# Patient Record
Sex: Male | Born: 1935 | Race: White | Hispanic: No | Marital: Married | Smoking: Never smoker
Health system: Southern US, Community
[De-identification: ages and names within clinical notes are randomized; demographics above are authoritative.]

## PROBLEM LIST (undated history)

## (undated) DIAGNOSIS — I509 Heart failure, unspecified: Secondary | ICD-10-CM

## (undated) DIAGNOSIS — Z95810 Presence of automatic (implantable) cardiac defibrillator: Secondary | ICD-10-CM

## (undated) DIAGNOSIS — I420 Dilated cardiomyopathy: Secondary | ICD-10-CM

## (undated) DIAGNOSIS — I2510 Atherosclerotic heart disease of native coronary artery without angina pectoris: Secondary | ICD-10-CM

## (undated) HISTORY — DX: Heart failure, unspecified: I50.9

## (undated) HISTORY — DX: Atherosclerotic heart disease of native coronary artery without angina pectoris: I25.10

## (undated) HISTORY — DX: Dilated cardiomyopathy: I42.0

## (undated) HISTORY — DX: Presence of automatic (implantable) cardiac defibrillator: Z95.810

---

## 2007-11-18 ENCOUNTER — Ambulatory Visit: Payer: Self-pay | Admitting: Cardiology

## 2007-11-19 HISTORY — PX: OTHER SURGICAL HISTORY: SHX169

## 2007-12-04 ENCOUNTER — Ambulatory Visit: Payer: Self-pay | Admitting: Cardiology

## 2007-12-09 ENCOUNTER — Ambulatory Visit: Payer: Self-pay | Admitting: Cardiology

## 2007-12-14 ENCOUNTER — Inpatient Hospital Stay (HOSPITAL_BASED_OUTPATIENT_CLINIC_OR_DEPARTMENT_OTHER): Admission: RE | Admit: 2007-12-14 | Discharge: 2007-12-14 | Payer: Self-pay | Admitting: Internal Medicine

## 2007-12-14 ENCOUNTER — Ambulatory Visit: Payer: Self-pay | Admitting: Internal Medicine

## 2007-12-17 ENCOUNTER — Ambulatory Visit: Payer: Self-pay | Admitting: Internal Medicine

## 2007-12-17 HISTORY — PX: CARDIAC CATHETERIZATION: SHX172

## 2007-12-17 LAB — CONVERTED CEMR LAB
Basophils Absolute: 0 10*3/uL (ref 0.0–0.1)
Chloride: 104 meq/L (ref 96–112)
Eosinophils Absolute: 0.5 10*3/uL (ref 0.0–0.6)
GFR calc Af Amer: 95 mL/min
GFR calc non Af Amer: 78 mL/min
HCT: 44.8 % (ref 39.0–52.0)
MCHC: 34.1 g/dL (ref 30.0–36.0)
MCV: 89.4 fL (ref 78.0–100.0)
Monocytes Absolute: 0.6 10*3/uL (ref 0.2–0.7)
Neutro Abs: 3.3 10*3/uL (ref 1.4–7.7)
Neutrophils Relative %: 44.8 % (ref 43.0–77.0)
Potassium: 4.2 meq/L (ref 3.5–5.1)
RBC: 5.01 M/uL (ref 4.22–5.81)
Sodium: 140 meq/L (ref 135–145)
aPTT: 30.9 s — ABNORMAL HIGH (ref 21.7–29.8)

## 2007-12-23 ENCOUNTER — Ambulatory Visit (HOSPITAL_COMMUNITY): Admission: RE | Admit: 2007-12-23 | Discharge: 2007-12-24 | Payer: Self-pay | Admitting: Internal Medicine

## 2007-12-23 ENCOUNTER — Ambulatory Visit: Payer: Self-pay | Admitting: Internal Medicine

## 2008-01-06 ENCOUNTER — Ambulatory Visit: Payer: Self-pay

## 2008-01-27 ENCOUNTER — Ambulatory Visit: Payer: Self-pay | Admitting: Internal Medicine

## 2008-02-05 ENCOUNTER — Ambulatory Visit: Payer: Self-pay | Admitting: Internal Medicine

## 2008-04-05 ENCOUNTER — Ambulatory Visit: Payer: Self-pay | Admitting: Internal Medicine

## 2008-07-05 ENCOUNTER — Ambulatory Visit: Payer: Self-pay | Admitting: Internal Medicine

## 2008-07-20 ENCOUNTER — Ambulatory Visit: Payer: Self-pay | Admitting: Cardiology

## 2008-10-06 ENCOUNTER — Ambulatory Visit: Payer: Self-pay | Admitting: Internal Medicine

## 2008-12-28 ENCOUNTER — Encounter: Payer: Self-pay | Admitting: Internal Medicine

## 2009-01-10 ENCOUNTER — Ambulatory Visit: Payer: Self-pay | Admitting: Internal Medicine

## 2009-04-11 ENCOUNTER — Ambulatory Visit: Payer: Self-pay | Admitting: Internal Medicine

## 2009-05-05 ENCOUNTER — Encounter: Payer: Self-pay | Admitting: Internal Medicine

## 2009-07-11 ENCOUNTER — Ambulatory Visit: Payer: Self-pay | Admitting: Internal Medicine

## 2009-07-26 ENCOUNTER — Encounter: Payer: Self-pay | Admitting: Internal Medicine

## 2009-10-10 ENCOUNTER — Ambulatory Visit: Payer: Self-pay | Admitting: Internal Medicine

## 2009-10-25 ENCOUNTER — Encounter: Payer: Self-pay | Admitting: Internal Medicine

## 2009-12-19 ENCOUNTER — Ambulatory Visit: Payer: Self-pay | Admitting: Internal Medicine

## 2009-12-19 DIAGNOSIS — Z95810 Presence of automatic (implantable) cardiac defibrillator: Secondary | ICD-10-CM | POA: Insufficient documentation

## 2009-12-19 DIAGNOSIS — I428 Other cardiomyopathies: Secondary | ICD-10-CM | POA: Insufficient documentation

## 2009-12-19 DIAGNOSIS — I5022 Chronic systolic (congestive) heart failure: Secondary | ICD-10-CM | POA: Insufficient documentation

## 2010-03-20 ENCOUNTER — Encounter: Payer: Self-pay | Admitting: Internal Medicine

## 2010-03-30 ENCOUNTER — Encounter: Payer: Self-pay | Admitting: Internal Medicine

## 2010-06-21 ENCOUNTER — Ambulatory Visit: Payer: Self-pay | Admitting: Internal Medicine

## 2010-06-25 ENCOUNTER — Encounter: Payer: Self-pay | Admitting: Internal Medicine

## 2010-09-20 ENCOUNTER — Ambulatory Visit: Payer: Self-pay | Admitting: Internal Medicine

## 2010-10-18 ENCOUNTER — Encounter (INDEPENDENT_AMBULATORY_CARE_PROVIDER_SITE_OTHER): Payer: Self-pay | Admitting: *Deleted

## 2010-12-18 NOTE — Cardiovascular Report (Signed)
Summary: Office Visit Remote   Office Visit Remote   Imported By: Roderic Ovens 06/25/2010 14:51:07  _____________________________________________________________________  External Attachment:    Type:   Image     Comment:   External Document

## 2010-12-18 NOTE — Cardiovascular Report (Signed)
Summary: Office Visit Remote   Office Visit Remote   Imported By: Roderic Ovens 04/06/2010 15:32:44  _____________________________________________________________________  External Attachment:    Type:   Image     Comment:   External Document

## 2010-12-18 NOTE — Letter (Signed)
Summary: Remote Device Check  [facility], Main Office  [address]   [place], [place] [address]   Phone: [phone]  Fax: [phone]     October 18, 2010 MRN: [id number]   [name] [address] [place], [place]  [address]   Dear Mr. FORTSON,   Your remote transmission was recieved and reviewed by your physician.  All diagnostics were within normal limits for you.  _____Your next transmission is scheduled for:                       .  Please transmit at any time this day.  If you have a wireless device your transmission will be sent automatically.  _X_____Your next office visit is scheduled for:February 2012 with Dr. Ladona Ridgel.                              . Please call our office to schedule an appointment.    Sincerely,  Altha Harm, LPN

## 2010-12-18 NOTE — Letter (Signed)
Summary: Remote Device Check  [facility], Main Office  [address]   [place], [place] [address]   Phone: [phone]  Fax: [phone]     Mar 30, 2010 MRN: [id number]   [name] [address] [place], [place]  [address]   Dear [name],   Your remote transmission was recieved and reviewed by your physician.  All diagnostics were within normal limits for you.  __X___Your next transmission is scheduled for:  06-21-10.  Please transmit at any time this day.  If you have a wireless device your transmission will be sent automatically.   Sincerely,  Vella Kohler

## 2010-12-18 NOTE — Assessment & Plan Note (Signed)
Summary: defib check.medtronic.amber  Medications Added ASPIRIN 81 MG TBEC (ASPIRIN) Take one tablet by mouth daily LISINOPRIL 5 MG TABS (LISINOPRIL) Take one tablet by mouth daily METOPROLOL TARTRATE 25 MG TABS (METOPROLOL TARTRATE) Take one tablet by mouth twice a day LEVOTHYROXINE SODIUM 50 MCG TABS (LEVOTHYROXINE SODIUM) Take one tablet by mouth once daily.      Allergies Added: NKDA  Visit Type:  Follow-up Primary Provider:  Fara Chute, MD  CC:  Sting in chaest at ICD area.  History of Present Illness: [name] returns today for followup.  He is a 77 yo man with a non-ischemic CM, class 2 CHF, and frequent PVC's.  He denies sob, peripheral edema or c/p.  No ICD shocks.  He has an occaisional stinging sensation in his left lower chest.  No other complaints.  Current Medications (verified): 1)  Aspirin 81 Mg Tbec (Aspirin) .... Take One Tablet By Mouth Daily 2)  Lisinopril 5 Mg Tabs (Lisinopril) .... Take One Tablet By Mouth Daily 3)  Metoprolol Tartrate 25 Mg Tabs (Metoprolol Tartrate) .... Take One Tablet By Mouth Twice A Day 4)  Levothyroxine Sodium 50 Mcg Tabs (Levothyroxine Sodium) .... Take One Tablet By Mouth Once Daily.  Allergies (verified): No Known Drug Allergies  Past History:  Past Surgical History: Last updated: 12/16/2008 S/P ICD 2009  Past Medical History: DCM Minimal CAD by catheterization Sinus bradycardia Class 2 CHF  IMPLANTATION OF DEFIBRILLATOR, HX OF (ICD-V45.02)    Review of Systems  The patient denies chest pain, syncope, dyspnea on exertion, and peripheral edema.    Vital Signs:  Patient profile:   77 year old male Height:      75 inches Weight:      170 pounds BMI:     21.33 Pulse rate:   60 / minute BP sitting:   124 / 70  (left arm)  Vitals Entered By: [name] CMA (December 19, 2009 4:00 PM)  Physical Exam  General:  Well developed, well nourished, in no acute distress.  HEENT: normal Neck: supple. No JVD. Carotids  2+ bilaterally no bruits Cor:I RRR no rubs, gallops or murmur Lungs: CTA. No wheezes, rales, or rhonchi. Ab: soft, nontender. nondistended. No HSM. Good bowel sounds Ext: warm. no cyanosis, clubbing or edema Neuro: alert and oriented. Grossly nonfocal. affect pleasant     ICD Specifications Following MD:  Eric Bunting, MD     ICD Vendor:  Medtronic     ICD Model Number:  D154ATG     ICD Serial Number:  [id number] H ICD DOI:  12/23/2007      Lead 1:    Location: RA     DOI: 12/23/2007     Model #: [id number]     Serial #: [id number]     Status: active Lead 2:    Location: RV     DOI: 12/23/2007     Model #: [id number]     Serial #: [id number]     Status: active  Indications::  NICM  Explantation Comments: Carelink  ICD Follow Up Remote Check?  No Battery Voltage:  3.13 V     Charge Time:  9.1 seconds     Underlying rhythm:  SR/PVC's ICD Dependent:  No       ICD Device Measurements Atrium:  Amplitude: 1.7 mV, Impedance: 592 ohms, Threshold: 1.0 V at 0.4 msec Right Ventricle:  Amplitude: 4.1 mV, Impedance: 456 ohms, Threshold: 0.75 V at 0.4 msec Shock Impedance: 43/54 ohms   Episodes MS  Episodes:  0     Percent Mode Switch:  0     Coumadin:  No Shock:  0     ATP:  0     Nonsustained:  19     Atrial Pacing:  68.8%     Ventricular Pacing:  3.5%  Brady Parameters Mode DDDR+     Lower Rate Limit:  60     Upper Rate Limit 120 PAV 180     Sensed AV Delay:  150  Tachy Zones VF:  200     VT:  176     Next Remote Date:  03/20/2010     Next Cardiology Appt Due:  12/26/2010 Tech Comments:   FFRW noted, atrial sensitivity reprogrammed 0.30mV.  Carelink transmissions every 3 months ROV 1 year with Dr. Ladona Harmon. Eric Harm, LPN  December 19, 2009 4:21 PM   Impression & Recommendations:  Problem # 1:  IMPLANTATION OF DEFIBRILLATOR, HX OF (ICD-V45.02) Assessment Comment Only His device is working normally.  Continue followup in several months.  Problem # 2:  CHRONIC SYSTOLIC HEART FAILURE  (ICD-428.22) He is class 2 and is on a good medical regimen. I have asked that he reduce his sodium intake. His updated medication list for this problem includes:    Aspirin 81 Mg Tbec (Aspirin) .Marland Kitchen... Take one tablet by mouth daily    Lisinopril 5 Mg Tabs (Lisinopril) .Marland Kitchen... Take one tablet by mouth daily    Metoprolol Tartrate 25 Mg Tabs (Metoprolol tartrate) .Marland Kitchen... Take one tablet by mouth twice a day

## 2010-12-18 NOTE — Letter (Signed)
Summary: Remote Device Check  [facility], Main Office  [address]   [place], [place] [address]   Phone: [phone]  Fax: [phone]     June 25, 2010 MRN: [id number]   TICO CROTTEAU [address] [place], [place]  [address]   Dear Mr. MCNEAL,   Your remote transmission was recieved and reviewed by your physician.  All diagnostics were within normal limits for you.  __X___Your next transmission is scheduled for: 09-20-2010.  Please transmit at any time this day.  If you have a wireless device your transmission will be sent automatically.   Sincerely,  Vella Kohler

## 2010-12-25 ENCOUNTER — Encounter (INDEPENDENT_AMBULATORY_CARE_PROVIDER_SITE_OTHER): Payer: Medicare Other | Admitting: Internal Medicine

## 2010-12-25 ENCOUNTER — Encounter: Payer: Self-pay | Admitting: Internal Medicine

## 2010-12-25 DIAGNOSIS — I5022 Chronic systolic (congestive) heart failure: Secondary | ICD-10-CM

## 2010-12-25 DIAGNOSIS — I472 Ventricular tachycardia, unspecified: Secondary | ICD-10-CM

## 2010-12-25 DIAGNOSIS — I10 Essential (primary) hypertension: Secondary | ICD-10-CM

## 2010-12-25 DIAGNOSIS — I4729 Other ventricular tachycardia: Secondary | ICD-10-CM

## 2011-01-03 NOTE — Assessment & Plan Note (Signed)
Summary: DEFIB CHECK=MJ      Allergies Added: NKDA  Visit Type:  Follow-up Primary Provider:  Fara Chute, MD   History of Present Illness: [name] returns today for followup.  He is a 77 yo man with a non-ischemic CM, class 2 CHF, and frequent PVC's.  He denies sob, peripheral edema or c/p.  No ICD shocks.  He initially had a large pocket hematoma after ICD implant which resolved.   No other complaints. He has been able to perform his usual activities without difficulty.  Allergies (verified): No Known Drug Allergies  Past History:  Past Medical History: Last updated: 12/19/2009 DCM Minimal CAD by catheterization Sinus bradycardia Class 2 CHF  IMPLANTATION OF DEFIBRILLATOR, HX OF (ICD-V45.02)    Past Surgical History: Last updated: Jan 09, 2009 S/P ICD 2009  Family History: Last updated: 09-Jan-2009 Mother died at 45  Father died at 70 of TB  Social History: Last updated: 09-Jan-2009 Married Retired No ETOH or Tobacco  Review of Systems  The patient denies chest pain, syncope, dyspnea on exertion, and peripheral edema.    Vital Signs:  Patient profile:   77 year old male Height:      75 inches Weight:      173 pounds BMI:     21.70 Pulse rate:   73 / minute BP sitting:   162 / 88  (left arm)  Vitals Entered By: Laurance Flatten CMA (December 25, 2010 10:26 AM)  Physical Exam  General:  Well developed, well nourished, in no acute distress.  HEENT: normal Neck: supple. No JVD. Carotids 2+ bilaterally no bruits Cor:I RRR no rubs, gallops or murmur Lungs: CTA. No wheezes, rales, or rhonchi. Ab: soft, nontender. nondistended. No HSM. Good bowel sounds Ext: warm. no cyanosis, clubbing or edema Neuro: alert and oriented. Grossly nonfocal. affect pleasant     ICD Specifications Following MD:  Lewayne Bunting, MD     ICD Vendor:  Medtronic     ICD Model Number:  D154ATG     ICD Serial Number:  [id number] H ICD DOI:  12/23/2007      Lead 1:    Location: RA      DOI: 12/23/2007     Model #: 5076     Serial #: [id number]     Status: active Lead 2:    Location: RV     DOI: 12/23/2007     Model #: 1308     Serial #: [id number]     Status: active  Indications::  NICM  Explantation Comments: Carelink  ICD Follow Up Battery Voltage:  3.02 V     Charge Time:  9.7 seconds     Underlying rhythm:  SB  ICD Dependent:  No       ICD Device Measurements Atrium:  Amplitude: 1.8 mV, Impedance: 504 ohms, Threshold: 0.5 V at 0.40 msec Right Ventricle:  Amplitude: 3.8 mV, Impedance: 416 ohms, Threshold: 0.5 V at 0.40 msec Shock Impedance: 44/53 ohms   Episodes MS Episodes:  0     Coumadin:  No Shock:  0     ATP:  0     Nonsustained:  9     Atrial Therapies:  0 Atrial Pacing:  75.5%     Ventricular Pacing:  1.7%  Brady Parameters Mode MVP     Lower Rate Limit:  60     Upper Rate Limit 120 PAV 180     Sensed AV Delay:  150  Tachy Zones VF:  200  VT:  176     Next Remote Date:  03/28/2011     Next Cardiology Appt Due:  12/20/2011 Tech Comments:  9 NST EPISODES--ALL 1 SECOND LONG. NORMAL DEVICE FUNCTION.  NO CHANGES MADE. CARELINK 03-28-11 AND ROV W/GT IN 12 MTHS. Vella Kohler  December 25, 2010 10:31 AM MD Comments:  Agree with above.  Impression & Recommendations:  Problem # 1:  IMPLANTATION OF DEFIBRILLATOR, HX OF (ICD-V45.02) His device is working normally. Will recheck in several months.  Problem # 2:  CHRONIC SYSTOLIC HEART FAILURE (ICD-428.22) His symptoms remain class 2. I have asked him to continue his current meds and maintain a low sodium diet. His updated medication list for this problem includes:    Aspirin 81 Mg Tbec (Aspirin) .Marland Kitchen... Take one tablet by mouth daily    Lisinopril 5 Mg Tabs (Lisinopril) .Marland Kitchen... Take one tablet by mouth daily    Metoprolol Tartrate 25 Mg Tabs (Metoprolol tartrate) .Marland Kitchen... Take one tablet by mouth twice a day  Patient Instructions: 1)  Your physician wants you to follow-up in: 12 months with Dr Court Joy will  receive a reminder letter in the mail two months in advance. If you don't receive a letter, please call our office to schedule the follow-up appointment. 2)  Your physician recommends that you continue on your current medications as directed. Please refer to the Current Medication list given to you today.

## 2011-01-24 NOTE — Cardiovascular Report (Signed)
Summary: Office Visit   Office Visit   Imported By: Roderic Ovens 01/14/2011 16:01:46  _____________________________________________________________________  External Attachment:    Type:   Image     Comment:   External Document

## 2011-02-04 ENCOUNTER — Telehealth: Payer: Self-pay | Admitting: Internal Medicine

## 2011-02-14 NOTE — Progress Notes (Signed)
Summary: Calling about paper work   Phone Note Call from Patient Call back at [employer] [phone]   Caller: Patient Summary of Call: Calling about having letter written to his insurance company Initial call taken by: Judie Grieve,  February 04, 2011 10:46 AM  Follow-up for Phone Call        spoke with pt One week ago pt he filed for more life insurance and was turned down.  Said that on 12/25/2010 he had CHF in our office.  I explained to pt that this is all part of his disease process.  his heart is weak and that is why he has as ICD.  He verbalized understanding. Eric Bast, RN, BSN  February 04, 2011 1:35 PM

## 2011-03-28 ENCOUNTER — Ambulatory Visit (INDEPENDENT_AMBULATORY_CARE_PROVIDER_SITE_OTHER): Payer: Medicare Other | Admitting: *Deleted

## 2011-03-28 ENCOUNTER — Other Ambulatory Visit: Payer: Self-pay | Admitting: Internal Medicine

## 2011-03-28 DIAGNOSIS — Z95810 Presence of automatic (implantable) cardiac defibrillator: Secondary | ICD-10-CM

## 2011-03-28 DIAGNOSIS — I428 Other cardiomyopathies: Secondary | ICD-10-CM

## 2011-03-28 DIAGNOSIS — I5022 Chronic systolic (congestive) heart failure: Secondary | ICD-10-CM

## 2011-04-01 NOTE — Progress Notes (Signed)
icd remote check  

## 2011-04-02 NOTE — Cardiovascular Report (Signed)
[facility]                   [place] ELECTROPHYSIOLOGY DEVICE CLINIC NOTE   [name], [name]                      MRN:          [id number]  DATE:02/05/2008                            DOB:          1934-06-26    HISTORY OF PRESENT ILLNESS:  [name] is a patient about 6 weeks  status post ICD implantation for prime prevention undertaken by Dr.  Ladona Ridgel.  About 2 weeks ago he was seen in the office in [place], at  which time there was removal of a retained suture.  He comes in today  for follow-up.  There is bout a 0.25 cm diameter lesion at the lateral  aspect which is much improved from his previous size.  The scab was  removed and the wound was explored and there was nothing to be seen.  However, it was also noted that there was overlying erythema and some  warmth on the face of the pocket.   I have put him on Keflex 250 q. 8 times one week.  I will see him again  in two weeks time at [facility].  Interpolating myself into Dr. Lubertha Basque  follow-up because I will be available to see him in 4 weeks in [place]  again if need be.   He is alerted to contact us if there is significant symptoms of fever,  chills.     Duke Salvia, MD, [facility]  Electronically Signed    SCK/MedQ  DD: 02/05/2008  DT: 02/06/2008  Job #: [id number]

## 2011-04-02 NOTE — Assessment & Plan Note (Signed)
[facility]                         ELECTROPHYSIOLOGY OFFICE NOTE   [name], [name]                      MRN:          [id number]  DATE:12/17/2007                            DOB:          09/05/1934    [name] referred today by Dr. Lewayne Bunting for consideration for  prophylactic ICD insertion.  The patient is a very pleasant 77 year old  man who has congestive heart failure class II who also has a history of  palpitations.  Subsequent workup, today, has demonstrated an echo with  an EF of 20%.  He has frequent PVCs.  The patient has never had syncope.  Catheterization was carried out secondary to a severe LV dysfunction and  ventricular ectopy demonstrating no obstructive coronary disease of any  significance.   Additional past medical history is otherwise unremarkable.   MEDICATIONS INCLUDE:  Aspirin a day.  He is also on lisinopril 10 mg  daily.  He has been unable take beta blockers secondary to his  bradycardia.   FAMILY HISTORY:  Noncontributory.   SOCIAL HISTORY:  The patient is retired.  He is married, lives in [place].  He denies tobacco use.   REVIEW OF SYSTEMS:  Negative except as noted in the HPI.   PHYSICAL EXAMINATION:  He is a pleasant well-appearing 77 year old man  in no distress.  Blood pressure was 124/81, pulse was 54 and irregular, respirations were  18.  The weight was 174 pounds.  HEENT:  Exam normocephalic and atraumatic.  Pupils are equal and round.  Oropharynx is moist.  Sclerae anicteric.  NECK:  Revealed no jugular distention.  There is no thyromegaly.  Trachea is midline.  Carotids are 2+ symmetric.  LUNGS:  Clear bilaterally to auscultation. No wheezes, rales, or  rhonchi.  There is no increased work of breathing.  CARDIAC EXAM:  Revealed an irregular rhythm with a normal S1-S2.  The  PMI was enlarged and laterally displaced.  ABDOMINAL EXAM:  Soft, nontender, nondistended.  There is no  organomegaly.  Bowel  sounds present.  No rebound or guarding.  EXTREMITIES:  Demonstrate no cyanosis, clubbing, or edema.  NEUROLOGIC:  Alert and oriented x3.  Cranial nerves II-XII intact.  Strength is 5/5 and symmetric.  SKIN:  Exam was normal.   IMPRESSION:  1. Nonischemic cardiomyopathy.  2. Dense ventricular ectopy.  3. Sinus bradycardia.  4. Congestive heart failure.   DISCUSSION:  I have discussed the treatment options with [name] and  his brother in detail.  The risks, benefits, goals, and expectations of  prophylactic defibrillator insertion were discussed with the patient who  would like to proceed.  The patient will be scheduled the earliest  possible convenient time.     Doylene Canning. Ladona Ridgel, MD  Electronically Signed    GWT/MedQ  DD: 12/17/2007  DT: 12/17/2007  Job #: [id number]   cc:   Learta Codding, MD,FACC  Fara Chute

## 2011-04-02 NOTE — Discharge Summary (Signed)
NAMEJUMAR, GREENSTREET               ACCOUNT NO.:  [id number]   MEDICAL RECORD NO.:  [id number]          PATIENT TYPE:  OIB   LOCATION:  2852                         FACILITY:  [facility]   PHYSICIAN:  Maple Mirza, PA   DATE OF BIRTH:  February 10, 1934   DATE OF ADMISSION:  12/23/2007  DATE OF DISCHARGE:  12/24/2007                               DISCHARGE SUMMARY   ADDENDUM:  The quality care personnel noted that the patient had been  admitted on lisinopril and ACE inhibitor and was discharged not on  lisinopril but on metoprolol.  The reason for this is that the patient  has a dense ventricular ectopy and a tendency for bradycardia as well as  hypotension.  He was on lisinopril for his blood pressure coming in;  however, after a defibrillator was implanted and he had pacer control,  he was placed on metoprolol to help reduce the amount and severity of  his ventricular ectopy, and because of this, his lisinopril then was  held so as not to give the patient a surcease of hypotension.  So the  ACE inhibitor has been held this admission, 12/24/2007 discharge, for  hypotension when replacing it with a medication, which is needed for  rhythm control.  Metoprolol b.i.d.      Maple Mirza, PA     GM/MEDQ  D:  12/25/2007  T:  12/26/2007  Job:  [id number]

## 2011-04-02 NOTE — Discharge Summary (Signed)
NAMEKERWIN, [name]               ACCOUNT NO.:  [id number]   MEDICAL RECORD NO.:  [id number]          PATIENT TYPE:  INP   LOCATION:  6525                         FACILITY:  [facility]   PHYSICIAN:  Doylene Canning. Ladona Ridgel, MD    DATE OF BIRTH:  [birth year]   DATE OF ADMISSION:  12/23/2007  DATE OF DISCHARGE:  12/24/2007                               DISCHARGE SUMMARY   This patient has no known drug allergies.   Dictation and exam greater than 35 minutes.   FINAL DIAGNOSIS:  Discharging day one status post implant of a Medtronic  EnTrust dual chamber cardioverter defibrillator.   SECONDARY DIAGNOSES:  1. New York Heart Association Class II mixed congestive heart failure.      a.     Echocardiogram December 17, 2007.  Ejection fraction 20%.      b.     Left heart catheterization December 14, 2007.  Nonobstructive       coronary artery disease.      c.     NICM.  2. High percentage of ventricular ectopy/NSVT.  3. Bradycardia.   PROCEDURE:  December 23, 2007:  Implant of a Medtronic EnTrust dual  chamber cardioverter defibrillator.  Defibrillator threshold study less  than or equal to 15 joules.  Dr. Lewayne Bunting.  The patient has had no  post procedural complications.  His incision is not even sore and  certainly not swollen or ecchymotic.  The device has been interrogated.  All values within normal limits.  Chest x-ray showed leads in  appropriate position and no pneumothorax.  The lungs are clear.  He has  followup appointments.  He has been started on metoprolol for his dense  ventricular ectopy and has actually seen in the overnight a decrease in  PVCs and NSVT.   BRIEF HISTORY:  [name] is a 77 year old male.  He was referred by  Dr. Andee Lineman for consideration of prophylactic ICD insertion.  The patient  has New York Heart Association Class II heart failure and palpitations.  The patient had an echocardiogram with an ejection fraction of 20%.  He  has frequent PVCs.  The patient has  never had syncope.  A left heart  catheterization shows severe left ventricular dysfunction and  nonobstructive coronary disease.  The patient has also a tendency for  bradycardia.  The risks and benefits of implantation of ICD have been  discussed with the patient, and he wishes to proceed.   HOSPITAL COURSE:  The patient presented electively December 23, 2007.  He  underwent implantation of the Medtronic device the same day.  Defibrillator threshold study was successful at less than or equal to 15  joules, the patient discharging post procedure day number one.  He has  been started on metoprolol 25 mg twice daily with decrease in ectopy.  The patient has A pacing at the time of discharge, 60 beats a minute.  Additional note:  The patient did have a stress Myoview study December 04, 2007.  The patient exercised for a total of 4.43 minutes.  Maximum  heart rate was 135.  LABORATORY DATA:  Laboratory studies pertinent to this admission drawn  January 29th:  Complete blood count showed white cells 7.3, hemoglobin  15.3, hematocrit 44.8, platelets 266.  Pro time 11.5, INR 0.9, sodium  140, potassium 4.2, chloride 104, bicarbonate 30, glucose 91, BUN 11,  creatinine 1.      Maple Mirza, PA      Doylene Canning. Ladona Ridgel, MD  Electronically Signed    GM/MEDQ  D:  12/24/2007  T:  12/24/2007  Job:  [id number]   cc:   Learta Codding, MD,FACC  Doylene Canning. Ladona Ridgel, MD  Fara Chute

## 2011-04-02 NOTE — Assessment & Plan Note (Signed)
[facility]                         ELECTROPHYSIOLOGY OFFICE NOTE   [name], [name]                      MRN:          [id number]  DATE:01/10/2009                            DOB:          1934/01/07    [name] returns today for followup.  He is a very pleasant male with  a nonischemic cardiomyopathy, congestive heart failure, and sinus  bradycardia who underwent dual-chamber ICD insertion back in 2009.  He  returns today for his yearly followup.  He is anxious about what he can  and cannot do.  He notes that sometimes when he is exerting himself, he  will start sweating and he should stop right away.  He does continue to  have a problem with cough.  I have recommended an ARB because of his ACE  inhibitor I suspect was behind his cough, but he did not want to pay for  the extra cost and is now taking very low-dose lisinopril with  improvement but still presence of his cough.   MEDICATIONS:  1. Aspirin 81 a day.  2. He is on metoprolol 50 mg half tablet twice daily.  3. Lisinopril 5 a day.   PHYSICAL EXAMINATION:  GENERAL:  He is a pleasant well-appearing man in  no acute distress.  VITAL SIGNS:  Blood pressure today was 114/73, the pulse was 60 and  regular, respirations were 18, the weight was 175 pounds. NECK:  No  jugular venous distention.  LUNGS:  Clear bilaterally to auscultation.  No wheezes, rales, or  rhonchi are present.  There is no increased work of breathing.  CARDIOVASCULAR:  Regular rate and rhythm.  Normal S1 and S2.  There are  no murmurs, rubs, or gallops.  ABDOMEN:  Soft, nontender.  EXTREMITIES:  No edema.   Interrogation of his defibrillator demonstrates a Medtronic Virtuoso.  P  and R waves were 2 and 4 respectively.  The impedance 560 in the A and  440 in the V.  The threshold 0.5 at 0.4 in the A and 1 at 0.4 in the RV.  Battery voltage was 3.16 volts.  Underlying rhythm was sinus PVCs.  He  was 74% A paced.   IMPRESSION:  1. Symptomatic bradycardia.  2. Nonischemic cardiomyopathy.  3. Status post implantable cardioverter-defibrillator insertion.   DISCUSSION:  [name] is stable and his defibrillator is working  normally.  His heart failure is class II.  I have encouraged him to  maintain a normal activity.  I have also given a prescription  for Tessalon Perles today to help with his cough.  Hopefully, we can  switch him off of his ACE inhibitor in the future to an ARB.     Doylene Canning. Ladona Ridgel, MD  Electronically Signed    GWT/MedQ  DD: 01/10/2009  DT: 01/11/2009  Job #: [id number]   cc:   Fara Chute

## 2011-04-02 NOTE — Cardiovascular Report (Signed)
NAME:  [name], [name]               ACCOUNT NO.:  [id number]   MEDICAL RECORD NO.:  [id number]          PATIENT TYPE:  OIB   LOCATION:  1966                         FACILITY:  [facility]   PHYSICIAN:  Bevelyn Buckles. Bensimhon, MDDATE OF BIRTH:  [birth year]   DATE OF PROCEDURE:  12/14/2007  DATE OF DISCHARGE:                            CARDIAC CATHETERIZATION   REFERRING PHYSICIAN:  Learta Codding, M.D., [facility]   PRIMARY CARE PHYSICIAN:  Fara Chute, M.D.   PATIENT IDENTIFICATION:  [name] is a delightful 77 year old male  with history of bigeminy.  He has developed exertional dyspnea with NYHA  class II symptoms.  Echocardiogram showed markedly depressed LV function  with an EF in the 20% range.  He is referred for right and left heart  cath to rule out ischemic etiology.   This was done in the outpatient catheterization lab.   PROCEDURES PERFORMED:  1. Selective coronary angiography.  2. Left heart cath.  3. Left ventriculogram.  4. Right heart cath.   DESCRIPTION OF PROCEDURE:  The risks and indications of catheterization  were reviewed.  Consent was signed and placed on the chart.  A 7-French  venous sheath was placed in the right femoral vein, and a standard Swan-  Ganz catheter was used.  A 4-French arterial sheath was placed in the  right femoral artery, and standard catheters including JL-4, 3, RC, and  angled pigtail were used for the procedure.  All catheter exchanges were  made over a wire.  No apparent complications.   Right atrial pressure mean of 4, RV 28/3.  PA 30/8 with mean of 19.  Pulmonary capillary wedge pressure had a mean of 14.  Central aortic  pressure was 139/68 with a mean of 99.  LV pressure 133/5 with an EDP of  13.  Fick cardiac output was 4.6 liters per minute.  Cardiac index was  2.2 liters per minute per meter squared.  Pulmonary vascular resistance  was 1.0 Woods units.  There was no aortic stenosis or mitral stenosis.   Left main had a minor  irregularity in the ostial portion.   The LAD was a long vessel wrapping the apex.  It gave off a moderate-to-  large first diagonal and two small distal diagonals.  There is a 40%  lesion in the mid-LAD in the proximal portion of the first diagonal.  There is a 50% stenosis.   Left circumflex gave off a moderate size ramus branch, a large OM-1 and  a small OM-2.  There was a 60-70% lesion in the midportion of the large  OM-1.   The right coronary artery was a large dominant vessel.  It gave off a  PDA and a posterolateral.  There was a 30-40% lesion in the proximal  portion of the RCA.   The left ventricle was dilated and severely globally hypokinetic with an  EF of 15-20%.  There appeared to be a very mild mitral regurgitation,  but this was not seen well, as LV was not fully opacified.   ASSESSMENT:  1. Nonobstructive coronary disease with borderline lesion in the first  obtuse marginal, as described above.  2. Severe left ventricular dysfunction well out of proportion to the      degree of coronary artery disease.   PLAN/DISCUSSION:  [name] obviously has a nonischemic cardiomyopathy.  His hemodynamics are well-compensated.  I do not believe that his OM-1  lesion is hemodynamically significant.  However, I will review it with  colleagues.  For now, he will need aggressive medical therapy.  He is  being referred to electrophysiology for consideration of a  defibrillator.      Bevelyn Buckles. Bensimhon, MD  Electronically Signed     DRB/MEDQ  D:  12/14/2007  T:  12/14/2007  Job:  [id number]   cc:   Fara Chute

## 2011-04-02 NOTE — Assessment & Plan Note (Signed)
[facility]                         ELECTROPHYSIOLOGY OFFICE NOTE   [name], [name]                      MRN:          [id number]  DATE:04/05/2008                            DOB:          December 24, 1933    [name] returns today for follow-up.  He is a very pleasant man with  a history of nonischemic cardiomyopathy, congestive heart failure status  post ICD insertion who returns today for follow-up.  His only complaint  today is that he has been coughing a lot, and he gets tired and short of  breath when he exerts himself.   His medications include:  1. Aspirin 81 a day.  2. Lisinopril 10 a day.  3. Metoprolol 25 twice daily.   On physical examination, he is a pleasant, well-appearing, middle-aged  man in no distress.  Blood pressure 120/87. The pulse was 100 and  regular.  Respirations were 18.  Weight was not recorded.  NECK:  Revealed 7-cm jugular venous distention.  LUNGS:  Were clear bilaterally to auscultation.  No wheezes, rales or  rhonchi are present.  CARDIOVASCULAR EXAM:  Revealed a regular rate and rhythm.  Normal S1-S2.  There was soft S4 gallop present.  EXTREMITIES:  Demonstrated no edema.    His ICD insertion site was healed nicely.   Interrogation of his defibrillator demonstrates Medtronic Entrust.  The  P and R waves were 2 and 4, respectively.  The impedance 520 in the  atrium, 416 in the ventricle, threshold 0.5 at 0.4 in the A and 1 at 0.4  in the RV.  Battery voltage 3.2 volts.  He was 69% A paced, 4% V paced.   IMPRESSION:  1. Nonischemic cardiomyopathy, ejection fraction 25-30%.  2. Congestive heart failure class II.  3. Status post ICD insertion.  4. Sinus bradycardia.  5. Cough.   DISCUSSION:  [name] is stable, but I am concerned that this cough  may be related to ACE inhibitors.  For this reason, I have asked that he  stop his lisinopril for approximately three  weeks.  If the cough goes away, then I  have given him a prescription for  Cozaar 50 a day.  If his  cough is no better, then he is to restart his lisinopril, and we will  have him back at the office for additional follow-up after this.     Doylene Canning. Ladona Ridgel, MD  Electronically Signed    GWT/MedQ  DD: 04/05/2008  DT: 04/05/2008  Job #: [id number]

## 2011-04-02 NOTE — Assessment & Plan Note (Signed)
[facility]                          [facility] NOTE   [name], [name]                      MRN:          [id number]  DATE:11/18/2007                            DOB:          [birth year]    REASON FOR VISIT:  Evaluation of bradycardia and bigeminy.   HISTORY OF PRESENT ILLNESS:  The patient is a 77 year old, white male  with no significant prior cardiovascular history.  The patient  reportedly saw Dr. Neita Carp for routine checkup and was found to be in  ventricular bigeminy.  According to Dr. Dian Situ note, the patient,  however, reported no symptoms of dyspnea, chest pain, weakness or  dizziness.  He also reported no syncope.  EKG has been provided to me  from Dr. Dian Situ office which demonstrates normal sinus rhythm with  ventricular bigeminy.  The rate is 62 beats per minute, but PVCs are  nonconducted on physical exam of the patient's pulse.  Indeed, the  patient reports to me no significant symptoms of presyncope or syncope.  He does state that over the last year or so, he has been getting some  increasing fatigue and particularly when going up hill, he gives out.  He does describe episodes sometimes of difficulty with his balance.  Denies any substernal chest pain.  He has no orthopnea or PND.  He has  no palpitations.   ALLERGIES:  No known drug allergies.   MEDICATIONS:  Aspirin 81 mg p.o. daily.   SOCIAL HISTORY:  The patient lives in [place].  He is retired.  He does not  smoke.   FAMILY HISTORY:  Notable for mother dying of old age at 89.  Father died  at age 31 from tuberculosis.  He has a sister who died at age 52 from a  stroke and he has another living sister as well as another living  brother.  He also has a brother that died at 78 from lung problems.   REVIEW OF SYSTEMS:  No nausea, vomiting, fevers, chills.  No urgency or  frequency.  No neurological symptoms.  Remainder of review of systems is  negative.   PHYSICAL EXAMINATION:  VITAL SIGNS:  Blood pressure 140/92, heart rate  54 beats per minute.  GENERAL:  Slender, white male in no apparent distress.  NECK:  Normal carotid upstrokes, no carotid bruits.  LUNGS:  Clear breath sounds bilaterally.  HEART:  Regular rate and rhythm.  Normal S1, S2.  No murmurs, rubs or  gallops.  ABDOMEN:  Soft and nontender.  Good bowel sounds.  EXTREMITIES:  No cyanosis, clubbing or edema.  NEUROLOGIC:  The patient is alert and grossly nonfocal.   PROBLEM LIST:  1. Ventricular bigeminy asymptomatic.  2. Rule out bradycardia (normal heart rate by EKG, but nonconducted      premature ventricular contractions).  3. Rule out hypertension (elevated diastolic blood pressure).  4. Dyspnea.   PLAN:  1. The patient has evidence for ventricular bigeminy and is      asymptomatic.  He is also not bradycardic based on his EKG.  He  does have a slow pulse by physical examination due to nonconducted      PVCs.  2. Explore to evaluate the patient for chronotropic insufficiency as      well as ischemic heart disease as he does report giving out when      going up hill.  This could be secondary to chronotropic      insufficiency or underlying ischemic heart disease.  Will proceed      with exercise Cardiolite stress study.  If this study does      demonstrate chronotropic insufficiency, a Holter monitor will be      applied.  3. The patient also has been scheduled for an echocardiographic study      to rule out structural heart disease.  4. Final recommendations will be provided after review of the above      studies.     Learta Codding, MD,FACC  Electronically Signed    GED/MedQ  DD: 11/20/2007  DT: 11/20/2007  Job #: [id number]   cc:   Fara Chute

## 2011-04-02 NOTE — Letter (Signed)
December 17, 2007    Learta Codding, MD,FACC  [address]  [place], [place] [address]   RE:  CHIMAOBI, CASEBOLT  MRN:  [id number]  /  DOB:  02-19-1934   Dear Michelle Piper:   Thank you for referring Mr. Bachmann for EP evaluation consideration for  prophylactic ICD insertion.  As you know he is a very pleasant 77-year-  old man who is very active has symptomatic palpitations, and class II  heart failure with severe LV dysfunction, EF 20%.  He has recently  undergone catheterization demonstrating no significant obstructive  coronary disease.  The patient heart failure symptoms have been fairly  well-controlled.  He states that when he exerts himself vigorously, he  will get short of breath and have to stop what he is doing otherwise  been stable.  He also has evidence of bradycardia with frequent PVCs.   His physical exam was fairly unremarkable except for an irregular heart  rhythm.  EKG demonstrates sinus rhythm with frequent PVCs.   I discussed the options with the patient.  The risks, benefits, goals  expectations of prophylactic ICD insertion with backup pacing support  have been discussed with the patient.  He would like to proceed with  this.  This scheduled earliest possible convenient time.   Michelle Piper, thanks again for referring Mr. Krahl for EP evaluation.  I will  keep you apprised as to how he does.    Sincerely,      Doylene Canning. Ladona Ridgel, MD  Electronically Signed    GWT/MedQ  DD: 12/17/2007  DT: 12/17/2007  Job #: [id number]   CC:    Fara Chute

## 2011-04-02 NOTE — Op Note (Signed)
[name], [name]               ACCOUNT NO.:  [id number]   MEDICAL RECORD NO.:  [id number]          PATIENT TYPE:  INP   LOCATION:  6525                         FACILITY:  [facility]   PHYSICIAN:  Doylene Canning. Ladona Ridgel, MD    DATE OF BIRTH:  08/03/1934   DATE OF PROCEDURE:  12/23/2007  DATE OF DISCHARGE:                               OPERATIVE REPORT   PROCEDURE PERFORMED:  Implantation of a dual-chamber ICD.   INDICATION:  Nonischemic and ischemic cardiomyopathy with an EF of 25%,  Class 2 heart failure and symptomatic sinus bradycardia.   I. INTRODUCTION:  The patient is a 77 year old male with a history of a  mixed cardiomyopathy mostly nonischemic with Class 2 heart failure and  sinus bradycardia who is now referred for ICD implantation.   II. PROCEDURE:  After informed consent was obtained, the patient was  taken to the diagnostic EP lab in a fasting state.  After the usual  preparation and draping, intravenous fentanyl  and __________ was given  for sedation.  30 cc of lidocaine was infiltrated in the left  infraclavicular region.  A 7 cm incision was carried out over this  region.  Electrocautery was utilized to dissect down to the fascial  plane.  The left subclavian vein was subsequently punctured x2, and the  Medtronic 6947 65 cm active fixation defibrillation lead, serial number  [id number] was advanced through the right ventricle, and the Medtronic  Model 5076 52 cm active fixation pacing lead, serial number [id number]  was advanced through the right atrium.  Mapping was carried out in the  right ventricle at the final site on the RV septum.  The R-waves were 10  mV, and the pacing impedance was 825 ohms.  The threshold was 0.4 volts  at 0.5 msec.  With the left ventricular lead in satisfactory position,  and a large injury current demonstrated, attention was then turned to  placement of the atrial lead which was placed on the anterolateral wall  of the right atrium where  P-waves were initially 4 mV, and the pacing  impedance was 849 ohms.  Threshold was 0.6 volts at 0.5 msec.  A large  injury current was noted.  10-volt paced did not stimulate the  diaphragm.  The right atrial and right ventricular leads were in  satisfactory position.  They were secured to the subpectoralis fascia  with a figure-of-eight silk suture.  The sewing  sleeve was also secured  with silk suture.  Electrocautery was utilized to make a subcutaneous  pocket.  Kanamycin irrigation was utilized to irrigate the pocket, and  electrocautery was utilized to ensure hemostasis.  The Medtronic EnTrust  model [id number] dual-chamber ICD, serial number [id number] was connected  to the atrial and defibrillation lead and then placed back in the  subcutaneous pocket.  The generator was secured with silk suture.  Additional Kanamycin was utilized to irrigate the pocket, and  defibrillation threshold testing carried out.   After the patient was more deeply sedated on fentanyl and Versed, VF was  induced with a T-wave shock and a 15 joule shock  was delivered which  terminated VF and restored sinus rhythm.  At this point, no additional  defibrillation threshold testing was carried out.  The incision was  closed with a layer of 2-0 Vicryl followed by  a layer of 3-0 Vicryl.  Benzoin was painted on the skin.  Steri-strips were applied.  Pressure  dressing was placed.  The patient was returned to his room in  satisfactory condition.   III. COMPLICATIONS:  There were no immediate procedure complications.   IV. RESULTS:  This demonstrates successful implantation of Medtronic  dual-chamber ICD in a patient with symptomatic bradycardia in a  nonischemic/ischemic cardiomyopathy, EF of 20 to 25% in Class 2 heart  failure.      Doylene Canning. Ladona Ridgel, MD  Electronically Signed     GWT/MEDQ  D:  12/23/2007  T:  12/23/2007  Job:  [id number]   cc:   Fara Chute  Fax: [phone]   Learta Codding, MD,FACC  [address]  [place], [place] [address]

## 2011-04-02 NOTE — Assessment & Plan Note (Signed)
[facility]                          [facility]ICE NOTE   [name], [name]                      MRN:          [id number]  DATE:12/09/2007                            DOB:          07-Nov-1934    HISTORY OF PRESENT ILLNESS:  The patient is a 77 year old male with a  history of ventricular bigeminy and nonsustained ventricular  tachycardia.  The patient is status post echocardiogram and Cardiolite  study which demonstrated an ejection fraction of approximately 20%, but  no definite perfusion defects.  This was confirmed by echocardiogram.  The patient is an [facility] class II.  He stated that over the last year or  so, he has been getting some increased fatigue and particularly going up  hill he gives out.  He denies, however, any chest pain, palpitations  or syncope.  The patient was brought back to the office now to discuss  potential need for defibrillator due to increased risk of sudden cardiac  death.  The patient had only average exercise tolerance and was able to  walk 4 minutes 43 seconds on a standard Bruce protocol achieving stage  II with a maximum work load of 7 minutes.   MEDICATIONS:  1. Aspirin 81 mg a day.  2. Lisinopril 10 mg a day which was added after his last office visit      and after review of the echocardiographic study.   PHYSICAL EXAMINATION:  VITAL SIGNS:  Blood pressure is 120/78, heart  rate 57, weight 169 pounds.  NECK:  Normal carotid upstroke and no carotid bruits.  LUNGS:  Clear breath sounds bilaterally.  HEART:  Regular rate and rhythm.  Normal S1-S2.  No murmurs, rubs or  gallops.  ABDOMEN:  Soft, nontender.  No rebound or guarding.  Good bowel sounds.  EXTREMITIES:  No cyanosis, clubbing or edema.   PROBLEM LIST:  1. Asymptomatic ventricular bigeminy and nonsustained ventricular      tachycardia.  2. Bradycardia (no evidence of chronotropic insufficiency).  3. Left ventricular dysfunction likely  secondary to nonischemic      cardiomyopathy.      a.     Ejection fraction 20%.      b.     NYH class II with exertional dyspnea.   PLAN:  1. The patient appears to be at increased risk for sudden cardiac      death given his LV dysfunction.  I suspect this is a nonischemic      cardiomyopathy based on the Cardiolite study.  2. I discussed the patient with Dr. Ladona Ridgel given the fact the patient      has NYH class II with a nonischemic cardiomyopathy, he may still      benefit from a defibrillator implant.  In preparation for this, the      patient will have a left and right heart catheterization done in      our [facility] lab.  After this been performed, Dr. Ladona Ridgel can then review      this and discuss further indications of ICD with the patient.  3. The patient is concerned  that he may not be able to put a shotgun      to his chest anymore, but I told him to further discuss this with      Dr. Ladona Ridgel and this should not really be a problem      provided ICD is plced on the contralateral side.  4. The patient will follow up with [place] after a consultation with Dr.      Ladona Ridgel.     Learta Codding, MD,FACC  Electronically Signed    GED/MedQ  DD: 12/09/2007  DT: 12/09/2007  Job #: [id number]   cc:   Fara Chute

## 2011-04-07 ENCOUNTER — Encounter: Payer: Self-pay | Admitting: *Deleted

## 2011-05-13 ENCOUNTER — Encounter: Payer: Self-pay | Admitting: Internal Medicine

## 2011-06-27 ENCOUNTER — Encounter: Payer: Self-pay | Admitting: Internal Medicine

## 2011-06-27 ENCOUNTER — Other Ambulatory Visit: Payer: Self-pay | Admitting: Internal Medicine

## 2011-06-27 ENCOUNTER — Ambulatory Visit (INDEPENDENT_AMBULATORY_CARE_PROVIDER_SITE_OTHER): Payer: Medicare Other | Admitting: *Deleted

## 2011-06-27 DIAGNOSIS — I5022 Chronic systolic (congestive) heart failure: Secondary | ICD-10-CM

## 2011-06-27 DIAGNOSIS — I428 Other cardiomyopathies: Secondary | ICD-10-CM

## 2011-06-27 LAB — REMOTE ICD DEVICE
AL AMPLITUDE: 1.7091 mv
AL IMPEDENCE ICD: 560 Ohm
ATRIAL PACING ICD: 81.14 pct
CHARGE TIME: 9.889 s
RV LEAD AMPLITUDE: 3.7734 mv
RV LEAD IMPEDENCE ICD: 440 Ohm
TOT-0001: 1
TOT-0002: 0
TZAT-0001SLOWVT: 1
TZAT-0001SLOWVT: 2
TZAT-0002ATACH: NEGATIVE
TZAT-0002ATACH: NEGATIVE
TZAT-0002ATACH: NEGATIVE
TZAT-0002FASTVT: NEGATIVE
TZAT-0011SLOWVT: 10 ms
TZAT-0011SLOWVT: 10 ms
TZAT-0012ATACH: 150 ms
TZAT-0012FASTVT: 200 ms
TZAT-0018ATACH: NEGATIVE
TZAT-0018SLOWVT: NEGATIVE
TZAT-0018SLOWVT: NEGATIVE
TZAT-0019ATACH: 6 V
TZAT-0019ATACH: 6 V
TZAT-0019FASTVT: 8 V
TZAT-0019SLOWVT: 8 V
TZAT-0019SLOWVT: 8 V
TZAT-0020ATACH: 1.5 ms
TZAT-0020ATACH: 1.5 ms
TZAT-0020FASTVT: 1.5 ms
TZAT-0020SLOWVT: 1.5 ms
TZAT-0020SLOWVT: 1.5 ms
TZON-0003VSLOWVT: 370 ms
TZON-0005SLOWVT: 12
TZST-0001ATACH: 5
TZST-0001FASTVT: 5
TZST-0001SLOWVT: 3
TZST-0001SLOWVT: 4
TZST-0001SLOWVT: 5
TZST-0002ATACH: NEGATIVE
TZST-0002ATACH: NEGATIVE
TZST-0002FASTVT: NEGATIVE
TZST-0002FASTVT: NEGATIVE
TZST-0002FASTVT: NEGATIVE
TZST-0003SLOWVT: 15 J
TZST-0003SLOWVT: 35 J

## 2011-07-01 NOTE — Progress Notes (Signed)
ICD remote 

## 2011-07-05 ENCOUNTER — Encounter: Payer: Self-pay | Admitting: *Deleted

## 2011-08-09 LAB — POCT I-STAT 3, VENOUS BLOOD GAS (G3P V)
Bicarbonate: 24.4 — ABNORMAL HIGH
Bicarbonate: 24.5 — ABNORMAL HIGH
Bicarbonate: 25.2 — ABNORMAL HIGH
Operator id: 141321
Operator id: 194801
TCO2: 26
pCO2, Ven: 40.9 — ABNORMAL LOW
pCO2, Ven: 42.5 — ABNORMAL LOW
pCO2, Ven: 44.4 — ABNORMAL LOW
pH, Ven: 7.362 — ABNORMAL HIGH
pH, Ven: 7.369 — ABNORMAL HIGH
pH, Ven: 7.384 — ABNORMAL HIGH
pO2, Ven: 35
pO2, Ven: 39
pO2, Ven: 39

## 2011-08-09 LAB — POCT I-STAT 3, ART BLOOD GAS (G3+)
Operator id: 194801
pH, Arterial: 7.392

## 2011-09-26 ENCOUNTER — Ambulatory Visit (INDEPENDENT_AMBULATORY_CARE_PROVIDER_SITE_OTHER): Payer: Medicare Other | Admitting: *Deleted

## 2011-09-26 ENCOUNTER — Other Ambulatory Visit: Payer: Self-pay | Admitting: Internal Medicine

## 2011-09-26 ENCOUNTER — Encounter: Payer: Self-pay | Admitting: Internal Medicine

## 2011-09-26 DIAGNOSIS — I428 Other cardiomyopathies: Secondary | ICD-10-CM

## 2011-09-26 DIAGNOSIS — Z95810 Presence of automatic (implantable) cardiac defibrillator: Secondary | ICD-10-CM

## 2011-09-29 LAB — REMOTE ICD DEVICE
BAMS-0001: 170 {beats}/min
DEV-0020ICD: NEGATIVE
FVT: 0
PACEART VT: 0
RV LEAD AMPLITUDE: 3.8 mv
TOT-0001: 1
TZAT-0001ATACH: 1
TZAT-0002ATACH: NEGATIVE
TZAT-0002ATACH: NEGATIVE
TZAT-0011SLOWVT: 10 ms
TZAT-0011SLOWVT: 10 ms
TZAT-0012ATACH: 150 ms
TZAT-0012SLOWVT: 200 ms
TZAT-0012SLOWVT: 200 ms
TZAT-0018ATACH: NEGATIVE
TZAT-0018SLOWVT: NEGATIVE
TZAT-0018SLOWVT: NEGATIVE
TZAT-0019ATACH: 6 V
TZAT-0019ATACH: 6 V
TZAT-0019ATACH: 6 V
TZAT-0019SLOWVT: 8 V
TZAT-0019SLOWVT: 8 V
TZAT-0020ATACH: 1.5 ms
TZAT-0020ATACH: 1.5 ms
TZAT-0020FASTVT: 1.5 ms
TZON-0003ATACH: 350 ms
TZON-0003SLOWVT: 340 ms
TZON-0005SLOWVT: 12
TZST-0001ATACH: 4
TZST-0001ATACH: 5
TZST-0001FASTVT: 2
TZST-0001FASTVT: 3
TZST-0001SLOWVT: 4
TZST-0001SLOWVT: 6
TZST-0002ATACH: NEGATIVE
TZST-0002FASTVT: NEGATIVE
TZST-0003SLOWVT: 15 J
TZST-0003SLOWVT: 35 J

## 2011-10-04 NOTE — Progress Notes (Signed)
Remote icd check  

## 2011-10-11 ENCOUNTER — Encounter: Payer: Self-pay | Admitting: *Deleted

## 2011-11-29 DIAGNOSIS — H40019 Open angle with borderline findings, low risk, unspecified eye: Secondary | ICD-10-CM | POA: Diagnosis not present

## 2011-11-29 DIAGNOSIS — H2510 Age-related nuclear cataract, unspecified eye: Secondary | ICD-10-CM | POA: Diagnosis not present

## 2011-12-03 DIAGNOSIS — H2510 Age-related nuclear cataract, unspecified eye: Secondary | ICD-10-CM | POA: Diagnosis not present

## 2011-12-11 ENCOUNTER — Telehealth: Payer: Self-pay | Admitting: Internal Medicine

## 2011-12-11 NOTE — Telephone Encounter (Signed)
All Cardiac faxed to [facility] @  [phone]  12/11/11/KM

## 2011-12-16 DIAGNOSIS — IMO0002 Reserved for concepts with insufficient information to code with codable children: Secondary | ICD-10-CM | POA: Diagnosis not present

## 2011-12-16 DIAGNOSIS — H2510 Age-related nuclear cataract, unspecified eye: Secondary | ICD-10-CM | POA: Diagnosis not present

## 2011-12-16 DIAGNOSIS — H269 Unspecified cataract: Secondary | ICD-10-CM | POA: Diagnosis not present

## 2011-12-17 DIAGNOSIS — R059 Cough, unspecified: Secondary | ICD-10-CM | POA: Diagnosis not present

## 2011-12-17 DIAGNOSIS — R05 Cough: Secondary | ICD-10-CM | POA: Diagnosis not present

## 2011-12-17 DIAGNOSIS — R079 Chest pain, unspecified: Secondary | ICD-10-CM | POA: Diagnosis not present

## 2011-12-26 DIAGNOSIS — H2510 Age-related nuclear cataract, unspecified eye: Secondary | ICD-10-CM | POA: Diagnosis not present

## 2012-01-01 ENCOUNTER — Encounter: Payer: Self-pay | Admitting: Internal Medicine

## 2012-01-01 ENCOUNTER — Ambulatory Visit (INDEPENDENT_AMBULATORY_CARE_PROVIDER_SITE_OTHER): Payer: Medicare Other | Admitting: Internal Medicine

## 2012-01-01 DIAGNOSIS — Z95810 Presence of automatic (implantable) cardiac defibrillator: Secondary | ICD-10-CM

## 2012-01-01 DIAGNOSIS — I428 Other cardiomyopathies: Secondary | ICD-10-CM | POA: Diagnosis not present

## 2012-01-01 LAB — ICD DEVICE OBSERVATION
AL AMPLITUDE: 1.7 mv
AL IMPEDENCE ICD: 560 Ohm
ATRIAL PACING ICD: 76.8 pct
BAMS-0001: 170 {beats}/min
DEV-0020ICD: NEGATIVE
FVT: 0
RV LEAD IMPEDENCE ICD: 488 Ohm
RV LEAD THRESHOLD: 1 V
TOT-0001: 1
TOT-0006: 20090204000000
TZAT-0001FASTVT: 1
TZAT-0002ATACH: NEGATIVE
TZAT-0011SLOWVT: 10 ms
TZAT-0011SLOWVT: 10 ms
TZAT-0012ATACH: 150 ms
TZAT-0012ATACH: 150 ms
TZAT-0012FASTVT: 200 ms
TZAT-0012SLOWVT: 200 ms
TZAT-0012SLOWVT: 200 ms
TZAT-0013SLOWVT: 2
TZAT-0013SLOWVT: 2
TZAT-0018SLOWVT: NEGATIVE
TZAT-0019ATACH: 6 V
TZAT-0019SLOWVT: 8 V
TZAT-0019SLOWVT: 8 V
TZAT-0020ATACH: 1.5 ms
TZAT-0020ATACH: 1.5 ms
TZAT-0020FASTVT: 1.5 ms
TZAT-0020SLOWVT: 1.5 ms
TZAT-0020SLOWVT: 1.5 ms
TZON-0003ATACH: 350 ms
TZON-0003SLOWVT: 340 ms
TZON-0004SLOWVT: 16
TZON-0005SLOWVT: 12
TZST-0001ATACH: 4
TZST-0001ATACH: 5
TZST-0001FASTVT: 2
TZST-0001FASTVT: 3
TZST-0001FASTVT: 4
TZST-0001SLOWVT: 4
TZST-0002ATACH: NEGATIVE
TZST-0002ATACH: NEGATIVE
TZST-0002FASTVT: NEGATIVE
TZST-0002FASTVT: NEGATIVE
TZST-0003SLOWVT: 15 J
TZST-0003SLOWVT: 35 J
TZST-0003SLOWVT: 35 J
VENTRICULAR PACING ICD: 1.29 pct
VF: 0

## 2012-01-01 NOTE — Patient Instructions (Signed)
Your physician wants you to follow-up in: 12 months with Dr Court Joy will receive a reminder letter in the mail two months in advance. If you don't receive a letter, please call our office to schedule the follow-up appointment.   Remote monitoring is used to monitor your Pacemaker of ICD from home. This monitoring reduces the number of office visits required to check your device to one time per year. It allows [place] to keep an eye on the functioning of your device to ensure it is working properly. You are scheduled for a device check from home on 04/02/12. You may send your transmission at any time that day. If you have a wireless device, the transmission will be sent automatically. After your physician reviews your transmission, you will receive a postcard with your next transmission date.

## 2012-01-01 NOTE — Progress Notes (Signed)
HPI Mr. Colledge returns today for followup. He is a very pleasant 78 year old man with a nonischemic cardiomyopathy and chronic class II congestive heart failure. The patient has done well since his device was placed. He denies chest pain. He denies peripheral edema. He's had no recent ICD shocks. No syncope. He has rare dizzy spells. No Known Allergies   Current Outpatient Prescriptions  Medication Sig Dispense Refill  . aspirin (ASPIR-81) 81 MG EC tablet Take 81 mg by mouth daily.        Marland Kitchen levothyroxine (SYNTHROID, LEVOTHROID) 50 MCG tablet Take 50 mcg by mouth daily.        Marland Kitchen lisinopril (PRINIVIL,ZESTRIL) 5 MG tablet Take 5 mg by mouth daily.        . metoprolol succinate (TOPROL-XL) 25 MG 24 hr tablet Take 25 mg by mouth 2 (two) times daily.        . Tamsulosin HCl (FLOMAX) 0.4 MG CAPS Take 0.4 mg by mouth daily.         Past Medical History  Diagnosis Date  . DCM (dilated cardiomyopathy)   . CAD (coronary artery disease)     minimal by cath  . CHF NYHA class II   . Automatic implantable cardiac defibrillator in situ     ROS:   All systems reviewed and negative except as noted in the HPI.   Past Surgical History  Procedure Date  . S/p icd 2009  . Cardiac catheterization 12/17/07     Family History  Problem Relation Age of Onset  . Tuberculosis Father      History   Social History  . Marital Status: Married    Spouse Name: N/A    Number of Children: N/A  . Years of Education: N/A   Occupational History  . Not on file.   Social History Main Topics  . Smoking status: Never Smoker   . Smokeless tobacco: Not on file  . Alcohol Use: No  . Drug Use:   . Sexually Active:    Other Topics Concern  . Not on file   Social History Narrative   Retired.      BP 114/64  Pulse 78  Ht 6\' 3"  (1.905 m)  Wt 81.103 kg (178 lb 12.8 oz)  BMI 22.35 kg/m2  Physical Exam:  Well appearing 78 year old man, NAD HEENT: Unremarkable Neck:  No JVD, no  thyromegally Lungs:  Clear with no wheezes, rales, or rhonchi. HEART:  Regular rate rhythm, no murmurs, no rubs, no clicks Abd:  soft, positive bowel sounds, no organomegally, no rebound, no guarding Ext:  2 plus pulses, no edema, no cyanosis, no clubbing Skin:  No rashes no nodules Neuro:  CN II through XII intact, motor grossly intact  DEVICE  Normal device function.  See PaceArt for details.   Assess/Plan:

## 2012-01-01 NOTE — Assessment & Plan Note (Signed)
His device is working normally. We'll plan to recheck in several months. 

## 2012-01-06 DIAGNOSIS — IMO0002 Reserved for concepts with insufficient information to code with codable children: Secondary | ICD-10-CM | POA: Diagnosis not present

## 2012-01-06 DIAGNOSIS — H269 Unspecified cataract: Secondary | ICD-10-CM | POA: Diagnosis not present

## 2012-01-06 DIAGNOSIS — H2510 Age-related nuclear cataract, unspecified eye: Secondary | ICD-10-CM | POA: Diagnosis not present

## 2012-01-13 DIAGNOSIS — E039 Hypothyroidism, unspecified: Secondary | ICD-10-CM | POA: Diagnosis not present

## 2012-01-13 DIAGNOSIS — I509 Heart failure, unspecified: Secondary | ICD-10-CM | POA: Diagnosis not present

## 2012-01-13 DIAGNOSIS — I10 Essential (primary) hypertension: Secondary | ICD-10-CM | POA: Diagnosis not present

## 2012-01-13 DIAGNOSIS — N400 Enlarged prostate without lower urinary tract symptoms: Secondary | ICD-10-CM | POA: Diagnosis not present

## 2012-01-13 DIAGNOSIS — M1990 Unspecified osteoarthritis, unspecified site: Secondary | ICD-10-CM | POA: Diagnosis not present

## 2012-04-02 ENCOUNTER — Ambulatory Visit (INDEPENDENT_AMBULATORY_CARE_PROVIDER_SITE_OTHER): Payer: Medicare Other | Admitting: *Deleted

## 2012-04-02 ENCOUNTER — Encounter: Payer: Self-pay | Admitting: Internal Medicine

## 2012-04-02 DIAGNOSIS — Z95810 Presence of automatic (implantable) cardiac defibrillator: Secondary | ICD-10-CM | POA: Diagnosis not present

## 2012-04-02 DIAGNOSIS — I5022 Chronic systolic (congestive) heart failure: Secondary | ICD-10-CM

## 2012-04-06 LAB — REMOTE ICD DEVICE
AL IMPEDENCE ICD: 536 Ohm
ATRIAL PACING ICD: 72.52 pct
CHARGE TIME: 10.039 s
TOT-0002: 0
TOT-0006: 20090204000000
TZAT-0001ATACH: 1
TZAT-0001ATACH: 2
TZAT-0001ATACH: 3
TZAT-0001FASTVT: 1
TZAT-0001SLOWVT: 1
TZAT-0001SLOWVT: 2
TZAT-0002ATACH: NEGATIVE
TZAT-0002FASTVT: NEGATIVE
TZAT-0012ATACH: 150 ms
TZAT-0013SLOWVT: 2
TZAT-0013SLOWVT: 2
TZAT-0018ATACH: NEGATIVE
TZAT-0018ATACH: NEGATIVE
TZAT-0018ATACH: NEGATIVE
TZAT-0018FASTVT: NEGATIVE
TZAT-0018SLOWVT: NEGATIVE
TZAT-0018SLOWVT: NEGATIVE
TZAT-0019SLOWVT: 8 V
TZAT-0020ATACH: 1.5 ms
TZAT-0020SLOWVT: 1.5 ms
TZAT-0020SLOWVT: 1.5 ms
TZON-0003VSLOWVT: 370 ms
TZON-0004SLOWVT: 16
TZON-0004VSLOWVT: 20
TZON-0005SLOWVT: 12
TZST-0001ATACH: 4
TZST-0001ATACH: 5
TZST-0001ATACH: 6
TZST-0001FASTVT: 3
TZST-0001FASTVT: 4
TZST-0001FASTVT: 6
TZST-0001SLOWVT: 3
TZST-0001SLOWVT: 5
TZST-0001SLOWVT: 6
TZST-0002ATACH: NEGATIVE
TZST-0002ATACH: NEGATIVE
TZST-0002ATACH: NEGATIVE
TZST-0002FASTVT: NEGATIVE
TZST-0002FASTVT: NEGATIVE
TZST-0003SLOWVT: 35 J
TZST-0003SLOWVT: 35 J
VENTRICULAR PACING ICD: 1.35 pct
VF: 0

## 2012-04-20 ENCOUNTER — Encounter: Payer: Self-pay | Admitting: *Deleted

## 2012-07-08 DIAGNOSIS — I509 Heart failure, unspecified: Secondary | ICD-10-CM | POA: Diagnosis not present

## 2012-07-08 DIAGNOSIS — M1990 Unspecified osteoarthritis, unspecified site: Secondary | ICD-10-CM | POA: Diagnosis not present

## 2012-07-08 DIAGNOSIS — E039 Hypothyroidism, unspecified: Secondary | ICD-10-CM | POA: Diagnosis not present

## 2012-07-08 DIAGNOSIS — I10 Essential (primary) hypertension: Secondary | ICD-10-CM | POA: Diagnosis not present

## 2012-07-08 DIAGNOSIS — N400 Enlarged prostate without lower urinary tract symptoms: Secondary | ICD-10-CM | POA: Diagnosis not present

## 2012-07-09 ENCOUNTER — Encounter: Payer: Self-pay | Admitting: Internal Medicine

## 2012-07-09 ENCOUNTER — Ambulatory Visit (INDEPENDENT_AMBULATORY_CARE_PROVIDER_SITE_OTHER): Payer: Medicare Other | Admitting: *Deleted

## 2012-07-09 DIAGNOSIS — I428 Other cardiomyopathies: Secondary | ICD-10-CM

## 2012-07-09 DIAGNOSIS — I5022 Chronic systolic (congestive) heart failure: Secondary | ICD-10-CM

## 2012-07-09 DIAGNOSIS — Z95810 Presence of automatic (implantable) cardiac defibrillator: Secondary | ICD-10-CM

## 2012-07-10 LAB — REMOTE ICD DEVICE
AL AMPLITUDE: 1.6 mv
AL IMPEDENCE ICD: 504 Ohm
ATRIAL PACING ICD: 63.18 pct
BAMS-0001: 170 {beats}/min
BRDY-0004RA: 120 {beats}/min
CHARGE TIME: 10.37 s
RV LEAD IMPEDENCE ICD: 400 Ohm
TOT-0001: 1
TOT-0002: 0
TZAT-0001ATACH: 1
TZAT-0001ATACH: 2
TZAT-0001ATACH: 3
TZAT-0002ATACH: NEGATIVE
TZAT-0004SLOWVT: 8
TZAT-0005SLOWVT: 88 pct
TZAT-0005SLOWVT: 91 pct
TZAT-0011SLOWVT: 10 ms
TZAT-0011SLOWVT: 10 ms
TZAT-0012ATACH: 150 ms
TZAT-0012ATACH: 150 ms
TZAT-0012FASTVT: 200 ms
TZAT-0012SLOWVT: 200 ms
TZAT-0012SLOWVT: 200 ms
TZAT-0018ATACH: NEGATIVE
TZAT-0018ATACH: NEGATIVE
TZAT-0018ATACH: NEGATIVE
TZAT-0019FASTVT: 8 V
TZAT-0019SLOWVT: 8 V
TZAT-0020ATACH: 1.5 ms
TZAT-0020FASTVT: 1.5 ms
TZON-0003SLOWVT: 340 ms
TZON-0003VSLOWVT: 370 ms
TZON-0004VSLOWVT: 20
TZST-0001ATACH: 4
TZST-0001ATACH: 5
TZST-0001ATACH: 6
TZST-0001FASTVT: 2
TZST-0001FASTVT: 3
TZST-0001FASTVT: 5
TZST-0001SLOWVT: 4
TZST-0001SLOWVT: 5
TZST-0001SLOWVT: 6
TZST-0002ATACH: NEGATIVE
TZST-0002ATACH: NEGATIVE
TZST-0002ATACH: NEGATIVE
TZST-0002FASTVT: NEGATIVE
TZST-0002FASTVT: NEGATIVE
TZST-0002FASTVT: NEGATIVE
TZST-0003SLOWVT: 15 J
TZST-0003SLOWVT: 35 J

## 2012-07-13 ENCOUNTER — Encounter: Payer: Self-pay | Admitting: *Deleted

## 2012-07-14 DIAGNOSIS — I509 Heart failure, unspecified: Secondary | ICD-10-CM | POA: Diagnosis not present

## 2012-07-14 DIAGNOSIS — I10 Essential (primary) hypertension: Secondary | ICD-10-CM | POA: Diagnosis not present

## 2012-07-14 DIAGNOSIS — E039 Hypothyroidism, unspecified: Secondary | ICD-10-CM | POA: Diagnosis not present

## 2012-07-14 DIAGNOSIS — N400 Enlarged prostate without lower urinary tract symptoms: Secondary | ICD-10-CM | POA: Diagnosis not present

## 2012-08-19 DIAGNOSIS — H40019 Open angle with borderline findings, low risk, unspecified eye: Secondary | ICD-10-CM | POA: Diagnosis not present

## 2012-10-12 ENCOUNTER — Encounter: Payer: Self-pay | Admitting: Internal Medicine

## 2012-10-12 ENCOUNTER — Ambulatory Visit (INDEPENDENT_AMBULATORY_CARE_PROVIDER_SITE_OTHER): Payer: Medicare Other | Admitting: *Deleted

## 2012-10-12 DIAGNOSIS — Z95810 Presence of automatic (implantable) cardiac defibrillator: Secondary | ICD-10-CM

## 2012-10-12 DIAGNOSIS — I428 Other cardiomyopathies: Secondary | ICD-10-CM

## 2012-10-20 LAB — REMOTE ICD DEVICE
AL AMPLITUDE: 2 mv
AL IMPEDENCE ICD: 504 Ohm
ATRIAL PACING ICD: 72.16 pct
BAMS-0001: 170 {beats}/min
BRDY-0004RA: 120 {beats}/min
CHARGE TIME: 10.37 s
RV LEAD IMPEDENCE ICD: 384 Ohm
TOT-0001: 1
TOT-0002: 0
TOT-0006: 20090204000000
TZAT-0002ATACH: NEGATIVE
TZAT-0002ATACH: NEGATIVE
TZAT-0004SLOWVT: 8
TZAT-0005SLOWVT: 88 pct
TZAT-0005SLOWVT: 91 pct
TZAT-0011SLOWVT: 10 ms
TZAT-0011SLOWVT: 10 ms
TZAT-0012ATACH: 150 ms
TZAT-0012FASTVT: 200 ms
TZAT-0012SLOWVT: 200 ms
TZAT-0012SLOWVT: 200 ms
TZAT-0019ATACH: 6 V
TZAT-0019ATACH: 6 V
TZAT-0019ATACH: 6 V
TZAT-0019FASTVT: 8 V
TZAT-0020ATACH: 1.5 ms
TZAT-0020FASTVT: 1.5 ms
TZON-0003ATACH: 350 ms
TZON-0003SLOWVT: 340 ms
TZST-0001FASTVT: 2
TZST-0001FASTVT: 3
TZST-0001FASTVT: 5
TZST-0001SLOWVT: 4
TZST-0001SLOWVT: 6
TZST-0002FASTVT: NEGATIVE
TZST-0002FASTVT: NEGATIVE
TZST-0002FASTVT: NEGATIVE
TZST-0003SLOWVT: 15 J
TZST-0003SLOWVT: 35 J

## 2012-11-20 ENCOUNTER — Encounter: Payer: Self-pay | Admitting: *Deleted

## 2012-12-21 ENCOUNTER — Telehealth: Payer: Self-pay | Admitting: Internal Medicine

## 2012-12-21 NOTE — Telephone Encounter (Signed)
New problem:   C/o sob at night & during walking.

## 2012-12-21 NOTE — Telephone Encounter (Addendum)
Called and spoke with patient.  He has been having SOB for a good while walking across the yard.  He wakes up at night and can't catch his breath.  He feels like his weight up a little and he slept in the chair on Fri and Sun night.  Will add him on Thurs at 4:30

## 2012-12-24 ENCOUNTER — Ambulatory Visit (INDEPENDENT_AMBULATORY_CARE_PROVIDER_SITE_OTHER): Payer: Medicare Other | Admitting: Internal Medicine

## 2012-12-24 ENCOUNTER — Encounter: Payer: Self-pay | Admitting: Internal Medicine

## 2012-12-24 VITALS — BP 114/60 | HR 59 | Wt 180.0 lb

## 2012-12-24 DIAGNOSIS — I428 Other cardiomyopathies: Secondary | ICD-10-CM | POA: Diagnosis not present

## 2012-12-24 DIAGNOSIS — I4949 Other premature depolarization: Secondary | ICD-10-CM

## 2012-12-24 DIAGNOSIS — Z95810 Presence of automatic (implantable) cardiac defibrillator: Secondary | ICD-10-CM | POA: Diagnosis not present

## 2012-12-24 DIAGNOSIS — I5022 Chronic systolic (congestive) heart failure: Secondary | ICD-10-CM

## 2012-12-24 DIAGNOSIS — I493 Ventricular premature depolarization: Secondary | ICD-10-CM

## 2012-12-24 LAB — ICD DEVICE OBSERVATION
ATRIAL PACING ICD: 67.78 pct
BAMS-0001: 170 {beats}/min
DEV-0020ICD: NEGATIVE
FVT: 0
RV LEAD THRESHOLD: 1 V
TZAT-0002ATACH: NEGATIVE
TZAT-0002ATACH: NEGATIVE
TZAT-0004SLOWVT: 8
TZAT-0005SLOWVT: 88 pct
TZAT-0005SLOWVT: 91 pct
TZAT-0011SLOWVT: 10 ms
TZAT-0011SLOWVT: 10 ms
TZAT-0012ATACH: 150 ms
TZAT-0012ATACH: 150 ms
TZAT-0012FASTVT: 200 ms
TZAT-0012SLOWVT: 200 ms
TZAT-0012SLOWVT: 200 ms
TZAT-0019ATACH: 6 V
TZAT-0019ATACH: 6 V
TZAT-0019SLOWVT: 8 V
TZAT-0020ATACH: 1.5 ms
TZAT-0020ATACH: 1.5 ms
TZAT-0020ATACH: 1.5 ms
TZAT-0020FASTVT: 1.5 ms
TZON-0003ATACH: 350 ms
TZON-0003SLOWVT: 340 ms
TZST-0001ATACH: 4
TZST-0001FASTVT: 2
TZST-0001FASTVT: 3
TZST-0001SLOWVT: 4
TZST-0002ATACH: NEGATIVE
TZST-0002FASTVT: NEGATIVE
TZST-0002FASTVT: NEGATIVE
TZST-0002FASTVT: NEGATIVE
TZST-0003SLOWVT: 15 J
TZST-0003SLOWVT: 35 J
TZST-0003SLOWVT: 35 J
VENTRICULAR PACING ICD: 2.51 pct
VF: 0

## 2012-12-24 MED ORDER — LISINOPRIL 5 MG PO TABS: 5.0000 mg | ORAL_TABLET | Freq: Every day | ORAL | Status: DC

## 2012-12-24 MED ORDER — LISINOPRIL 10 MG PO TABS: 10.0000 mg | ORAL_TABLET | Freq: Every day | ORAL | Status: DC

## 2012-12-24 MED ORDER — FUROSEMIDE 40 MG PO TABS: 40.0000 mg | ORAL_TABLET | Freq: Every day | ORAL | Status: DC

## 2012-12-24 MED ORDER — POTASSIUM CHLORIDE CRYS ER 20 MEQ PO TBCR: 20.0000 meq | EXTENDED_RELEASE_TABLET | Freq: Every day | ORAL | Status: DC

## 2012-12-24 NOTE — Progress Notes (Signed)
HPI [name] returns today for followup. He is a very pleasant 79 year old and with a nonischemic cardiomyopathy and chronic well compensated class II congestive heart failure. He is status post prophylactic ICD implantation. For the past couple of months, the patient has noted increasing dyspnea with exertion. In the past couple of weeks, he has had PND and orthopnea. Over the past couple of days, he has noted peripheral edema. He does admit to dietary indiscretion. He does not have palpitations. On ICD interrogation, he was noted to have an increase in his frequency of ventricular ectopy. He has had no syncope and denies any ICD shock. No Known Allergies   Current Outpatient Prescriptions  Medication Sig Dispense Refill  . aspirin (ASPIR-81) 81 MG EC tablet Take 81 mg by mouth daily.        Marland Kitchen levothyroxine (SYNTHROID, LEVOTHROID) 50 MCG tablet Take 50 mcg by mouth daily.        Marland Kitchen lisinopril (PRINIVIL,ZESTRIL) 5 MG tablet Take 1 tablet (5 mg total) by mouth daily.  90 tablet  3  . metoprolol succinate (TOPROL-XL) 25 MG 24 hr tablet Take 25 mg by mouth 2 (two) times daily.        . Tamsulosin HCl (FLOMAX) 0.4 MG CAPS Take 0.4 mg by mouth daily.      . furosemide (LASIX) 40 MG tablet Take 1 tablet (40 mg total) by mouth daily.  90 tablet  3  . potassium chloride SA (K-DUR,KLOR-CON) 20 MEQ tablet Take 1 tablet (20 mEq total) by mouth daily.  90 tablet  3     Past Medical History  Diagnosis Date  . DCM (dilated cardiomyopathy)   . CAD (coronary artery disease)     minimal by cath  . CHF NYHA class II   . Automatic implantable cardiac defibrillator in situ     ROS:   All systems reviewed and negative except as noted in the HPI.   Past Surgical History  Procedure Date  . S/p icd 2009  . Cardiac catheterization 12/17/07     Family History  Problem Relation Age of Onset  . Tuberculosis Father      History   Social History  . Marital Status: Married    Spouse Name: N/A   Number of Children: N/A  . Years of Education: N/A   Occupational History  . Not on file.   Social History Main Topics  . Smoking status: Never Smoker   . Smokeless tobacco: Not on file  . Alcohol Use: No  . Drug Use:   . Sexually Active:    Other Topics Concern  . Not on file   Social History Narrative   Retired.      BP 114/60  Pulse 59  Wt 180 lb (81.647 kg)  SpO2 98%  Physical Exam:  Well appearing 79 year old man, NAD HEENT: Unremarkable Neck:  8 cm JVD, no thyromegally Lungs:  Clear except for rales in the bases. HEART:  IRegular rate rhythm, no murmurs, no rubs, no clicks Abd:  soft, positive bowel sounds, no organomegally, no rebound, no guarding Ext:  2 plus pulses, no edema, no cyanosis, no clubbing Skin:  No rashes no nodules Neuro:  CN II through XII intact, motor grossly intact  EKG normal sinus rhythm with frequent PVCs, which appear to be originating from the right ventricular outflow tract.  DEVICE  Normal device function.  See PaceArt for details.   Assess/Plan:

## 2012-12-24 NOTE — Assessment & Plan Note (Signed)
He has had a marked increase in the density of premature ventricular beats. He is also had some nonsustained ventricular tachycardia. I've recommended treatment of his heart failure for now, but may consider antiarrhythmic drug therapy if his premature beats continue. Catheter ablation would also be another consideration, particularly if antiarrhythmic drug therapy is inaffective.

## 2012-12-24 NOTE — Assessment & Plan Note (Signed)
His congestive heart failure symptoms have worsened from class IIA to class IIIB. It is unclear whether his ventricular ectopy is causative or a result of his worsening heart failure. I've asked the patient to start diuretic therapy with Lasix 40 mg daily, supplemental potassium 20 mEq daily and up titration of his ACE inhibitor to lisinopril 10 mg daily. I may consider switching him from metoprolol to carvedilol in the future. I've strongly encouraged a low-sodium diet.

## 2012-12-24 NOTE — Patient Instructions (Addendum)
Your physician recommends that you schedule a follow-up appointment with Dr Ladona Ridgel as scheduled   Your physician has recommended you make the following change in your medication:  1) Start Lasix 40mg  daily 2) Start Potassium daily 3) Increase Lisinopril to 5mg  daily    Your physician recommends that you schedule a follow-up appointment next week for a BMP and an EKG

## 2012-12-24 NOTE — Assessment & Plan Note (Signed)
His ICD is working normally. We'll plan to recheck in several months. 

## 2012-12-31 ENCOUNTER — Other Ambulatory Visit: Payer: Medicare Other

## 2013-01-04 DIAGNOSIS — I4891 Unspecified atrial fibrillation: Secondary | ICD-10-CM | POA: Diagnosis not present

## 2013-01-04 DIAGNOSIS — N400 Enlarged prostate without lower urinary tract symptoms: Secondary | ICD-10-CM | POA: Diagnosis not present

## 2013-01-04 DIAGNOSIS — I10 Essential (primary) hypertension: Secondary | ICD-10-CM | POA: Diagnosis not present

## 2013-01-04 DIAGNOSIS — I509 Heart failure, unspecified: Secondary | ICD-10-CM | POA: Diagnosis not present

## 2013-01-04 DIAGNOSIS — E039 Hypothyroidism, unspecified: Secondary | ICD-10-CM | POA: Diagnosis not present

## 2013-01-07 ENCOUNTER — Encounter: Payer: Self-pay | Admitting: Internal Medicine

## 2013-01-07 ENCOUNTER — Other Ambulatory Visit (INDEPENDENT_AMBULATORY_CARE_PROVIDER_SITE_OTHER): Payer: Medicare Other

## 2013-01-07 ENCOUNTER — Ambulatory Visit (INDEPENDENT_AMBULATORY_CARE_PROVIDER_SITE_OTHER): Payer: Medicare Other | Admitting: Internal Medicine

## 2013-01-07 VITALS — BP 108/68 | HR 90 | Wt 168.0 lb

## 2013-01-07 DIAGNOSIS — I493 Ventricular premature depolarization: Secondary | ICD-10-CM

## 2013-01-07 DIAGNOSIS — I4949 Other premature depolarization: Secondary | ICD-10-CM

## 2013-01-07 DIAGNOSIS — I5022 Chronic systolic (congestive) heart failure: Secondary | ICD-10-CM

## 2013-01-07 DIAGNOSIS — I428 Other cardiomyopathies: Secondary | ICD-10-CM | POA: Diagnosis not present

## 2013-01-07 LAB — BASIC METABOLIC PANEL
BUN: 37 mg/dL — ABNORMAL HIGH (ref 6–23)
CO2: 32 mEq/L (ref 19–32)
GFR: 54.55 mL/min — ABNORMAL LOW (ref >60.00)
Glucose, Bld: 88 mg/dL (ref 70–99)
Potassium: 5.5 mEq/L — ABNORMAL HIGH (ref 3.5–5.1)
Sodium: 137 mEq/L (ref 135–145)

## 2013-01-07 MED ORDER — AMIODARONE HCL 200 MG PO TABS: 200.0000 mg | ORAL_TABLET | Freq: Every day | ORAL | Status: DC

## 2013-01-07 NOTE — Assessment & Plan Note (Signed)
His heart failure symptoms are improved today. His PND and orthopnea have resolved on diuretic therapy, and up titration of his ACE inhibitor.

## 2013-01-07 NOTE — Progress Notes (Addendum)
HPI [name] returns today for followup. He is a 79 year old man with a nonischemic cardiomyopathy, chronic systolic heart failure, and nonsustained ventricular tachycardia. He is status post ICD implantation. When I saw the patient last a couple of weeks ago, his heart failure symptoms had worsened, and his electrocardiogram demonstrated increased ventricular ectopy. He was begun on diuretic therapy, and his ACE inhibitor was uptitrated. Since then his heart failure symptoms have improved. He does not experience palpitations. He notes when he stands up quickly, he will become dizzy. Also, when he walks fast, particularly up a hill, he will get dizzy and lightheaded. No anginal symptoms. No peripheral edema No Known Allergies   Current Outpatient Prescriptions  Medication Sig Dispense Refill  . aspirin (ASPIR-81) 81 MG EC tablet Take 81 mg by mouth daily.        . furosemide (LASIX) 40 MG tablet Take 1 tablet (40 mg total) by mouth daily.  90 tablet  3  . levothyroxine (SYNTHROID, LEVOTHROID) 50 MCG tablet Take 50 mcg by mouth daily.        Marland Kitchen lisinopril (PRINIVIL,ZESTRIL) 5 MG tablet Take 1 tablet (5 mg total) by mouth daily.  90 tablet  3  . metoprolol succinate (TOPROL-XL) 25 MG 24 hr tablet Take 25 mg by mouth 2 (two) times daily.        . potassium chloride SA (K-DUR,KLOR-CON) 20 MEQ tablet Take 1 tablet (20 mEq total) by mouth daily.  90 tablet  3  . Tamsulosin HCl (FLOMAX) 0.4 MG CAPS Take 0.4 mg by mouth daily.      Marland Kitchen amiodarone (PACERONE) 200 MG tablet Take 1 tablet (200 mg total) by mouth daily.  30 tablet  1   No current facility-administered medications for this visit.     Past Medical History  Diagnosis Date  . DCM (dilated cardiomyopathy)   . CAD (coronary artery disease)     minimal by cath  . CHF NYHA class II   . Automatic implantable cardiac defibrillator in situ     ROS:   All systems reviewed and negative except as noted in the HPI.   Past Surgical History   Procedure Laterality Date  . S/p icd  2009  . Cardiac catheterization  12/17/07     Family History  Problem Relation Age of Onset  . Tuberculosis Father      History   Social History  . Marital Status: Married    Spouse Name: N/A    Number of Children: N/A  . Years of Education: N/A   Occupational History  . Not on file.   Social History Main Topics  . Smoking status: Never Smoker   . Smokeless tobacco: Not on file  . Alcohol Use: No  . Drug Use:   . Sexually Active:    Other Topics Concern  . Not on file   Social History Narrative   Retired.      BP 108/68  Pulse 90  Wt 168 lb (76.204 kg)  BMI 21 kg/m2  Physical Exam:  Well appearing 79 year old man, NAD HEENT: Unremarkable Neck:  7 cm JVD, no thyromegally Lungs:  Clear with no wheezes, rales, or rhonchi. HEART:  IRegular rate rhythm, no murmurs, no rubs, no clicks Abd:  soft, positive bowel sounds, no organomegally, no rebound, no guarding Ext:  2 plus pulses, no edema, no cyanosis, no clubbing Skin:  No rashes no nodules Neuro:  CN II through XII intact, motor grossly intact  EKG - normal sinus  rhythm with frequent and consecutive ventricular ectopic beats  DEVICE  - not evaluated today.  Assess/Plan:

## 2013-01-07 NOTE — Assessment & Plan Note (Signed)
His ventricular ectopy appears to be worse. I've asked the patient to start low-dose amiodarone therapy. I'll see him back in several weeks. He is instructed to call us if he has any ICD shocks or syncope.

## 2013-01-12 ENCOUNTER — Encounter: Payer: Medicare Other | Admitting: Internal Medicine

## 2013-01-25 ENCOUNTER — Encounter: Payer: Self-pay | Admitting: Internal Medicine

## 2013-01-25 ENCOUNTER — Ambulatory Visit (INDEPENDENT_AMBULATORY_CARE_PROVIDER_SITE_OTHER): Payer: Medicare Other | Admitting: Internal Medicine

## 2013-01-25 VITALS — BP 112/74 | HR 69 | Ht 74.0 in | Wt 171.0 lb

## 2013-01-25 DIAGNOSIS — I493 Ventricular premature depolarization: Secondary | ICD-10-CM

## 2013-01-25 DIAGNOSIS — I4949 Other premature depolarization: Secondary | ICD-10-CM

## 2013-01-25 DIAGNOSIS — Z95810 Presence of automatic (implantable) cardiac defibrillator: Secondary | ICD-10-CM | POA: Diagnosis not present

## 2013-01-25 DIAGNOSIS — I5022 Chronic systolic (congestive) heart failure: Secondary | ICD-10-CM | POA: Diagnosis not present

## 2013-01-25 LAB — ICD DEVICE OBSERVATION
BAMS-0001: 170 {beats}/min
DEV-0020ICD: NEGATIVE
TZAT-0001ATACH: 2
TZAT-0001ATACH: 3
TZAT-0001SLOWVT: 2
TZAT-0002ATACH: NEGATIVE
TZAT-0004SLOWVT: 8
TZAT-0004SLOWVT: 8
TZAT-0005SLOWVT: 88 pct
TZAT-0005SLOWVT: 91 pct
TZAT-0012ATACH: 150 ms
TZAT-0012ATACH: 150 ms
TZAT-0012FASTVT: 200 ms
TZAT-0012SLOWVT: 200 ms
TZAT-0012SLOWVT: 200 ms
TZAT-0018ATACH: NEGATIVE
TZAT-0018FASTVT: NEGATIVE
TZAT-0019FASTVT: 8 V
TZAT-0020ATACH: 1.5 ms
TZAT-0020ATACH: 1.5 ms
TZAT-0020FASTVT: 1.5 ms
TZAT-0020SLOWVT: 1.5 ms
TZAT-0020SLOWVT: 1.5 ms
TZON-0003ATACH: 350 ms
TZON-0003SLOWVT: 340 ms
TZON-0003VSLOWVT: 370 ms
TZON-0004SLOWVT: 32
TZON-0004VSLOWVT: 36
TZST-0001ATACH: 4
TZST-0001ATACH: 6
TZST-0001FASTVT: 2
TZST-0001FASTVT: 4
TZST-0001FASTVT: 6
TZST-0001SLOWVT: 3
TZST-0001SLOWVT: 5
TZST-0002FASTVT: NEGATIVE
TZST-0002FASTVT: NEGATIVE
TZST-0002FASTVT: NEGATIVE
TZST-0003SLOWVT: 15 J

## 2013-01-25 LAB — BASIC METABOLIC PANEL
BUN: 19 mg/dL (ref 6–23)
Chloride: 101 mEq/L (ref 96–112)
Potassium: 4.4 mEq/L (ref 3.5–5.1)

## 2013-01-25 NOTE — Patient Instructions (Addendum)
Remote monitoring is used to monitor your Pacemaker of ICD from home. This monitoring reduces the number of office visits required to check your device to one time per year. It allows [place] to keep an eye on the functioning of your device to ensure it is working properly. You are scheduled for a device check from home on Apr 02, 2013. You may send your transmission at any time that day. If you have a wireless device, the transmission will be sent automatically. After your physician reviews your transmission, you will receive a postcard with your next transmission date.  Your physician recommends that you schedule a follow-up appointment in: 2 MONTHS WITH DR Ladona Ridgel  Your physician recommends that you HAVE LAB WORK TODAY

## 2013-01-25 NOTE — Assessment & Plan Note (Signed)
His Medtronic dual-chamber ICD is working normally. We'll plan a recheck in several months.

## 2013-01-25 NOTE — Assessment & Plan Note (Addendum)
His heart failure symptoms are improved. He will continue his current medical therapy, and maintain a low-sodium diet. Today he will go for blood work to follow his potassium and creatinine.

## 2013-01-25 NOTE — Assessment & Plan Note (Signed)
He continues to have fairly dense ventricular ectopy, although I think it is improved. The patient will continue his amiodarone for now, though we would consider catheter ablation in the future.

## 2013-01-25 NOTE — Progress Notes (Signed)
HPI [name] returns today for followup. He is a very pleasant 79 year old man with a nonischemic cardiomyopathy and chronic systolic heart failure. Several months ago, he developed worsening heart failure symptoms. We have increased his medical therapy including diuretic therapy, and started him on amiodarone as the patient had developed dense ventricular ectopy. Overall he is improved. His heart failure has gone from class IIIB to class IIA. He does not have palpitations. No ICD shocks. No syncope or peripheral edema. No Known Allergies   Current Outpatient Prescriptions  Medication Sig Dispense Refill  . amiodarone (PACERONE) 200 MG tablet Take 1 tablet (200 mg total) by mouth daily.  30 tablet  1  . aspirin (ASPIR-81) 81 MG EC tablet Take 81 mg by mouth daily.        . furosemide (LASIX) 40 MG tablet Take 1 tablet (40 mg total) by mouth daily.  90 tablet  3  . levothyroxine (SYNTHROID, LEVOTHROID) 50 MCG tablet Take 50 mcg by mouth daily.        Marland Kitchen lisinopril (PRINIVIL,ZESTRIL) 5 MG tablet Take 1 tablet (5 mg total) by mouth daily.  90 tablet  3  . metoprolol tartrate (LOPRESSOR) 25 MG tablet Take 25 mg by mouth 2 (two) times daily.      . Tamsulosin HCl (FLOMAX) 0.4 MG CAPS Take 0.4 mg by mouth daily.       No current facility-administered medications for this visit.     Past Medical History  Diagnosis Date  . DCM (dilated cardiomyopathy)   . CAD (coronary artery disease)     minimal by cath  . CHF NYHA class II   . Automatic implantable cardiac defibrillator in situ     ROS:   All systems reviewed and negative except as noted in the HPI.   Past Surgical History  Procedure Laterality Date  . S/p icd  2009  . Cardiac catheterization  12/17/07     Family History  Problem Relation Age of Onset  . Tuberculosis Father      History   Social History  . Marital Status: Married    Spouse Name: N/A    Number of Children: N/A  . Years of Education: N/A   Occupational  History  . Not on file.   Social History Main Topics  . Smoking status: Never Smoker   . Smokeless tobacco: Not on file  . Alcohol Use: No  . Drug Use:   . Sexually Active:    Other Topics Concern  . Not on file   Social History Narrative   Retired.      BP 112/74  Pulse 69  Ht 6\' 2"  (1.88 m)  Wt 171 lb (77.565 kg)  BMI 21.95 kg/m2  SpO2 95%  Physical Exam:  Well appearing 79 year old man,NAD HEENT: Unremarkable Neck:  No JVD, no thyromegally Lungs:  Clear with no wheezes, rales, or rhonchi. HEART:  IRegular rate rhythm, no murmurs, no rubs, no clicks Abd:  soft, positive bowel sounds, no organomegally, no rebound, no guarding Ext:  2 plus pulses, no edema, no cyanosis, no clubbing Skin:  No rashes no nodules Neuro:  CN II through XII intact, motor grossly intact  EKG - normal sinus rhythm with PVCs in a bigeminal fashion.  DEVICE  Normal device function.  See PaceArt for details.   Assess/Plan:

## 2013-02-03 ENCOUNTER — Other Ambulatory Visit: Payer: Self-pay | Admitting: *Deleted

## 2013-02-03 MED ORDER — AMIODARONE HCL 200 MG PO TABS: 200.0000 mg | ORAL_TABLET | Freq: Every day | ORAL | Status: DC

## 2013-02-09 NOTE — Progress Notes (Signed)
This encounter was created in error - please disregard.

## 2013-02-18 ENCOUNTER — Encounter: Payer: Self-pay | Admitting: Internal Medicine

## 2013-02-18 ENCOUNTER — Ambulatory Visit (INDEPENDENT_AMBULATORY_CARE_PROVIDER_SITE_OTHER): Payer: Medicare Other | Admitting: Internal Medicine

## 2013-02-18 VITALS — BP 125/78 | HR 64 | Ht 75.0 in | Wt 171.8 lb

## 2013-02-18 DIAGNOSIS — I4949 Other premature depolarization: Secondary | ICD-10-CM | POA: Diagnosis not present

## 2013-02-18 DIAGNOSIS — I5022 Chronic systolic (congestive) heart failure: Secondary | ICD-10-CM | POA: Diagnosis not present

## 2013-02-18 DIAGNOSIS — I428 Other cardiomyopathies: Secondary | ICD-10-CM | POA: Diagnosis not present

## 2013-02-18 DIAGNOSIS — Z95810 Presence of automatic (implantable) cardiac defibrillator: Secondary | ICD-10-CM

## 2013-02-18 DIAGNOSIS — I493 Ventricular premature depolarization: Secondary | ICD-10-CM

## 2013-02-18 LAB — ICD DEVICE OBSERVATION
AL IMPEDENCE ICD: 600 Ohm
ATRIAL PACING ICD: 48.58 pct
BAMS-0001: 170 {beats}/min
DEV-0020ICD: NEGATIVE
FVT: 0
RV LEAD THRESHOLD: 1 V
TZAT-0001ATACH: 1
TZAT-0002ATACH: NEGATIVE
TZAT-0002ATACH: NEGATIVE
TZAT-0005SLOWVT: 88 pct
TZAT-0005SLOWVT: 91 pct
TZAT-0011SLOWVT: 10 ms
TZAT-0011SLOWVT: 10 ms
TZAT-0012ATACH: 150 ms
TZAT-0012SLOWVT: 200 ms
TZAT-0012SLOWVT: 200 ms
TZAT-0018SLOWVT: NEGATIVE
TZAT-0019ATACH: 6 V
TZAT-0019ATACH: 6 V
TZAT-0019SLOWVT: 8 V
TZAT-0019SLOWVT: 8 V
TZAT-0020ATACH: 1.5 ms
TZAT-0020ATACH: 1.5 ms
TZAT-0020FASTVT: 1.5 ms
TZON-0003ATACH: 350 ms
TZON-0003SLOWVT: 340 ms
TZON-0005SLOWVT: 12
TZST-0001ATACH: 4
TZST-0001ATACH: 5
TZST-0001FASTVT: 2
TZST-0001FASTVT: 3
TZST-0001SLOWVT: 4
TZST-0001SLOWVT: 6
TZST-0002ATACH: NEGATIVE
TZST-0002FASTVT: NEGATIVE
TZST-0003SLOWVT: 15 J
TZST-0003SLOWVT: 35 J
TZST-0003SLOWVT: 35 J
VENTRICULAR PACING ICD: 6.88 pct
VF: 0

## 2013-02-18 NOTE — Assessment & Plan Note (Signed)
His ICD is working normally. Interrogation today demonstrates a reduction in PVCs and nonsustained VT. He will continue amiodarone.

## 2013-02-18 NOTE — Assessment & Plan Note (Signed)
His heart failure symptoms are class II in stable. No change in medical therapy today. We will continue a low-sodium diet.

## 2013-02-18 NOTE — Assessment & Plan Note (Signed)
These have improved. He will continue his current medical therapy. If they worsen, we would consider catheter ablation.

## 2013-02-18 NOTE — Patient Instructions (Addendum)
Your physician recommends that you schedule a follow-up appointment in: 3 months with Dr Taylor  

## 2013-02-18 NOTE — Progress Notes (Signed)
HPI [name] returns today for followup. He is a very pleasant 79 year old man with a nonischemic cardiomyopathy, and recent development of worsening heart failure, related to increasingly frequent episodes of PVCs and nonsustained ventricular tachycardia. He had his medications up titrated and was begun on amiodarone therapy. He is improved. He has occasional orthostatic symptoms. His shortness of breath is gone back to class II, from class IIIB. He has no palpitations and has had no syncope or ICD shocks. No Known Allergies   Current Outpatient Prescriptions  Medication Sig Dispense Refill  . amiodarone (PACERONE) 200 MG tablet Take 1 tablet (200 mg total) by mouth daily.  30 tablet  3  . aspirin (ASPIR-81) 81 MG EC tablet Take 81 mg by mouth daily.        . furosemide (LASIX) 40 MG tablet Take 1 tablet (40 mg total) by mouth daily.  90 tablet  3  . levothyroxine (SYNTHROID, LEVOTHROID) 50 MCG tablet Take 50 mcg by mouth daily.        Marland Kitchen lisinopril (PRINIVIL,ZESTRIL) 5 MG tablet Take 1 tablet (5 mg total) by mouth daily.  90 tablet  3  . metoprolol tartrate (LOPRESSOR) 25 MG tablet Take 25 mg by mouth 2 (two) times daily.      . Tamsulosin HCl (FLOMAX) 0.4 MG CAPS Take 0.4 mg by mouth daily.       No current facility-administered medications for this visit.     Past Medical History  Diagnosis Date  . DCM (dilated cardiomyopathy)   . CAD (coronary artery disease)     minimal by cath  . CHF NYHA class II   . Automatic implantable cardiac defibrillator in situ     ROS:   All systems reviewed and negative except as noted in the HPI.   Past Surgical History  Procedure Laterality Date  . S/p icd  2009  . Cardiac catheterization  12/17/07     Family History  Problem Relation Age of Onset  . Tuberculosis Father      History   Social History  . Marital Status: Married    Spouse Name: N/A    Number of Children: N/A  . Years of Education: N/A   Occupational History  .  Not on file.   Social History Main Topics  . Smoking status: Never Smoker   . Smokeless tobacco: Not on file  . Alcohol Use: No  . Drug Use:   . Sexually Active:    Other Topics Concern  . Not on file   Social History Narrative   Retired.      BP 125/78  Pulse 64  Ht 6\' 3"  (1.905 m)  Wt 171 lb 12.8 oz (77.928 kg)  BMI 21.47 kg/m2  Physical Exam:  Well appearing 79 year old man,NAD HEENT: Unremarkable Neck:  7 cm JVD, no thyromegally Lungs:  Clear with no wheezes, rales, or rhonchi. HEART:  Regular rate rhythm, no murmurs, no rubs, no clicks Abd:  soft, positive bowel sounds, no organomegally, no rebound, no guarding Ext:  2 plus pulses, no edema, no cyanosis, no clubbing Skin:  No rashes no nodules Neuro:  CN II through XII intact, motor grossly intact  DEVICE  Normal device function.  See PaceArt for details.   Assess/Plan:

## 2013-04-09 ENCOUNTER — Encounter: Payer: Medicare Other | Admitting: Internal Medicine

## 2013-04-13 ENCOUNTER — Telehealth: Payer: Self-pay | Admitting: Internal Medicine

## 2013-04-13 DIAGNOSIS — I5022 Chronic systolic (congestive) heart failure: Secondary | ICD-10-CM

## 2013-04-13 DIAGNOSIS — I493 Ventricular premature depolarization: Secondary | ICD-10-CM

## 2013-04-13 MED ORDER — FUROSEMIDE 40 MG PO TABS
20.0000 mg | ORAL_TABLET | Freq: Every day | ORAL | Status: DC
Start: 2013-04-13 — End: 2013-06-01

## 2013-04-13 MED ORDER — METOPROLOL TARTRATE 25 MG PO TABS: ORAL_TABLET | ORAL | Status: DC

## 2013-04-13 NOTE — Telephone Encounter (Signed)
New problem    C/O dizzy   Blood pressure 70/44 . Pulse 61.  Again  73 /42 pulse 57.

## 2013-04-13 NOTE — Telephone Encounter (Signed)
Discussed with Dr Ladona Ridgel will have patient decrease his Lisinopril to 5mg  daily (has been taking 10mg ), Metoprolol to 12.5mg  bid, and Furosemide 20mg  daily.  He will call me back after he checks his BP's later this week

## 2013-04-22 ENCOUNTER — Telehealth: Payer: Self-pay | Admitting: Internal Medicine

## 2013-04-22 DIAGNOSIS — I509 Heart failure, unspecified: Secondary | ICD-10-CM | POA: Diagnosis not present

## 2013-04-22 DIAGNOSIS — I4891 Unspecified atrial fibrillation: Secondary | ICD-10-CM | POA: Diagnosis not present

## 2013-04-22 DIAGNOSIS — N400 Enlarged prostate without lower urinary tract symptoms: Secondary | ICD-10-CM | POA: Diagnosis not present

## 2013-04-22 DIAGNOSIS — I10 Essential (primary) hypertension: Secondary | ICD-10-CM | POA: Diagnosis not present

## 2013-04-22 DIAGNOSIS — E039 Hypothyroidism, unspecified: Secondary | ICD-10-CM | POA: Diagnosis not present

## 2013-04-22 NOTE — Telephone Encounter (Signed)
New problem    Was told to call to call back with blood pressure reading.     132/80  Pulse 56. this am @ 7:40    Last sat  88/57 pulse  66. @ 2:45 pm  @ 3 pm 83/54 pulse  64 . @ 3:35 pm  125/62 pulse 67. @ 5:40 pm 121/ 67 pulse 64 . Taken blood pressure in same arm   6/1 @ 6 am 137/76 pulse 75. @ 6:30 am  143/75 pulse  74 .@ 6:55 am 130/70 pulse 54. @ 4:10 pm 107/48 pulse 57.   6/2 @ 7:30 am 122/72 pulse 56 .@ 10:12 am 98/48 pulse 73 . @ 7:40 pm 118/65 pulse 74   6/3 @ am 136/72 pulse 70 . @ 7:15 am 128/67 pulse 62 . @ 9:15 pm 134/67 pulse 71 . @ 9:30 pm 118/67 pulse 59 .   6/4 @ 7:10 am 123/66 pulse 67.

## 2013-04-23 NOTE — Telephone Encounter (Signed)
Dr Ladona Ridgel aware and feels his reading are much better.  Will not make any changes in medications

## 2013-04-29 DIAGNOSIS — I951 Orthostatic hypotension: Secondary | ICD-10-CM | POA: Diagnosis not present

## 2013-04-29 DIAGNOSIS — E039 Hypothyroidism, unspecified: Secondary | ICD-10-CM | POA: Diagnosis not present

## 2013-04-29 DIAGNOSIS — N400 Enlarged prostate without lower urinary tract symptoms: Secondary | ICD-10-CM | POA: Diagnosis not present

## 2013-04-29 DIAGNOSIS — N189 Chronic kidney disease, unspecified: Secondary | ICD-10-CM | POA: Diagnosis not present

## 2013-04-29 DIAGNOSIS — I4891 Unspecified atrial fibrillation: Secondary | ICD-10-CM | POA: Diagnosis not present

## 2013-04-29 DIAGNOSIS — I10 Essential (primary) hypertension: Secondary | ICD-10-CM | POA: Diagnosis not present

## 2013-04-29 DIAGNOSIS — I509 Heart failure, unspecified: Secondary | ICD-10-CM | POA: Diagnosis not present

## 2013-05-20 DIAGNOSIS — H40019 Open angle with borderline findings, low risk, unspecified eye: Secondary | ICD-10-CM | POA: Diagnosis not present

## 2013-06-01 ENCOUNTER — Encounter: Payer: Self-pay | Admitting: Internal Medicine

## 2013-06-01 ENCOUNTER — Ambulatory Visit (INDEPENDENT_AMBULATORY_CARE_PROVIDER_SITE_OTHER): Payer: Medicare Other | Admitting: Internal Medicine

## 2013-06-01 VITALS — BP 101/61 | HR 62 | Ht 75.0 in | Wt 170.6 lb

## 2013-06-01 DIAGNOSIS — I428 Other cardiomyopathies: Secondary | ICD-10-CM

## 2013-06-01 DIAGNOSIS — I5022 Chronic systolic (congestive) heart failure: Secondary | ICD-10-CM

## 2013-06-01 DIAGNOSIS — Z95810 Presence of automatic (implantable) cardiac defibrillator: Secondary | ICD-10-CM | POA: Diagnosis not present

## 2013-06-01 LAB — ICD DEVICE OBSERVATION
AL IMPEDENCE ICD: 528 Ohm
AL THRESHOLD: 0.5 V
BATTERY VOLTAGE: 2.72 V
CHARGE TIME: 11.02 s
RV LEAD AMPLITUDE: 4.1165 mv
TOT-0001: 1
TOT-0002: 0
TOT-0006: 20090204000000
TZAT-0001FASTVT: 1
TZAT-0001SLOWVT: 1
TZAT-0001SLOWVT: 2
TZAT-0002ATACH: NEGATIVE
TZAT-0002FASTVT: NEGATIVE
TZAT-0005SLOWVT: 88 pct
TZAT-0005SLOWVT: 91 pct
TZAT-0012ATACH: 150 ms
TZAT-0012ATACH: 150 ms
TZAT-0013SLOWVT: 2
TZAT-0013SLOWVT: 2
TZAT-0018ATACH: NEGATIVE
TZAT-0018FASTVT: NEGATIVE
TZAT-0018SLOWVT: NEGATIVE
TZAT-0018SLOWVT: NEGATIVE
TZAT-0019ATACH: 6 V
TZAT-0019FASTVT: 8 V
TZAT-0019SLOWVT: 8 V
TZAT-0020ATACH: 1.5 ms
TZAT-0020ATACH: 1.5 ms
TZAT-0020ATACH: 1.5 ms
TZAT-0020SLOWVT: 1.5 ms
TZON-0003ATACH: 350 ms
TZON-0003VSLOWVT: 370 ms
TZON-0004SLOWVT: 32
TZON-0005SLOWVT: 12
TZST-0001ATACH: 4
TZST-0001FASTVT: 3
TZST-0001FASTVT: 4
TZST-0001FASTVT: 5
TZST-0001SLOWVT: 4
TZST-0001SLOWVT: 5
TZST-0002ATACH: NEGATIVE
TZST-0002FASTVT: NEGATIVE
TZST-0002FASTVT: NEGATIVE
TZST-0002FASTVT: NEGATIVE
TZST-0003SLOWVT: 15 J
TZST-0003SLOWVT: 35 J

## 2013-06-01 MED ORDER — LOSARTAN POTASSIUM 25 MG PO TABS: 25.0000 mg | ORAL_TABLET | Freq: Every day | ORAL | Status: DC

## 2013-06-01 MED ORDER — AMIODARONE HCL 200 MG PO TABS: ORAL_TABLET | ORAL | Status: DC

## 2013-06-01 NOTE — Assessment & Plan Note (Signed)
His ICD is working normally. His PVC's have resolved on amiodarone. Will reduce the dose.

## 2013-06-01 NOTE — Patient Instructions (Addendum)
Your physician wants you to follow-up in: 12 months with Dr Court Joy will receive a reminder letter in the mail two months in advance. If you don't receive a letter, please call our office to schedule the follow-up appointment.  Remote monitoring is used to monitor your Pacemaker orICD from home. This monitoring reduces the number of office visits required to check your device to one time per year. It allows [place] to keep an eye on the functioning of your device to ensure it is working properly. You are scheduled for a device check from home on 10/20/14You may send your transmission at any time that day. If you have a wireless device, the transmission will be sent automatically. After your physician reviews your transmission, you will receive a postcard with your next transmission date.  Your physician has recommended you make the following change in your medication:  1) Decrease Amiodarone to 200mg  --1 tablet Mon-Fri and 1/2 tablet 100mg  Sat and Sun 2) Stop Lisinopril  2) Start Cozaar 25 mg daily

## 2013-06-01 NOTE — Progress Notes (Signed)
HPI [name] returns today for followup. He is a pleasant 79 yo man with a h/o a non-ischemic CM, chronic systolic heart failure, PVC's which are symptomatic, s/p ICD implant. In the interim he is much improved. His CHF symptoms are class 2a, having improved from his symptoms of 3B CHF. He denies palpitations. No other complaints. He has had some dizziness.   No Known Allergies   Current Outpatient Prescriptions  Medication Sig Dispense Refill  . amiodarone (PACERONE) 200 MG tablet Take 1 tablet Mon- Fri and 1/2 tablet Sat and Sun  30 tablet  3  . aspirin (ASPIR-81) 81 MG EC tablet Take 81 mg by mouth daily.        . furosemide (LASIX) 40 MG tablet Take 20 mg by mouth as needed. Dr. Neita Carp (PCP) told him to take this only as needed      . levothyroxine (SYNTHROID, LEVOTHROID) 50 MCG tablet Take 50 mcg by mouth daily.        . metoprolol tartrate (LOPRESSOR) 25 MG tablet Take 1/2 tablet twice daily      . Tamsulosin HCl (FLOMAX) 0.4 MG CAPS Take 0.4 mg by mouth daily.      Marland Kitchen losartan (COZAAR) 25 MG tablet Take 1 tablet (25 mg total) by mouth daily.  90 tablet  3   No current facility-administered medications for this visit.     Past Medical History  Diagnosis Date  . DCM (dilated cardiomyopathy)   . CAD (coronary artery disease)     minimal by cath  . CHF NYHA class II   . Automatic implantable cardiac defibrillator in situ     ROS:   All systems reviewed and negative except as noted in the HPI.   Past Surgical History  Procedure Laterality Date  . S/p icd  2009  . Cardiac catheterization  12/17/07     Family History  Problem Relation Age of Onset  . Tuberculosis Father      History   Social History  . Marital Status: Married    Spouse Name: N/A    Number of Children: N/A  . Years of Education: N/A   Occupational History  . Not on file.   Social History Main Topics  . Smoking status: Never Smoker   . Smokeless tobacco: Not on file  . Alcohol Use: No  .  Drug Use:   . Sexually Active:    Other Topics Concern  . Not on file   Social History Narrative   Retired.      BP 101/61  Pulse 62  Ht 6\' 3"  (1.905 m)  Wt 170 lb 9.6 oz (77.384 kg)  BMI 21.32 kg/m2  Physical Exam:  Well appearing NAD HEENT: Unremarkable Neck:  No JVD, no thyromegally Lungs:  Clear with no wheezes, rales, or rhonchi HEART:  Regular rate rhythm, no murmurs, no rubs, no clicks Abd:  soft, positive bowel sounds, no organomegally, no rebound, no guarding Ext:  2 plus pulses, no edema, no cyanosis, no clubbing Skin:  No rashes no nodules Neuro:  CN II through XII intact, motor grossly intact  DEVICE  Normal device function.  See PaceArt for details.   Assess/Plan:

## 2013-06-01 NOTE — Assessment & Plan Note (Signed)
His symptoms have improved markedly. He will continue his current meds and maintain a low sodium diet.

## 2013-09-06 ENCOUNTER — Encounter: Payer: Self-pay | Admitting: Internal Medicine

## 2013-09-06 ENCOUNTER — Ambulatory Visit (INDEPENDENT_AMBULATORY_CARE_PROVIDER_SITE_OTHER): Payer: Medicare Other | Admitting: *Deleted

## 2013-09-06 DIAGNOSIS — I428 Other cardiomyopathies: Secondary | ICD-10-CM | POA: Diagnosis not present

## 2013-09-06 DIAGNOSIS — Z95810 Presence of automatic (implantable) cardiac defibrillator: Secondary | ICD-10-CM

## 2013-09-15 LAB — REMOTE ICD DEVICE
AL AMPLITUDE: 1.3 mv
AL IMPEDENCE ICD: 544 Ohm
ATRIAL PACING ICD: 99.8 pct
BAMS-0001: 170 {beats}/min
BATTERY VOLTAGE: 2.65 V
CHARGE TIME: 12.041 s
DEV-0020ICD: NEGATIVE
FVT: 0
PACEART VT: 0
RV LEAD AMPLITUDE: 4.5 mv
RV LEAD IMPEDENCE ICD: 400 Ohm
TOT-0001: 1
TOT-0002: 0
TOT-0006: 20090204000000
TZAT-0001ATACH: 1
TZAT-0001ATACH: 2
TZAT-0001ATACH: 3
TZAT-0001FASTVT: 1
TZAT-0001SLOWVT: 1
TZAT-0001SLOWVT: 2
TZAT-0002ATACH: NEGATIVE
TZAT-0002ATACH: NEGATIVE
TZAT-0002ATACH: NEGATIVE
TZAT-0002FASTVT: NEGATIVE
TZAT-0004SLOWVT: 8
TZAT-0004SLOWVT: 8
TZAT-0005SLOWVT: 88 pct
TZAT-0005SLOWVT: 91 pct
TZAT-0011SLOWVT: 10 ms
TZAT-0011SLOWVT: 10 ms
TZAT-0012ATACH: 150 ms
TZAT-0012ATACH: 150 ms
TZAT-0012ATACH: 150 ms
TZAT-0012FASTVT: 200 ms
TZAT-0012SLOWVT: 200 ms
TZAT-0012SLOWVT: 200 ms
TZAT-0013SLOWVT: 2
TZAT-0013SLOWVT: 2
TZAT-0018ATACH: NEGATIVE
TZAT-0018ATACH: NEGATIVE
TZAT-0018ATACH: NEGATIVE
TZAT-0018FASTVT: NEGATIVE
TZAT-0018SLOWVT: NEGATIVE
TZAT-0018SLOWVT: NEGATIVE
TZAT-0019ATACH: 6 V
TZAT-0019ATACH: 6 V
TZAT-0019ATACH: 6 V
TZAT-0019FASTVT: 8 V
TZAT-0019SLOWVT: 8 V
TZAT-0019SLOWVT: 8 V
TZAT-0020ATACH: 1.5 ms
TZAT-0020ATACH: 1.5 ms
TZAT-0020ATACH: 1.5 ms
TZAT-0020FASTVT: 1.5 ms
TZAT-0020SLOWVT: 1.5 ms
TZAT-0020SLOWVT: 1.5 ms
TZON-0003ATACH: 350 ms
TZON-0003SLOWVT: 340 ms
TZON-0003VSLOWVT: 370 ms
TZON-0004SLOWVT: 32
TZON-0004VSLOWVT: 36
TZON-0005SLOWVT: 12
TZST-0001ATACH: 4
TZST-0001ATACH: 5
TZST-0001ATACH: 6
TZST-0001FASTVT: 2
TZST-0001FASTVT: 3
TZST-0001FASTVT: 4
TZST-0001FASTVT: 5
TZST-0001FASTVT: 6
TZST-0001SLOWVT: 3
TZST-0001SLOWVT: 4
TZST-0001SLOWVT: 5
TZST-0001SLOWVT: 6
TZST-0002ATACH: NEGATIVE
TZST-0002ATACH: NEGATIVE
TZST-0002ATACH: NEGATIVE
TZST-0002FASTVT: NEGATIVE
TZST-0002FASTVT: NEGATIVE
TZST-0002FASTVT: NEGATIVE
TZST-0002FASTVT: NEGATIVE
TZST-0002FASTVT: NEGATIVE
TZST-0003SLOWVT: 15 J
TZST-0003SLOWVT: 35 J
TZST-0003SLOWVT: 35 J
TZST-0003SLOWVT: 35 J
VENTRICULAR PACING ICD: 0 pct
VF: 0

## 2013-09-22 ENCOUNTER — Other Ambulatory Visit: Payer: Self-pay

## 2013-09-22 DIAGNOSIS — I428 Other cardiomyopathies: Secondary | ICD-10-CM

## 2013-09-22 MED ORDER — AMIODARONE HCL 200 MG PO TABS: ORAL_TABLET | ORAL | Status: DC

## 2013-09-24 ENCOUNTER — Encounter: Payer: Self-pay | Admitting: *Deleted

## 2013-10-27 ENCOUNTER — Other Ambulatory Visit: Payer: Self-pay

## 2013-10-27 DIAGNOSIS — I428 Other cardiomyopathies: Secondary | ICD-10-CM

## 2013-10-27 MED ORDER — AMIODARONE HCL 200 MG PO TABS: ORAL_TABLET | ORAL | Status: DC

## 2013-10-30 DIAGNOSIS — Z2821 Immunization not carried out because of patient refusal: Secondary | ICD-10-CM | POA: Diagnosis not present

## 2013-10-30 DIAGNOSIS — M5030 Other cervical disc degeneration, unspecified cervical region: Secondary | ICD-10-CM | POA: Diagnosis not present

## 2013-10-30 DIAGNOSIS — R55 Syncope and collapse: Secondary | ICD-10-CM | POA: Diagnosis not present

## 2013-10-30 DIAGNOSIS — R51 Headache: Secondary | ICD-10-CM | POA: Diagnosis not present

## 2013-10-30 DIAGNOSIS — M47812 Spondylosis without myelopathy or radiculopathy, cervical region: Secondary | ICD-10-CM | POA: Diagnosis not present

## 2013-10-30 DIAGNOSIS — Z95810 Presence of automatic (implantable) cardiac defibrillator: Secondary | ICD-10-CM | POA: Diagnosis not present

## 2013-10-30 DIAGNOSIS — F4489 Other dissociative and conversion disorders: Secondary | ICD-10-CM | POA: Diagnosis not present

## 2013-10-30 DIAGNOSIS — J449 Chronic obstructive pulmonary disease, unspecified: Secondary | ICD-10-CM | POA: Diagnosis not present

## 2013-10-30 DIAGNOSIS — I428 Other cardiomyopathies: Secondary | ICD-10-CM | POA: Diagnosis not present

## 2013-10-30 DIAGNOSIS — I6789 Other cerebrovascular disease: Secondary | ICD-10-CM | POA: Diagnosis not present

## 2013-10-31 DIAGNOSIS — R55 Syncope and collapse: Secondary | ICD-10-CM | POA: Diagnosis not present

## 2013-11-08 ENCOUNTER — Telehealth: Payer: Self-pay | Admitting: Internal Medicine

## 2013-11-08 NOTE — Telephone Encounter (Signed)
New Problem:  Pt states he passed out on 10/30/13.[name] He went to the hospital that day... Then he went to his [facility] today. They informed him to call Dr. Lubertha Basque office and make him aware and what has been going. Call pt with any questions.

## 2013-11-08 NOTE — Telephone Encounter (Signed)
Per patient he is feeling fine now. Patient did go to hospital in [place], was there for one night and states he had a device check. Discussed with Jacklynn Ganong. and she left a message to try to get results. Will follow up tomorrow. Advised patient

## 2013-11-09 NOTE — Telephone Encounter (Signed)
Gunnar Fusi confirmed device check done while patient in hospital. Advised patient. Will forward to Pablo Lawrence RN and Dr Ladona Ridgel for review

## 2013-11-17 NOTE — Telephone Encounter (Signed)
Will forward to [facility] for documentation

## 2013-11-30 DIAGNOSIS — I10 Essential (primary) hypertension: Secondary | ICD-10-CM | POA: Diagnosis not present

## 2013-11-30 DIAGNOSIS — R350 Frequency of micturition: Secondary | ICD-10-CM | POA: Diagnosis not present

## 2013-11-30 DIAGNOSIS — I4891 Unspecified atrial fibrillation: Secondary | ICD-10-CM | POA: Diagnosis not present

## 2013-11-30 DIAGNOSIS — N400 Enlarged prostate without lower urinary tract symptoms: Secondary | ICD-10-CM | POA: Diagnosis not present

## 2013-11-30 DIAGNOSIS — I951 Orthostatic hypotension: Secondary | ICD-10-CM | POA: Diagnosis not present

## 2013-11-30 DIAGNOSIS — R55 Syncope and collapse: Secondary | ICD-10-CM | POA: Diagnosis not present

## 2013-12-10 ENCOUNTER — Ambulatory Visit (INDEPENDENT_AMBULATORY_CARE_PROVIDER_SITE_OTHER): Payer: Medicare Other | Admitting: *Deleted

## 2013-12-10 DIAGNOSIS — I428 Other cardiomyopathies: Secondary | ICD-10-CM

## 2013-12-10 LAB — MDC_IDC_ENUM_SESS_TYPE_REMOTE
Battery Voltage: 2.64 V
Brady Statistic AP VS Percent: 97.57 %
Brady Statistic RA Percent Paced: 97.63 %
HighPow Impedance: 38 Ohm
HighPow Impedance: 46 Ohm
Lead Channel Setting Pacing Amplitude: 2 V
Lead Channel Setting Sensing Sensitivity: 0.3 mV
MDC IDC MSMT LEADCHNL RA IMPEDANCE VALUE: 528 Ohm
MDC IDC MSMT LEADCHNL RA SENSING INTR AMPL: 1.3147
MDC IDC MSMT LEADCHNL RV IMPEDANCE VALUE: 368 Ohm
MDC IDC MSMT LEADCHNL RV SENSING INTR AMPL: 3.4304
MDC IDC SESS DTM: 20150123143804
MDC IDC SET LEADCHNL RV PACING AMPLITUDE: 2.5 V
MDC IDC SET LEADCHNL RV PACING PULSEWIDTH: 0.4 ms
MDC IDC SET ZONE DETECTION INTERVAL: 300 ms
MDC IDC SET ZONE DETECTION INTERVAL: 350 ms
MDC IDC STAT BRADY AP VP PERCENT: 0.05 %
MDC IDC STAT BRADY AS VP PERCENT: 0 %
MDC IDC STAT BRADY AS VS PERCENT: 2.37 %
MDC IDC STAT BRADY RV PERCENT PACED: 0.05 %
Zone Setting Detection Interval: 340 ms
Zone Setting Detection Interval: 370 ms

## 2013-12-14 ENCOUNTER — Encounter: Payer: Self-pay | Admitting: *Deleted

## 2013-12-16 ENCOUNTER — Encounter: Payer: Self-pay | Admitting: Internal Medicine

## 2013-12-17 ENCOUNTER — Telehealth: Payer: Self-pay | Admitting: Internal Medicine

## 2013-12-17 NOTE — Telephone Encounter (Signed)
Spoke w/pt in regards to monthly remote checks. Battery voltage 2.64 V and will be checked monthly. Pt aware of the reason for monthly checks.

## 2013-12-17 NOTE — Telephone Encounter (Signed)
New Prob     Pt has some questions regarding his routine remote checks. Please call.

## 2013-12-29 DIAGNOSIS — I509 Heart failure, unspecified: Secondary | ICD-10-CM | POA: Diagnosis not present

## 2013-12-29 DIAGNOSIS — I4891 Unspecified atrial fibrillation: Secondary | ICD-10-CM | POA: Diagnosis not present

## 2013-12-29 DIAGNOSIS — I10 Essential (primary) hypertension: Secondary | ICD-10-CM | POA: Diagnosis not present

## 2013-12-29 DIAGNOSIS — N189 Chronic kidney disease, unspecified: Secondary | ICD-10-CM | POA: Diagnosis not present

## 2013-12-29 DIAGNOSIS — E039 Hypothyroidism, unspecified: Secondary | ICD-10-CM | POA: Diagnosis not present

## 2013-12-29 DIAGNOSIS — N400 Enlarged prostate without lower urinary tract symptoms: Secondary | ICD-10-CM | POA: Diagnosis not present

## 2013-12-29 DIAGNOSIS — I951 Orthostatic hypotension: Secondary | ICD-10-CM | POA: Diagnosis not present

## 2013-12-29 DIAGNOSIS — M1990 Unspecified osteoarthritis, unspecified site: Secondary | ICD-10-CM | POA: Diagnosis not present

## 2014-01-11 ENCOUNTER — Encounter: Payer: Medicare Other | Admitting: *Deleted

## 2014-01-25 ENCOUNTER — Encounter: Payer: Self-pay | Admitting: *Deleted

## 2014-02-10 ENCOUNTER — Ambulatory Visit (INDEPENDENT_AMBULATORY_CARE_PROVIDER_SITE_OTHER): Payer: Medicare Other | Admitting: *Deleted

## 2014-02-10 DIAGNOSIS — I5022 Chronic systolic (congestive) heart failure: Secondary | ICD-10-CM

## 2014-02-10 DIAGNOSIS — I428 Other cardiomyopathies: Secondary | ICD-10-CM

## 2014-02-10 LAB — MDC_IDC_ENUM_SESS_TYPE_REMOTE
Battery Voltage: 2.64 V
Brady Statistic AP VS Percent: 97.3 %
Brady Statistic AS VS Percent: 2.4 %
HIGH POWER IMPEDANCE MEASURED VALUE: 47 Ohm
HighPow Impedance: 39 Ohm
Lead Channel Impedance Value: 384 Ohm
Lead Channel Setting Pacing Amplitude: 2 V
Lead Channel Setting Pacing Amplitude: 2.5 V
Lead Channel Setting Pacing Pulse Width: 0.4 ms
Lead Channel Setting Sensing Sensitivity: 0.3 mV
MDC IDC MSMT LEADCHNL RA IMPEDANCE VALUE: 544 Ohm
MDC IDC MSMT LEADCHNL RA SENSING INTR AMPL: 1.3 mV
MDC IDC MSMT LEADCHNL RV SENSING INTR AMPL: 3.4 mV
MDC IDC SET ZONE DETECTION INTERVAL: 340 ms
MDC IDC SET ZONE DETECTION INTERVAL: 370 ms
MDC IDC STAT BRADY AP VP PERCENT: 0.3 %
MDC IDC STAT BRADY AS VP PERCENT: 0.1 % — AB
Zone Setting Detection Interval: 300 ms
Zone Setting Detection Interval: 350 ms

## 2014-02-16 ENCOUNTER — Encounter: Payer: Self-pay | Admitting: *Deleted

## 2014-02-25 ENCOUNTER — Encounter: Payer: Self-pay | Admitting: Internal Medicine

## 2014-02-28 NOTE — Addendum Note (Signed)
Addended by: Shiela Mayer on: 02/28/2014 02:30 PM   Modules accepted: Level of Service

## 2014-03-14 ENCOUNTER — Other Ambulatory Visit: Payer: Self-pay | Admitting: Internal Medicine

## 2014-03-16 ENCOUNTER — Ambulatory Visit (INDEPENDENT_AMBULATORY_CARE_PROVIDER_SITE_OTHER): Payer: Medicare Other | Admitting: *Deleted

## 2014-03-16 ENCOUNTER — Encounter: Payer: Self-pay | Admitting: Internal Medicine

## 2014-03-16 DIAGNOSIS — I428 Other cardiomyopathies: Secondary | ICD-10-CM | POA: Diagnosis not present

## 2014-03-16 LAB — MDC_IDC_ENUM_SESS_TYPE_REMOTE
Battery Voltage: 2.62 V
Brady Statistic AP VP Percent: 0.03 %
Brady Statistic AP VS Percent: 98.08 %
Brady Statistic AS VP Percent: 0 %
Brady Statistic RA Percent Paced: 98.11 %
Brady Statistic RV Percent Paced: 0.03 %
Date Time Interrogation Session: 20150429125822
HIGH POWER IMPEDANCE MEASURED VALUE: 39 Ohm
HIGH POWER IMPEDANCE MEASURED VALUE: 48 Ohm
Lead Channel Impedance Value: 384 Ohm
Lead Channel Impedance Value: 504 Ohm
Lead Channel Sensing Intrinsic Amplitude: 1.1833
Lead Channel Setting Pacing Amplitude: 2 V
Lead Channel Setting Pacing Amplitude: 2.5 V
Lead Channel Setting Sensing Sensitivity: 0.3 mV
MDC IDC MSMT LEADCHNL RV SENSING INTR AMPL: 4.4595
MDC IDC SET LEADCHNL RV PACING PULSEWIDTH: 0.4 ms
MDC IDC SET ZONE DETECTION INTERVAL: 340 ms
MDC IDC STAT BRADY AS VS PERCENT: 1.89 %
Zone Setting Detection Interval: 300 ms
Zone Setting Detection Interval: 350 ms
Zone Setting Detection Interval: 370 ms

## 2014-03-31 ENCOUNTER — Encounter: Payer: Self-pay | Admitting: Cardiology

## 2014-04-01 NOTE — Progress Notes (Signed)
Remote ICD transmission.   

## 2014-04-18 ENCOUNTER — Ambulatory Visit (INDEPENDENT_AMBULATORY_CARE_PROVIDER_SITE_OTHER): Payer: Medicare Other | Admitting: *Deleted

## 2014-04-18 ENCOUNTER — Encounter: Payer: Self-pay | Admitting: Internal Medicine

## 2014-04-18 DIAGNOSIS — I428 Other cardiomyopathies: Secondary | ICD-10-CM

## 2014-04-18 NOTE — Progress Notes (Signed)
Remote ICD transmission.   

## 2014-04-20 LAB — MDC_IDC_ENUM_SESS_TYPE_REMOTE
Battery Voltage: 2.62 V
Brady Statistic AP VP Percent: 0.07 %
Brady Statistic RV Percent Paced: 0.07 %
HIGH POWER IMPEDANCE MEASURED VALUE: 41 Ohm
HighPow Impedance: 50 Ohm
Lead Channel Sensing Intrinsic Amplitude: 3.4304
Lead Channel Setting Pacing Amplitude: 2.5 V
Lead Channel Setting Pacing Pulse Width: 0.4 ms
Lead Channel Setting Sensing Sensitivity: 0.3 mV
MDC IDC MSMT LEADCHNL RA IMPEDANCE VALUE: 528 Ohm
MDC IDC MSMT LEADCHNL RA SENSING INTR AMPL: 1.4462
MDC IDC MSMT LEADCHNL RV IMPEDANCE VALUE: 384 Ohm
MDC IDC SESS DTM: 20150601130109
MDC IDC SET LEADCHNL RA PACING AMPLITUDE: 2 V
MDC IDC SET ZONE DETECTION INTERVAL: 300 ms
MDC IDC STAT BRADY AP VS PERCENT: 97.98 %
MDC IDC STAT BRADY AS VP PERCENT: 0 %
MDC IDC STAT BRADY AS VS PERCENT: 1.95 %
MDC IDC STAT BRADY RA PERCENT PACED: 98.05 %
Zone Setting Detection Interval: 340 ms
Zone Setting Detection Interval: 350 ms
Zone Setting Detection Interval: 370 ms

## 2014-04-21 DIAGNOSIS — I509 Heart failure, unspecified: Secondary | ICD-10-CM | POA: Diagnosis not present

## 2014-04-21 DIAGNOSIS — I10 Essential (primary) hypertension: Secondary | ICD-10-CM | POA: Diagnosis not present

## 2014-04-21 DIAGNOSIS — I4891 Unspecified atrial fibrillation: Secondary | ICD-10-CM | POA: Diagnosis not present

## 2014-04-21 DIAGNOSIS — N189 Chronic kidney disease, unspecified: Secondary | ICD-10-CM | POA: Diagnosis not present

## 2014-04-21 DIAGNOSIS — E039 Hypothyroidism, unspecified: Secondary | ICD-10-CM | POA: Diagnosis not present

## 2014-04-21 DIAGNOSIS — M1990 Unspecified osteoarthritis, unspecified site: Secondary | ICD-10-CM | POA: Diagnosis not present

## 2014-04-27 ENCOUNTER — Other Ambulatory Visit: Payer: Self-pay | Admitting: Internal Medicine

## 2014-04-28 DIAGNOSIS — I4891 Unspecified atrial fibrillation: Secondary | ICD-10-CM | POA: Diagnosis not present

## 2014-04-28 DIAGNOSIS — I509 Heart failure, unspecified: Secondary | ICD-10-CM | POA: Diagnosis not present

## 2014-04-28 DIAGNOSIS — R269 Unspecified abnormalities of gait and mobility: Secondary | ICD-10-CM | POA: Diagnosis not present

## 2014-04-28 DIAGNOSIS — N189 Chronic kidney disease, unspecified: Secondary | ICD-10-CM | POA: Diagnosis not present

## 2014-04-28 DIAGNOSIS — N400 Enlarged prostate without lower urinary tract symptoms: Secondary | ICD-10-CM | POA: Diagnosis not present

## 2014-04-28 DIAGNOSIS — I10 Essential (primary) hypertension: Secondary | ICD-10-CM | POA: Diagnosis not present

## 2014-04-28 DIAGNOSIS — E039 Hypothyroidism, unspecified: Secondary | ICD-10-CM | POA: Diagnosis not present

## 2014-04-28 DIAGNOSIS — I951 Orthostatic hypotension: Secondary | ICD-10-CM | POA: Diagnosis not present

## 2014-04-29 ENCOUNTER — Encounter: Payer: Self-pay | Admitting: Cardiology

## 2014-05-25 DIAGNOSIS — H04129 Dry eye syndrome of unspecified lacrimal gland: Secondary | ICD-10-CM | POA: Diagnosis not present

## 2014-05-25 DIAGNOSIS — H26499 Other secondary cataract, unspecified eye: Secondary | ICD-10-CM | POA: Diagnosis not present

## 2014-05-30 ENCOUNTER — Other Ambulatory Visit: Payer: Self-pay | Admitting: Internal Medicine

## 2014-06-06 ENCOUNTER — Encounter: Payer: Self-pay | Admitting: Internal Medicine

## 2014-06-06 ENCOUNTER — Ambulatory Visit (INDEPENDENT_AMBULATORY_CARE_PROVIDER_SITE_OTHER): Payer: Medicare Other | Admitting: Internal Medicine

## 2014-06-06 VITALS — BP 126/72 | HR 66 | Ht 75.0 in | Wt 177.0 lb

## 2014-06-06 DIAGNOSIS — Z95810 Presence of automatic (implantable) cardiac defibrillator: Secondary | ICD-10-CM | POA: Diagnosis not present

## 2014-06-06 DIAGNOSIS — I5022 Chronic systolic (congestive) heart failure: Secondary | ICD-10-CM | POA: Diagnosis not present

## 2014-06-06 DIAGNOSIS — I4949 Other premature depolarization: Secondary | ICD-10-CM

## 2014-06-06 DIAGNOSIS — I493 Ventricular premature depolarization: Secondary | ICD-10-CM

## 2014-06-06 DIAGNOSIS — I428 Other cardiomyopathies: Secondary | ICD-10-CM | POA: Diagnosis not present

## 2014-06-06 LAB — MDC_IDC_ENUM_SESS_TYPE_INCLINIC
Battery Voltage: 2.62 V
Brady Statistic AP VP Percent: 0.11 %
Brady Statistic AP VS Percent: 97.56 %
Brady Statistic AS VP Percent: 0 %
Brady Statistic RA Percent Paced: 97.67 %
Brady Statistic RV Percent Paced: 0.11 %
Date Time Interrogation Session: 20150720133350
HIGH POWER IMPEDANCE MEASURED VALUE: 50 Ohm
HighPow Impedance: 41 Ohm
Lead Channel Impedance Value: 408 Ohm
Lead Channel Impedance Value: 504 Ohm
Lead Channel Pacing Threshold Amplitude: 0.5 V
Lead Channel Pacing Threshold Pulse Width: 0.4 ms
Lead Channel Sensing Intrinsic Amplitude: 2.3665
Lead Channel Setting Pacing Amplitude: 2.5 V
Lead Channel Setting Pacing Pulse Width: 0.4 ms
Lead Channel Setting Sensing Sensitivity: 0.3 mV
MDC IDC MSMT LEADCHNL RV PACING THRESHOLD AMPLITUDE: 0.5 V
MDC IDC MSMT LEADCHNL RV PACING THRESHOLD PULSEWIDTH: 0.4 ms
MDC IDC MSMT LEADCHNL RV SENSING INTR AMPL: 5.1456
MDC IDC SET LEADCHNL RA PACING AMPLITUDE: 2 V
MDC IDC SET ZONE DETECTION INTERVAL: 340 ms
MDC IDC STAT BRADY AS VS PERCENT: 2.33 %
Zone Setting Detection Interval: 300 ms
Zone Setting Detection Interval: 350 ms
Zone Setting Detection Interval: 370 ms

## 2014-06-06 NOTE — Patient Instructions (Addendum)
Remote monitoring is used to monitor your Pacemaker of ICD from home. This monitoring reduces the number of office visits required to check your device to one time per year. It allows [place] to keep an eye on the functioning of your device to ensure it is working properly. You are scheduled for a device check from home on 07/04/14. You may send your transmission at any time that day. If you have a wireless device, the transmission will be sent automatically. After your physician reviews your transmission, you will receive a postcard with your next transmission date.  We will check your device monthly for now, because your generator battery is nearing replacement.  Your physician recommends that you continue on your current medications as directed. Please refer to the Current Medication list given to you today.

## 2014-06-06 NOTE — Assessment & Plan Note (Signed)
His very dense ventricular ectopy is much better. He has 10 PVC's an hour. He will continue low dose amio.

## 2014-06-06 NOTE — Assessment & Plan Note (Signed)
His Medtronic ICD is working normally. He is approaching ERI.

## 2014-06-06 NOTE — Progress Notes (Signed)
HPI [name] returns today for followup. 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 In the interim he is much improved. His CHF symptoms are class 2a, having improved from his symptoms of 3B CHF. He denies palpitations. No other complaints. His dizziness has resolved. He is tending a garden and has been all summer.  No Known Allergies   Current Outpatient Prescriptions  Medication Sig Dispense Refill  . amiodarone (PACERONE) 200 MG tablet TAKE 1 TABLET DAILY MONDAY THRU FRIDAY AND TAKE 1/2 TABLET DAILY ON SAT/SUN  30 tablet  2  . aspirin (ASPIR-81) 81 MG EC tablet Take 81 mg by mouth daily.        . furosemide (LASIX) 40 MG tablet Take 20 mg by mouth as needed. Dr. Quintin Alto (PCP) told him to take this only as needed      . levothyroxine (SYNTHROID, LEVOTHROID) 50 MCG tablet Take 50 mcg by mouth daily.        Marland Kitchen losartan (COZAAR) 25 MG tablet TAKE ONE (1) TABLET EACH DAY  90 tablet  0  . metoprolol tartrate (LOPRESSOR) 25 MG tablet Take 1/2 tablet twice daily      . Tamsulosin HCl (FLOMAX) 0.4 MG CAPS Take 0.4 mg by mouth daily.       No current facility-administered medications for this visit.     Past Medical History  Diagnosis Date  . DCM (dilated cardiomyopathy)   . CAD (coronary artery disease)     minimal by cath  . CHF NYHA class II   . Automatic implantable cardiac defibrillator in situ     ROS:   All systems reviewed and negative except as noted in the HPI.   Past Surgical History  Procedure Laterality Date  . S/p icd  2009  . Cardiac catheterization  12/17/07     Family History  Problem Relation Age of Onset  . Tuberculosis Father      History   Social History  . Marital Status: Married    Spouse Name: N/A    Number of Children: N/A  . Years of Education: N/A   Occupational History  . Not on file.   Social History Main Topics  . Smoking status: Never Smoker   .  Smokeless tobacco: Not on file  . Alcohol Use: No  . Drug Use:   . Sexual Activity:    Other Topics Concern  . Not on file   Social History Narrative   Retired.      BP 126/72  Pulse 66  Ht 6\' 3"  (1.905 m)  Wt 177 lb (80.287 kg)  BMI 22.12 kg/m2  Physical Exam:  Well appearing 80 yo man, NAD HEENT: Unremarkable Neck:  No JVD, no thyromegally Lungs:  Clear with no wheezes, rales, or rhonchi HEART:  Regular rate rhythm, no murmurs, no rubs, no clicks Abd:  soft, positive bowel sounds, no organomegally, no rebound, no guarding Ext:  2 plus pulses, no edema, no cyanosis, no clubbing Skin:  No rashes no nodules Neuro:  CN II through XII intact, motor grossly intact  DEVICE  Normal device function.  See PaceArt for details.   Assess/Plan:

## 2014-06-06 NOTE — Assessment & Plan Note (Signed)
His symptoms are class 2. He will continue his current meds.  

## 2014-07-04 ENCOUNTER — Ambulatory Visit (INDEPENDENT_AMBULATORY_CARE_PROVIDER_SITE_OTHER): Payer: Medicare Other | Admitting: *Deleted

## 2014-07-04 DIAGNOSIS — Z95810 Presence of automatic (implantable) cardiac defibrillator: Secondary | ICD-10-CM

## 2014-07-04 LAB — MDC_IDC_ENUM_SESS_TYPE_REMOTE
Brady Statistic AP VP Percent: 0.21 %
Brady Statistic AP VS Percent: 99.12 %
Brady Statistic AS VS Percent: 0.67 %
Brady Statistic RV Percent Paced: 0.21 %
HighPow Impedance: 41 Ohm
HighPow Impedance: 51 Ohm
Lead Channel Impedance Value: 392 Ohm
Lead Channel Sensing Intrinsic Amplitude: 1.4462
Lead Channel Setting Pacing Amplitude: 2 V
Lead Channel Setting Pacing Pulse Width: 0.4 ms
MDC IDC MSMT BATTERY VOLTAGE: 2.62 V
MDC IDC MSMT LEADCHNL RA IMPEDANCE VALUE: 560 Ohm
MDC IDC MSMT LEADCHNL RV SENSING INTR AMPL: 3.7734
MDC IDC SESS DTM: 20150817132256
MDC IDC SET LEADCHNL RV PACING AMPLITUDE: 2.5 V
MDC IDC SET LEADCHNL RV SENSING SENSITIVITY: 0.3 mV
MDC IDC SET ZONE DETECTION INTERVAL: 350 ms
MDC IDC SET ZONE DETECTION INTERVAL: 370 ms
MDC IDC STAT BRADY AS VP PERCENT: 0 %
MDC IDC STAT BRADY RA PERCENT PACED: 99.33 %
Zone Setting Detection Interval: 300 ms
Zone Setting Detection Interval: 340 ms

## 2014-07-04 NOTE — Progress Notes (Signed)
Remote ICD transmission.   

## 2014-07-14 ENCOUNTER — Encounter: Payer: Self-pay | Admitting: Cardiology

## 2014-07-21 ENCOUNTER — Encounter: Payer: Self-pay | Admitting: Internal Medicine

## 2014-07-27 ENCOUNTER — Encounter: Payer: Self-pay | Admitting: Internal Medicine

## 2014-08-08 ENCOUNTER — Ambulatory Visit (INDEPENDENT_AMBULATORY_CARE_PROVIDER_SITE_OTHER): Payer: Medicare Other | Admitting: *Deleted

## 2014-08-08 ENCOUNTER — Encounter: Payer: Self-pay | Admitting: Internal Medicine

## 2014-08-08 DIAGNOSIS — Z95810 Presence of automatic (implantable) cardiac defibrillator: Secondary | ICD-10-CM

## 2014-08-08 LAB — MDC_IDC_ENUM_SESS_TYPE_REMOTE
Battery Voltage: 2.62 V
Brady Statistic AP VP Percent: 0.26 %
Brady Statistic AP VS Percent: 98.7 %
Brady Statistic AS VP Percent: 0 %
Brady Statistic RA Percent Paced: 98.96 %
Brady Statistic RV Percent Paced: 0.26 %
Date Time Interrogation Session: 20150921125747
HIGH POWER IMPEDANCE MEASURED VALUE: 48 Ohm
HighPow Impedance: 40 Ohm
Lead Channel Impedance Value: 552 Ohm
Lead Channel Sensing Intrinsic Amplitude: 4.1165
Lead Channel Setting Pacing Amplitude: 2 V
Lead Channel Setting Pacing Amplitude: 2.5 V
Lead Channel Setting Pacing Pulse Width: 0.4 ms
Lead Channel Setting Sensing Sensitivity: 0.3 mV
MDC IDC MSMT LEADCHNL RA SENSING INTR AMPL: 1.1833
MDC IDC MSMT LEADCHNL RV IMPEDANCE VALUE: 392 Ohm
MDC IDC SET ZONE DETECTION INTERVAL: 340 ms
MDC IDC STAT BRADY AS VS PERCENT: 1.04 %
Zone Setting Detection Interval: 300 ms
Zone Setting Detection Interval: 350 ms
Zone Setting Detection Interval: 370 ms

## 2014-08-08 NOTE — Progress Notes (Signed)
Remote ICD transmission.   

## 2014-08-22 ENCOUNTER — Encounter: Payer: Self-pay | Admitting: Cardiology

## 2014-09-07 ENCOUNTER — Ambulatory Visit (INDEPENDENT_AMBULATORY_CARE_PROVIDER_SITE_OTHER): Payer: Medicare Other | Admitting: *Deleted

## 2014-09-07 DIAGNOSIS — I429 Cardiomyopathy, unspecified: Secondary | ICD-10-CM

## 2014-09-07 DIAGNOSIS — I428 Other cardiomyopathies: Secondary | ICD-10-CM

## 2014-09-07 LAB — MDC_IDC_ENUM_SESS_TYPE_REMOTE
Battery Voltage: 2.62 V
Brady Statistic AP VS Percent: 98.67 %
Brady Statistic AS VP Percent: 0 %
Brady Statistic AS VS Percent: 1.21 %
Brady Statistic RA Percent Paced: 98.79 %
Date Time Interrogation Session: 20151021125629
HighPow Impedance: 40 Ohm
HighPow Impedance: 48 Ohm
Lead Channel Sensing Intrinsic Amplitude: 1.1833
Lead Channel Sensing Intrinsic Amplitude: 4.1165
Lead Channel Setting Pacing Amplitude: 2 V
Lead Channel Setting Pacing Pulse Width: 0.4 ms
Lead Channel Setting Sensing Sensitivity: 0.3 mV
MDC IDC MSMT LEADCHNL RA IMPEDANCE VALUE: 544 Ohm
MDC IDC MSMT LEADCHNL RV IMPEDANCE VALUE: 408 Ohm
MDC IDC SET LEADCHNL RV PACING AMPLITUDE: 2.5 V
MDC IDC SET ZONE DETECTION INTERVAL: 300 ms
MDC IDC SET ZONE DETECTION INTERVAL: 350 ms
MDC IDC STAT BRADY AP VP PERCENT: 0.12 %
MDC IDC STAT BRADY RV PERCENT PACED: 0.12 %
Zone Setting Detection Interval: 340 ms
Zone Setting Detection Interval: 370 ms

## 2014-09-07 NOTE — Progress Notes (Signed)
Remote ICD transmission.   

## 2014-10-04 ENCOUNTER — Encounter: Payer: Self-pay | Admitting: Cardiology

## 2014-10-05 ENCOUNTER — Telehealth: Payer: Self-pay | Admitting: Internal Medicine

## 2014-10-05 NOTE — Telephone Encounter (Signed)
New msg  Pt hasn't received a report of when to  send in his paperwork for his defib and would like a call back.

## 2014-10-06 NOTE — Telephone Encounter (Signed)
Updated pt next transmission for a battery check is 10/10/14 anytime in the morning.

## 2014-10-10 ENCOUNTER — Ambulatory Visit (INDEPENDENT_AMBULATORY_CARE_PROVIDER_SITE_OTHER): Payer: Medicare Other | Admitting: *Deleted

## 2014-10-10 DIAGNOSIS — I428 Other cardiomyopathies: Secondary | ICD-10-CM

## 2014-10-10 DIAGNOSIS — I429 Cardiomyopathy, unspecified: Secondary | ICD-10-CM

## 2014-10-10 LAB — MDC_IDC_ENUM_SESS_TYPE_REMOTE
Battery Voltage: 2.62 V
Brady Statistic AP VP Percent: 0.16 %
Brady Statistic AP VS Percent: 97.99 %
Brady Statistic AS VP Percent: 0 %
Brady Statistic AS VS Percent: 1.86 %
Date Time Interrogation Session: 20151123124930
HIGH POWER IMPEDANCE MEASURED VALUE: 49 Ohm
HighPow Impedance: 41 Ohm
Lead Channel Impedance Value: 400 Ohm
Lead Channel Impedance Value: 568 Ohm
Lead Channel Sensing Intrinsic Amplitude: 1.4462
Lead Channel Sensing Intrinsic Amplitude: 3.7734
Lead Channel Setting Pacing Amplitude: 2 V
Lead Channel Setting Pacing Amplitude: 2.5 V
Lead Channel Setting Sensing Sensitivity: 0.3 mV
MDC IDC SET LEADCHNL RV PACING PULSEWIDTH: 0.4 ms
MDC IDC SET ZONE DETECTION INTERVAL: 300 ms
MDC IDC SET ZONE DETECTION INTERVAL: 340 ms
MDC IDC STAT BRADY RA PERCENT PACED: 98.14 %
MDC IDC STAT BRADY RV PERCENT PACED: 0.16 %
Zone Setting Detection Interval: 350 ms
Zone Setting Detection Interval: 370 ms

## 2014-10-10 NOTE — Progress Notes (Signed)
Remote ICD transmission.   

## 2014-10-12 ENCOUNTER — Encounter: Payer: Self-pay | Admitting: Cardiology

## 2014-10-12 DIAGNOSIS — I10 Essential (primary) hypertension: Secondary | ICD-10-CM | POA: Diagnosis not present

## 2014-10-12 DIAGNOSIS — N189 Chronic kidney disease, unspecified: Secondary | ICD-10-CM | POA: Diagnosis not present

## 2014-10-12 DIAGNOSIS — E039 Hypothyroidism, unspecified: Secondary | ICD-10-CM | POA: Diagnosis not present

## 2014-10-12 DIAGNOSIS — I482 Chronic atrial fibrillation: Secondary | ICD-10-CM | POA: Diagnosis not present

## 2014-10-20 DIAGNOSIS — I482 Chronic atrial fibrillation: Secondary | ICD-10-CM | POA: Diagnosis not present

## 2014-10-20 DIAGNOSIS — N401 Enlarged prostate with lower urinary tract symptoms: Secondary | ICD-10-CM | POA: Diagnosis not present

## 2014-10-20 DIAGNOSIS — E039 Hypothyroidism, unspecified: Secondary | ICD-10-CM | POA: Diagnosis not present

## 2014-10-20 DIAGNOSIS — N183 Chronic kidney disease, stage 3 (moderate): Secondary | ICD-10-CM | POA: Diagnosis not present

## 2014-10-20 DIAGNOSIS — R2689 Other abnormalities of gait and mobility: Secondary | ICD-10-CM | POA: Diagnosis not present

## 2014-10-20 DIAGNOSIS — Z1389 Encounter for screening for other disorder: Secondary | ICD-10-CM | POA: Diagnosis not present

## 2014-10-20 DIAGNOSIS — I10 Essential (primary) hypertension: Secondary | ICD-10-CM | POA: Diagnosis not present

## 2014-10-21 ENCOUNTER — Encounter: Payer: Self-pay | Admitting: Internal Medicine

## 2014-10-28 ENCOUNTER — Encounter: Payer: Self-pay | Admitting: Internal Medicine

## 2014-11-09 ENCOUNTER — Ambulatory Visit (INDEPENDENT_AMBULATORY_CARE_PROVIDER_SITE_OTHER): Payer: Medicare Other | Admitting: *Deleted

## 2014-11-09 ENCOUNTER — Encounter: Payer: Self-pay | Admitting: Internal Medicine

## 2014-11-09 DIAGNOSIS — I429 Cardiomyopathy, unspecified: Secondary | ICD-10-CM

## 2014-11-09 DIAGNOSIS — I428 Other cardiomyopathies: Secondary | ICD-10-CM

## 2014-11-09 LAB — MDC_IDC_ENUM_SESS_TYPE_REMOTE
Battery Voltage: 2.62 V
Brady Statistic AP VP Percent: 0.1 %
Brady Statistic AP VS Percent: 97.2 %
Brady Statistic AS VS Percent: 2.7 %
HIGH POWER IMPEDANCE MEASURED VALUE: 40 Ohm
Lead Channel Impedance Value: 376 Ohm
Lead Channel Sensing Intrinsic Amplitude: 1.2 mV
Lead Channel Setting Pacing Amplitude: 2 V
Lead Channel Setting Pacing Amplitude: 2.5 V
Lead Channel Setting Pacing Pulse Width: 0.4 ms
MDC IDC MSMT LEADCHNL RA IMPEDANCE VALUE: 560 Ohm
MDC IDC MSMT LEADCHNL RV SENSING INTR AMPL: 3.8 mV
MDC IDC SET LEADCHNL RV SENSING SENSITIVITY: 0.3 mV
MDC IDC SET ZONE DETECTION INTERVAL: 370 ms
MDC IDC STAT BRADY AS VP PERCENT: 0.1 %
Zone Setting Detection Interval: 300 ms
Zone Setting Detection Interval: 340 ms
Zone Setting Detection Interval: 350 ms

## 2014-11-09 NOTE — Progress Notes (Signed)
Remote ICD transmission.   

## 2014-11-15 ENCOUNTER — Encounter: Payer: Self-pay | Admitting: Cardiology

## 2014-12-08 ENCOUNTER — Ambulatory Visit (INDEPENDENT_AMBULATORY_CARE_PROVIDER_SITE_OTHER): Payer: Medicare Other | Admitting: *Deleted

## 2014-12-08 DIAGNOSIS — I429 Cardiomyopathy, unspecified: Secondary | ICD-10-CM | POA: Diagnosis not present

## 2014-12-08 DIAGNOSIS — I428 Other cardiomyopathies: Secondary | ICD-10-CM

## 2014-12-08 NOTE — Progress Notes (Signed)
Remote ICD transmission.   

## 2014-12-12 LAB — MDC_IDC_ENUM_SESS_TYPE_REMOTE
Battery Voltage: 2.62 V
Brady Statistic AP VP Percent: 0.05 %
Brady Statistic AS VP Percent: 0 %
Brady Statistic RV Percent Paced: 0.05 %
HIGH POWER IMPEDANCE MEASURED VALUE: 42 Ohm
HIGH POWER IMPEDANCE MEASURED VALUE: 51 Ohm
Lead Channel Impedance Value: 400 Ohm
Lead Channel Impedance Value: 592 Ohm
Lead Channel Sensing Intrinsic Amplitude: 1.4462
Lead Channel Sensing Intrinsic Amplitude: 3.7734
Lead Channel Setting Pacing Amplitude: 2 V
Lead Channel Setting Pacing Pulse Width: 0.4 ms
Lead Channel Setting Sensing Sensitivity: 0.3 mV
MDC IDC SESS DTM: 20160121150008
MDC IDC SET LEADCHNL RV PACING AMPLITUDE: 2.5 V
MDC IDC SET ZONE DETECTION INTERVAL: 370 ms
MDC IDC STAT BRADY AP VS PERCENT: 97.9 %
MDC IDC STAT BRADY AS VS PERCENT: 2.06 %
MDC IDC STAT BRADY RA PERCENT PACED: 97.94 %
Zone Setting Detection Interval: 300 ms
Zone Setting Detection Interval: 340 ms
Zone Setting Detection Interval: 350 ms

## 2014-12-21 ENCOUNTER — Encounter: Payer: Self-pay | Admitting: Cardiology

## 2015-01-06 ENCOUNTER — Other Ambulatory Visit: Payer: Self-pay | Admitting: Internal Medicine

## 2015-01-09 ENCOUNTER — Ambulatory Visit (INDEPENDENT_AMBULATORY_CARE_PROVIDER_SITE_OTHER): Payer: Medicare Other | Admitting: *Deleted

## 2015-01-09 DIAGNOSIS — I429 Cardiomyopathy, unspecified: Secondary | ICD-10-CM

## 2015-01-09 DIAGNOSIS — I428 Other cardiomyopathies: Secondary | ICD-10-CM

## 2015-01-09 NOTE — Progress Notes (Signed)
Remote ICD transmission.   

## 2015-01-11 LAB — MDC_IDC_ENUM_SESS_TYPE_REMOTE
Brady Statistic AS VS Percent: 2.37 %
Brady Statistic RA Percent Paced: 97.63 %
Brady Statistic RV Percent Paced: 0.19 %
Date Time Interrogation Session: 20160222150052
HIGH POWER IMPEDANCE MEASURED VALUE: 41 Ohm
HighPow Impedance: 50 Ohm
Lead Channel Impedance Value: 392 Ohm
Lead Channel Setting Pacing Amplitude: 2 V
Lead Channel Setting Pacing Pulse Width: 0.4 ms
Lead Channel Setting Sensing Sensitivity: 0.3 mV
MDC IDC MSMT BATTERY VOLTAGE: 2.62 V
MDC IDC MSMT LEADCHNL RA IMPEDANCE VALUE: 552 Ohm
MDC IDC MSMT LEADCHNL RA SENSING INTR AMPL: 1.4462
MDC IDC MSMT LEADCHNL RV SENSING INTR AMPL: 4.1165
MDC IDC SET LEADCHNL RV PACING AMPLITUDE: 2.5 V
MDC IDC SET ZONE DETECTION INTERVAL: 300 ms
MDC IDC SET ZONE DETECTION INTERVAL: 350 ms
MDC IDC STAT BRADY AP VP PERCENT: 0.19 %
MDC IDC STAT BRADY AP VS PERCENT: 97.44 %
MDC IDC STAT BRADY AS VP PERCENT: 0 %
Zone Setting Detection Interval: 340 ms
Zone Setting Detection Interval: 370 ms

## 2015-01-20 ENCOUNTER — Encounter: Payer: Self-pay | Admitting: Cardiology

## 2015-01-25 ENCOUNTER — Encounter: Payer: Self-pay | Admitting: Internal Medicine

## 2015-02-06 ENCOUNTER — Encounter: Payer: Self-pay | Admitting: Internal Medicine

## 2015-02-07 ENCOUNTER — Ambulatory Visit (INDEPENDENT_AMBULATORY_CARE_PROVIDER_SITE_OTHER): Payer: Medicare Other | Admitting: *Deleted

## 2015-02-07 DIAGNOSIS — I429 Cardiomyopathy, unspecified: Secondary | ICD-10-CM

## 2015-02-07 DIAGNOSIS — I428 Other cardiomyopathies: Secondary | ICD-10-CM

## 2015-02-07 LAB — MDC_IDC_ENUM_SESS_TYPE_REMOTE
Battery Voltage: 2.62 V
Brady Statistic AP VP Percent: 0.14 %
Brady Statistic AP VS Percent: 97.91 %
Brady Statistic AS VP Percent: 0 %
HighPow Impedance: 43 Ohm
HighPow Impedance: 52 Ohm
Lead Channel Impedance Value: 584 Ohm
Lead Channel Sensing Intrinsic Amplitude: 1.4462
Lead Channel Sensing Intrinsic Amplitude: 3.4304
Lead Channel Setting Pacing Pulse Width: 0.4 ms
Lead Channel Setting Sensing Sensitivity: 0.3 mV
MDC IDC MSMT LEADCHNL RV IMPEDANCE VALUE: 384 Ohm
MDC IDC SESS DTM: 20160322130102
MDC IDC SET LEADCHNL RA PACING AMPLITUDE: 2 V
MDC IDC SET LEADCHNL RV PACING AMPLITUDE: 2.5 V
MDC IDC SET ZONE DETECTION INTERVAL: 340 ms
MDC IDC STAT BRADY AS VS PERCENT: 1.95 %
MDC IDC STAT BRADY RA PERCENT PACED: 98.05 %
MDC IDC STAT BRADY RV PERCENT PACED: 0.14 %
Zone Setting Detection Interval: 300 ms
Zone Setting Detection Interval: 350 ms
Zone Setting Detection Interval: 370 ms

## 2015-02-07 NOTE — Progress Notes (Signed)
Remote ICD transmission.   

## 2015-02-23 ENCOUNTER — Encounter: Payer: Self-pay | Admitting: Cardiology

## 2015-02-27 ENCOUNTER — Encounter: Payer: Self-pay | Admitting: Internal Medicine

## 2015-03-09 ENCOUNTER — Encounter: Payer: Self-pay | Admitting: Internal Medicine

## 2015-03-09 ENCOUNTER — Encounter: Payer: Medicare Other | Admitting: *Deleted

## 2015-03-09 ENCOUNTER — Ambulatory Visit (INDEPENDENT_AMBULATORY_CARE_PROVIDER_SITE_OTHER): Payer: Medicare Other | Admitting: *Deleted

## 2015-03-09 DIAGNOSIS — I429 Cardiomyopathy, unspecified: Secondary | ICD-10-CM

## 2015-03-09 DIAGNOSIS — I428 Other cardiomyopathies: Secondary | ICD-10-CM

## 2015-03-09 NOTE — Progress Notes (Signed)
Remote ICD transmission.   

## 2015-03-12 LAB — MDC_IDC_ENUM_SESS_TYPE_REMOTE
Brady Statistic AP VP Percent: 0.35 %
Brady Statistic AP VS Percent: 95.34 %
Brady Statistic AS VS Percent: 4.31 %
Brady Statistic RA Percent Paced: 95.69 %
Brady Statistic RV Percent Paced: 0.35 %
HighPow Impedance: 40 Ohm
HighPow Impedance: 49 Ohm
Lead Channel Impedance Value: 536 Ohm
Lead Channel Setting Pacing Amplitude: 2 V
Lead Channel Setting Pacing Amplitude: 2.5 V
Lead Channel Setting Sensing Sensitivity: 0.3 mV
MDC IDC MSMT BATTERY VOLTAGE: 2.62 V
MDC IDC MSMT LEADCHNL RA SENSING INTR AMPL: 1.315 mV
MDC IDC MSMT LEADCHNL RV IMPEDANCE VALUE: 392 Ohm
MDC IDC MSMT LEADCHNL RV SENSING INTR AMPL: 3.43 mV
MDC IDC SESS DTM: 20160421141119
MDC IDC SET LEADCHNL RV PACING PULSEWIDTH: 0.4 ms
MDC IDC SET ZONE DETECTION INTERVAL: 340 ms
MDC IDC STAT BRADY AS VP PERCENT: 0 %
Zone Setting Detection Interval: 300 ms
Zone Setting Detection Interval: 350 ms
Zone Setting Detection Interval: 370 ms

## 2015-03-17 ENCOUNTER — Encounter: Payer: Self-pay | Admitting: Cardiology

## 2015-04-10 ENCOUNTER — Ambulatory Visit (INDEPENDENT_AMBULATORY_CARE_PROVIDER_SITE_OTHER): Payer: Medicare Other | Admitting: *Deleted

## 2015-04-10 DIAGNOSIS — I428 Other cardiomyopathies: Secondary | ICD-10-CM

## 2015-04-10 DIAGNOSIS — I429 Cardiomyopathy, unspecified: Secondary | ICD-10-CM

## 2015-04-10 NOTE — Progress Notes (Signed)
Remote ICD transmission.   

## 2015-04-11 LAB — CUP PACEART REMOTE DEVICE CHECK
Battery Voltage: 2.62 V
Brady Statistic AP VS Percent: 98.36 %
Brady Statistic AS VP Percent: 0 %
Brady Statistic AS VS Percent: 1.36 %
Date Time Interrogation Session: 20160523125507
HighPow Impedance: 40 Ohm
HighPow Impedance: 50 Ohm
Lead Channel Impedance Value: 384 Ohm
Lead Channel Impedance Value: 552 Ohm
Lead Channel Sensing Intrinsic Amplitude: 1.315 mV
Lead Channel Setting Pacing Amplitude: 2.5 V
MDC IDC MSMT LEADCHNL RV SENSING INTR AMPL: 3.43 mV
MDC IDC SET LEADCHNL RA PACING AMPLITUDE: 2 V
MDC IDC SET LEADCHNL RV PACING PULSEWIDTH: 0.4 ms
MDC IDC SET LEADCHNL RV SENSING SENSITIVITY: 0.3 mV
MDC IDC SET ZONE DETECTION INTERVAL: 300 ms
MDC IDC SET ZONE DETECTION INTERVAL: 340 ms
MDC IDC SET ZONE DETECTION INTERVAL: 370 ms
MDC IDC STAT BRADY AP VP PERCENT: 0.28 %
MDC IDC STAT BRADY RA PERCENT PACED: 98.64 %
MDC IDC STAT BRADY RV PERCENT PACED: 0.28 %
Zone Setting Detection Interval: 350 ms

## 2015-04-13 DIAGNOSIS — I10 Essential (primary) hypertension: Secondary | ICD-10-CM | POA: Diagnosis not present

## 2015-04-13 DIAGNOSIS — N183 Chronic kidney disease, stage 3 (moderate): Secondary | ICD-10-CM | POA: Diagnosis not present

## 2015-04-13 DIAGNOSIS — E039 Hypothyroidism, unspecified: Secondary | ICD-10-CM | POA: Diagnosis not present

## 2015-04-19 ENCOUNTER — Encounter: Payer: Self-pay | Admitting: Cardiology

## 2015-04-20 DIAGNOSIS — E039 Hypothyroidism, unspecified: Secondary | ICD-10-CM | POA: Diagnosis not present

## 2015-04-20 DIAGNOSIS — I10 Essential (primary) hypertension: Secondary | ICD-10-CM | POA: Diagnosis not present

## 2015-04-20 DIAGNOSIS — I482 Chronic atrial fibrillation: Secondary | ICD-10-CM | POA: Diagnosis not present

## 2015-04-20 DIAGNOSIS — N401 Enlarged prostate with lower urinary tract symptoms: Secondary | ICD-10-CM | POA: Diagnosis not present

## 2015-04-20 DIAGNOSIS — N183 Chronic kidney disease, stage 3 (moderate): Secondary | ICD-10-CM | POA: Diagnosis not present

## 2015-04-20 DIAGNOSIS — R2681 Unsteadiness on feet: Secondary | ICD-10-CM | POA: Diagnosis not present

## 2015-04-24 ENCOUNTER — Other Ambulatory Visit: Payer: Self-pay | Admitting: Internal Medicine

## 2015-04-26 ENCOUNTER — Encounter: Payer: Self-pay | Admitting: Internal Medicine

## 2015-05-31 DIAGNOSIS — H26493 Other secondary cataract, bilateral: Secondary | ICD-10-CM | POA: Diagnosis not present

## 2015-05-31 DIAGNOSIS — H04123 Dry eye syndrome of bilateral lacrimal glands: Secondary | ICD-10-CM | POA: Diagnosis not present

## 2015-06-07 ENCOUNTER — Encounter: Payer: Self-pay | Admitting: Internal Medicine

## 2015-06-07 ENCOUNTER — Ambulatory Visit (INDEPENDENT_AMBULATORY_CARE_PROVIDER_SITE_OTHER): Payer: Medicare Other | Admitting: Internal Medicine

## 2015-06-07 VITALS — BP 130/68 | HR 63 | Ht 75.0 in | Wt 174.6 lb

## 2015-06-07 DIAGNOSIS — I5022 Chronic systolic (congestive) heart failure: Secondary | ICD-10-CM | POA: Diagnosis not present

## 2015-06-07 DIAGNOSIS — I272 Pulmonary hypertension, unspecified: Secondary | ICD-10-CM

## 2015-06-07 DIAGNOSIS — Z95810 Presence of automatic (implantable) cardiac defibrillator: Secondary | ICD-10-CM

## 2015-06-07 DIAGNOSIS — I270 Primary pulmonary hypertension: Secondary | ICD-10-CM

## 2015-06-07 DIAGNOSIS — I493 Ventricular premature depolarization: Secondary | ICD-10-CM | POA: Diagnosis not present

## 2015-06-07 LAB — CUP PACEART INCLINIC DEVICE CHECK
Battery Voltage: 2.62 V
Brady Statistic AS VP Percent: 0 %
Brady Statistic AS VS Percent: 1.9 %
Brady Statistic RA Percent Paced: 98.1 %
Brady Statistic RV Percent Paced: 0.19 %
HIGH POWER IMPEDANCE MEASURED VALUE: 50 Ohm
HighPow Impedance: 42 Ohm
Lead Channel Pacing Threshold Amplitude: 1 V
Lead Channel Pacing Threshold Pulse Width: 0.4 ms
Lead Channel Sensing Intrinsic Amplitude: 1.3147
Lead Channel Sensing Intrinsic Amplitude: 3.7734
Lead Channel Setting Pacing Amplitude: 2.5 V
Lead Channel Setting Sensing Sensitivity: 0.3 mV
MDC IDC MSMT LEADCHNL RA IMPEDANCE VALUE: 520 Ohm
MDC IDC MSMT LEADCHNL RA PACING THRESHOLD AMPLITUDE: 1 V
MDC IDC MSMT LEADCHNL RV IMPEDANCE VALUE: 392 Ohm
MDC IDC MSMT LEADCHNL RV PACING THRESHOLD PULSEWIDTH: 0.4 ms
MDC IDC SESS DTM: 20160720123924
MDC IDC SET LEADCHNL RA PACING AMPLITUDE: 2 V
MDC IDC SET LEADCHNL RV PACING PULSEWIDTH: 0.4 ms
MDC IDC STAT BRADY AP VP PERCENT: 0.18 %
MDC IDC STAT BRADY AP VS PERCENT: 97.91 %
Zone Setting Detection Interval: 300 ms
Zone Setting Detection Interval: 340 ms
Zone Setting Detection Interval: 350 ms
Zone Setting Detection Interval: 370 ms

## 2015-06-07 NOTE — Assessment & Plan Note (Signed)
His medtronic ICD is working normally. Will recheck in several months. He is approaching ERI.

## 2015-06-07 NOTE — Patient Instructions (Signed)
Your physician recommends that you continue on your current medications as directed. Please refer to the Current Medication list given to you today.   REMOTE CHECK ON 07/10/15    Your physician wants you to follow-up in: [place] will receive a reminder letter in the mail two months in advance. If you don't receive a letter, please call our office to schedule the follow-up appointment.

## 2015-06-07 NOTE — Progress Notes (Signed)
HPI [name] returns today for followup. He is a pleasant 81 yo man with a h/o a non-ischemic CM, chronic systolic heart failure, PVC's which were symptomatic, s/p ICD implant. In the interim he is much improved. His CHF symptoms are class 2a, having improved from his symptoms of 3B CHF. He denies palpitations. No other complaints. His dizziness has resolved. He is tending a garden and has been all summer.  No Known Allergies   Current Outpatient Prescriptions  Medication Sig Dispense Refill  . amiodarone (PACERONE) 200 MG tablet Take 1 tablet by mouth on days Monday-Friday and take 1/2 tablet by mouth on Saturday and Sunday    . furosemide (LASIX) 40 MG tablet Take 20 mg by mouth daily as needed (swelling). Dr. Quintin Alto (PCP) told him to take this only as needed    . levothyroxine (SYNTHROID, LEVOTHROID) 50 MCG tablet Take 50 mcg by mouth daily.      Marland Kitchen losartan (COZAAR) 25 MG tablet Take 25 mg by mouth daily.    . metoprolol tartrate (LOPRESSOR) 25 MG tablet Take 12.5 mg by mouth 2 (two) times daily.    . Tamsulosin HCl (FLOMAX) 0.4 MG CAPS Take 0.4 mg by mouth daily.     No current facility-administered medications for this visit.     Past Medical History  Diagnosis Date  . DCM (dilated cardiomyopathy)   . CAD (coronary artery disease)     minimal by cath  . CHF NYHA class II   . Automatic implantable cardiac defibrillator in situ     ROS:   All systems reviewed and negative except as noted in the HPI.   Past Surgical History  Procedure Laterality Date  . S/p icd  2009  . Cardiac catheterization  12/17/07     Family History  Problem Relation Age of Onset  . Tuberculosis Father      History   Social History  . Marital Status: Married    Spouse Name: N/A  . Number of Children: N/A  . Years of Education: N/A   Occupational History  . Not on file.   Social History Main Topics  . Smoking status: Never Smoker   . Smokeless tobacco: Not on file  . Alcohol Use: No   . Drug Use: Not on file  . Sexual Activity: Not on file   Other Topics Concern  . Not on file   Social History Narrative   Retired.      BP 130/68 mmHg  Pulse 63  Ht '6\' 3"'$  (1.905 m)  Wt 174 lb 9.6 oz (79.198 kg)  BMI 21.82 kg/m2  SpO2 95%  Physical Exam:  Well appearing 81 yo man, NAD HEENT: Unremarkable Neck:  6 cm JVD, no thyromegally Lungs:  Clear with no wheezes, rales, or rhonchi HEART:  Regular rate rhythm, no murmurs, no rubs, no clicks Abd:  soft, positive bowel sounds, no organomegally, no rebound, no guarding Ext:  2 plus pulses, no edema, no cyanosis, no clubbing Skin:  No rashes no nodules Neuro:  CN II through XII intact, motor grossly intact  DEVICE  Normal device function.  See PaceArt for details.   Assess/Plan:

## 2015-06-07 NOTE — Assessment & Plan Note (Signed)
His symptoms are class 2. He will continue his current meds.  

## 2015-06-07 NOTE — Assessment & Plan Note (Signed)
His ventricular ectopy has resolved on amiodarone.

## 2015-06-30 ENCOUNTER — Other Ambulatory Visit: Payer: Self-pay | Admitting: Internal Medicine

## 2015-07-10 ENCOUNTER — Ambulatory Visit (INDEPENDENT_AMBULATORY_CARE_PROVIDER_SITE_OTHER): Payer: Medicare Other | Admitting: *Deleted

## 2015-07-10 DIAGNOSIS — Z95810 Presence of automatic (implantable) cardiac defibrillator: Secondary | ICD-10-CM

## 2015-07-11 NOTE — Progress Notes (Signed)
Remote ICD transmission.   

## 2015-07-13 LAB — CUP PACEART REMOTE DEVICE CHECK
Battery Voltage: 2.61 V
Brady Statistic AP VP Percent: 0.18 %
Brady Statistic AP VS Percent: 98.67 %
Brady Statistic RA Percent Paced: 98.86 %
Brady Statistic RV Percent Paced: 0.19 %
HIGH POWER IMPEDANCE MEASURED VALUE: 41 Ohm
HighPow Impedance: 50 Ohm
Lead Channel Impedance Value: 552 Ohm
Lead Channel Sensing Intrinsic Amplitude: 3.773 mV
Lead Channel Setting Pacing Amplitude: 2 V
Lead Channel Setting Pacing Pulse Width: 0.4 ms
Lead Channel Setting Sensing Sensitivity: 0.3 mV
MDC IDC MSMT LEADCHNL RA SENSING INTR AMPL: 1.183 mV
MDC IDC MSMT LEADCHNL RV IMPEDANCE VALUE: 400 Ohm
MDC IDC SESS DTM: 20160822131709
MDC IDC SET LEADCHNL RV PACING AMPLITUDE: 2.5 V
MDC IDC SET ZONE DETECTION INTERVAL: 340 ms
MDC IDC STAT BRADY AS VP PERCENT: 0 %
MDC IDC STAT BRADY AS VS PERCENT: 1.14 %
Zone Setting Detection Interval: 300 ms
Zone Setting Detection Interval: 350 ms
Zone Setting Detection Interval: 370 ms

## 2015-07-19 ENCOUNTER — Encounter: Payer: Self-pay | Admitting: Cardiology

## 2015-07-25 ENCOUNTER — Encounter: Payer: Self-pay | Admitting: Internal Medicine

## 2015-08-14 ENCOUNTER — Ambulatory Visit (INDEPENDENT_AMBULATORY_CARE_PROVIDER_SITE_OTHER): Payer: Medicare Other | Admitting: *Deleted

## 2015-08-14 DIAGNOSIS — Z95810 Presence of automatic (implantable) cardiac defibrillator: Secondary | ICD-10-CM

## 2015-08-15 LAB — CUP PACEART REMOTE DEVICE CHECK
Brady Statistic AP VS Percent: 96.98 %
Brady Statistic AS VP Percent: 0 %
Brady Statistic AS VS Percent: 2.73 %
Brady Statistic RA Percent Paced: 97.27 %
Brady Statistic RV Percent Paced: 0.29 %
HIGH POWER IMPEDANCE MEASURED VALUE: 41 Ohm
HighPow Impedance: 50 Ohm
Lead Channel Impedance Value: 392 Ohm
Lead Channel Impedance Value: 504 Ohm
Lead Channel Sensing Intrinsic Amplitude: 1.315 mV
Lead Channel Sensing Intrinsic Amplitude: 3.43 mV
Lead Channel Setting Pacing Amplitude: 2.5 V
Lead Channel Setting Pacing Pulse Width: 0.4 ms
MDC IDC MSMT BATTERY VOLTAGE: 2.61 V
MDC IDC SESS DTM: 20160926125650
MDC IDC SET LEADCHNL RA PACING AMPLITUDE: 2 V
MDC IDC SET LEADCHNL RV SENSING SENSITIVITY: 0.3 mV
MDC IDC SET ZONE DETECTION INTERVAL: 340 ms
MDC IDC STAT BRADY AP VP PERCENT: 0.29 %
Zone Setting Detection Interval: 300 ms
Zone Setting Detection Interval: 350 ms
Zone Setting Detection Interval: 370 ms

## 2015-08-15 NOTE — Progress Notes (Signed)
Remote ICD transmission.   

## 2015-08-18 ENCOUNTER — Encounter: Payer: Self-pay | Admitting: Cardiology

## 2015-09-04 ENCOUNTER — Encounter: Payer: Self-pay | Admitting: Internal Medicine

## 2015-09-11 ENCOUNTER — Ambulatory Visit (INDEPENDENT_AMBULATORY_CARE_PROVIDER_SITE_OTHER): Payer: Medicare Other | Admitting: *Deleted

## 2015-09-11 DIAGNOSIS — Z95810 Presence of automatic (implantable) cardiac defibrillator: Secondary | ICD-10-CM

## 2015-09-12 NOTE — Progress Notes (Signed)
Remote ICD transmission.   

## 2015-09-15 ENCOUNTER — Telehealth: Payer: Self-pay | Admitting: *Deleted

## 2015-09-15 LAB — CUP PACEART REMOTE DEVICE CHECK
Battery Voltage: 2.61 V
Brady Statistic AP VP Percent: 0.19 %
Brady Statistic RA Percent Paced: 97.23 %
Date Time Interrogation Session: 20161024125602
HIGH POWER IMPEDANCE MEASURED VALUE: 49 Ohm
HighPow Impedance: 40 Ohm
Implantable Lead Implant Date: 20090204
Implantable Lead Implant Date: 20090204
Implantable Lead Location: 753859
Implantable Lead Model: 5076
Lead Channel Impedance Value: 520 Ohm
Lead Channel Setting Pacing Amplitude: 2 V
Lead Channel Setting Pacing Amplitude: 2.5 V
Lead Channel Setting Pacing Pulse Width: 0.4 ms
MDC IDC LEAD LOCATION: 753860
MDC IDC MSMT LEADCHNL RA SENSING INTR AMPL: 1.183 mV
MDC IDC MSMT LEADCHNL RV IMPEDANCE VALUE: 400 Ohm
MDC IDC MSMT LEADCHNL RV SENSING INTR AMPL: 3.773 mV
MDC IDC SET LEADCHNL RV SENSING SENSITIVITY: 0.3 mV
MDC IDC STAT BRADY AP VS PERCENT: 97.03 %
MDC IDC STAT BRADY AS VP PERCENT: 0 %
MDC IDC STAT BRADY AS VS PERCENT: 2.77 %
MDC IDC STAT BRADY RV PERCENT PACED: 0.19 %

## 2015-09-15 NOTE — Telephone Encounter (Signed)
Informed patient that device reached ERI on 10/3. Patient voiced understanding. Patient to f/u w/Dr.Taylor first available to discuss gen change. Scheduling deferred to [facility].

## 2015-10-02 ENCOUNTER — Encounter: Payer: Self-pay | Admitting: Physician Assistant

## 2015-10-02 NOTE — Progress Notes (Signed)
Cardiology Office Note Date:  10/03/2015  Patient ID:  [name], DOB 07-15-1934, MRN [id number] PCP:  Manon Hilding, MD  Cardiologist/Electrophysiologist: Dr. Lovena Le    Chief Complaint:MDT device has reached ERI  History of Present Illness: [name] is a 81 y.o. male with history of non-ischemic CM, minimal CAD by cath 6144, chronic systolic CHF, PVCs on amiodarone.  He is feeling very well, hunting and doing his routine daily activities without difficulty.  He denies any exertional intolerances, no SOB or DOE, no symptoms of orthopnea.  No CP, palpitations, no shocks from his device.   Past Medical History  Diagnosis Date  . DCM (dilated cardiomyopathy) ([place])   . CAD (coronary artery disease)     minimal by cath  . CHF NYHA class II ([place])   . Automatic implantable cardiac defibrillator in situ     MDT, implanted 2009    Past Surgical History  Procedure Laterality Date  . S/p icd  2009  . Cardiac catheterization  12/17/07    Current Outpatient Prescriptions  Medication Sig Dispense Refill  . amiodarone (PACERONE) 200 MG tablet Take 1 tablet by mouth on days Monday-Friday and take 1/2 tablet by mouth on Saturday and Sunday    . furosemide (LASIX) 40 MG tablet Take 20 mg by mouth daily as needed (swelling). Dr. Quintin Alto (PCP) told him to take this only as needed    . levothyroxine (SYNTHROID, LEVOTHROID) 50 MCG tablet Take 50 mcg by mouth daily.      Marland Kitchen losartan (COZAAR) 25 MG tablet Take 25 mg by mouth daily.    . metoprolol tartrate (LOPRESSOR) 25 MG tablet Take 12.5 mg by mouth 2 (two) times daily.    . Tamsulosin HCl (FLOMAX) 0.4 MG CAPS Take 0.4 mg by mouth daily.     No current facility-administered medications for this visit.    Allergies:   Review of patient's allergies indicates no known allergies.   Social History:  The patient  reports that he has never smoked. He does not have any smokeless tobacco history on file. He reports that he does not drink  alcohol.   Family History:  The patient's family history includes Tuberculosis in his father.  ROS:  Please see the history of present illness.   All other systems are reviewed and otherwise negative.   PHYSICAL EXAM:  VS:  BP 152/84 mmHg  Pulse 64  Ht '6\' 3"'$  (1.905 m)  Wt 179 lb (81.194 kg)  BMI 22.37 kg/m2 BMI: Body mass index is 22.37 kg/(m^2). Well nourished, well developed, in no acute distress HEENT: normocephalic, atraumatic Neck: no JVD, carotid bruits or masses Cardiac:  normal S1, S2; RRR; no significant murmurs, no rubs, or gallops Lungs:  clear to auscultation bilaterally, no wheezing, rhonchi or rales Abd: soft, nontender,  + BS MS: no deformity or atrophy Ext: no edema Skin: warm and dry, no rash Neuro:  No gross deficits appreciated Psych: euthymic mood, full affect   ICD site is stable, no tethering or discomfort   EKG:  Done today shows atrial pacing, Vsensing, QTc 465m (prior 473)  Recent Labs: No recent labs found  Wt Readings from Last 3 Encounters:  10/03/15 179 lb (81.194 kg)  06/07/15 174 lb 9.6 oz (79.198 kg)  06/06/14 177 lb (80.287 kg)     Other studies reviewed: Additional studies/records reviewed today include prior records, device remote  DEVICE INFORMATION:  MDT Entrust implanted 12/23/07, Dr. TLovena Le ASSESSMENT AND PLAN:  1.  Device reached ERI 08/21/15 Plan for generator change Risks/benefits of the procedure d/w the patient and he would like to proceed Continue Care link remotes post op  2. Non-ischemic CM Appears very well compensated Rarely needs his PRN lasix On BB/ARB tx Schedule for echo doppler  3. BP is in the higher side today, recheck is 140/80 He reports his wife checks his BP at home, he is asked to check daily and notify if routinely >140/80  4. PVCs On Amiodarone, check labs  Disposition: F/u with wound check 10day post op and Dr. Lovena Le 50mo.  Current medicines are reviewed at length with the patient today.   The patient did not have any concerns regarding medicines.  SHaywood Lasso PA-C 10/03/2015 11:32 AM     [facility] [address] [place] [phone](office)  [phone](fax)

## 2015-10-03 ENCOUNTER — Ambulatory Visit (INDEPENDENT_AMBULATORY_CARE_PROVIDER_SITE_OTHER): Payer: Medicare Other | Admitting: Physician Assistant

## 2015-10-03 ENCOUNTER — Encounter: Payer: Self-pay | Admitting: Physician Assistant

## 2015-10-03 ENCOUNTER — Encounter: Payer: Self-pay | Admitting: *Deleted

## 2015-10-03 VITALS — BP 152/84 | HR 64 | Ht 75.0 in | Wt 179.0 lb

## 2015-10-03 DIAGNOSIS — I429 Cardiomyopathy, unspecified: Secondary | ICD-10-CM | POA: Diagnosis not present

## 2015-10-03 DIAGNOSIS — I493 Ventricular premature depolarization: Secondary | ICD-10-CM

## 2015-10-03 DIAGNOSIS — I5022 Chronic systolic (congestive) heart failure: Secondary | ICD-10-CM

## 2015-10-03 NOTE — Patient Instructions (Addendum)
Medication Instructions:   Your physician recommends that you continue on your current medications as directed. Please refer to the Current Medication list given to you today.     If you need a refill on your cardiac medications before your next appointment, please call your pharmacy.  Labwork:  BMET CBC RETURN  10/13/15  FOR  LABS   Testing/Procedures:    Your physician has requested that you have an echocardiogram. Echocardiography is a painless test that uses sound waves to create images of your heart. It provides your doctor with information about the size and shape of your heart and how well your heart's chambers and valves are working. This procedure takes approximately one hour. There are no restrictions for this procedure.  SEE LETTER FOR GENERATOR CHANGE OUT    Follow-Up:  14 DAY WOUND CHECK   11/02/15  91 DAYS ICD PHYS [facility]    12/19/15    Any Other Special Instructions Will Be Listed Below (If Applicable).

## 2015-10-11 DIAGNOSIS — N183 Chronic kidney disease, stage 3 (moderate): Secondary | ICD-10-CM | POA: Diagnosis not present

## 2015-10-11 DIAGNOSIS — E039 Hypothyroidism, unspecified: Secondary | ICD-10-CM | POA: Diagnosis not present

## 2015-10-11 DIAGNOSIS — N400 Enlarged prostate without lower urinary tract symptoms: Secondary | ICD-10-CM | POA: Diagnosis not present

## 2015-10-11 DIAGNOSIS — I10 Essential (primary) hypertension: Secondary | ICD-10-CM | POA: Diagnosis not present

## 2015-10-16 ENCOUNTER — Other Ambulatory Visit: Payer: Self-pay

## 2015-10-16 ENCOUNTER — Other Ambulatory Visit (INDEPENDENT_AMBULATORY_CARE_PROVIDER_SITE_OTHER): Payer: Medicare Other | Admitting: *Deleted

## 2015-10-16 ENCOUNTER — Ambulatory Visit (HOSPITAL_COMMUNITY): Payer: Medicare Other | Attending: Internal Medicine

## 2015-10-16 DIAGNOSIS — Z95810 Presence of automatic (implantable) cardiac defibrillator: Secondary | ICD-10-CM | POA: Insufficient documentation

## 2015-10-16 DIAGNOSIS — I5189 Other ill-defined heart diseases: Secondary | ICD-10-CM | POA: Diagnosis not present

## 2015-10-16 DIAGNOSIS — I517 Cardiomegaly: Secondary | ICD-10-CM | POA: Insufficient documentation

## 2015-10-16 DIAGNOSIS — I5022 Chronic systolic (congestive) heart failure: Secondary | ICD-10-CM | POA: Diagnosis not present

## 2015-10-16 DIAGNOSIS — I071 Rheumatic tricuspid insufficiency: Secondary | ICD-10-CM | POA: Diagnosis not present

## 2015-10-16 DIAGNOSIS — I340 Nonrheumatic mitral (valve) insufficiency: Secondary | ICD-10-CM | POA: Insufficient documentation

## 2015-10-16 DIAGNOSIS — I429 Cardiomyopathy, unspecified: Secondary | ICD-10-CM | POA: Insufficient documentation

## 2015-10-16 DIAGNOSIS — I351 Nonrheumatic aortic (valve) insufficiency: Secondary | ICD-10-CM | POA: Diagnosis not present

## 2015-10-16 DIAGNOSIS — I428 Other cardiomyopathies: Secondary | ICD-10-CM | POA: Diagnosis not present

## 2015-10-16 LAB — BASIC METABOLIC PANEL
BUN: 17 mg/dL (ref 7–25)
CALCIUM: 9.4 mg/dL (ref 8.6–10.3)
CO2: 29 mmol/L (ref 20–31)
Chloride: 101 mmol/L (ref 98–110)
Creat: 1.03 mg/dL (ref 0.70–1.18)
Glucose, Bld: 98 mg/dL (ref 65–99)
Potassium: 5.2 mmol/L (ref 3.5–5.3)
SODIUM: 137 mmol/L (ref 135–146)

## 2015-10-16 LAB — CBC WITH DIFFERENTIAL/PLATELET
BASOS PCT: 1 % (ref 0–1)
Basophils Absolute: 0.1 10*3/uL (ref 0.0–0.1)
Eosinophils Absolute: 0.3 10*3/uL (ref 0.0–0.7)
Eosinophils Relative: 4 % (ref 0–5)
HCT: 43.2 % (ref 39.0–52.0)
HEMOGLOBIN: 14.5 g/dL (ref 13.0–17.0)
Lymphocytes Relative: 41 % (ref 12–46)
Lymphs Abs: 2.7 10*3/uL (ref 0.7–4.0)
MCH: 30.1 pg (ref 26.0–34.0)
MCHC: 33.6 g/dL (ref 30.0–36.0)
MCV: 89.8 fL (ref 78.0–100.0)
MPV: 9.3 fL (ref 8.6–12.4)
Monocytes Absolute: 0.8 10*3/uL (ref 0.1–1.0)
Monocytes Relative: 12 % (ref 3–12)
NEUTROS ABS: 2.8 10*3/uL (ref 1.7–7.7)
NEUTROS PCT: 42 % — AB (ref 43–77)
Platelets: 289 10*3/uL (ref 150–400)
RBC: 4.81 MIL/uL (ref 4.22–5.81)
RDW: 13.1 % (ref 11.5–15.5)
WBC: 6.7 10*3/uL (ref 4.0–10.5)

## 2015-10-17 ENCOUNTER — Other Ambulatory Visit: Payer: Self-pay | Admitting: *Deleted

## 2015-10-17 ENCOUNTER — Telehealth: Payer: Self-pay | Admitting: *Deleted

## 2015-10-17 ENCOUNTER — Other Ambulatory Visit: Payer: Self-pay | Admitting: Physician Assistant

## 2015-10-17 DIAGNOSIS — Z79899 Other long term (current) drug therapy: Secondary | ICD-10-CM

## 2015-10-17 NOTE — Telephone Encounter (Signed)
-----   Message from [facility], [place] sent at 10/16/2015  5:49 PM EST ----- Please let the patient know his labs look OK, stable. Thank you.

## 2015-10-18 ENCOUNTER — Encounter (HOSPITAL_COMMUNITY)
Admission: RE | Disposition: A | Discharge status: Home / Self Care | Payer: Self-pay | Source: Ambulatory Visit | Attending: Internal Medicine

## 2015-10-18 ENCOUNTER — Ambulatory Visit (HOSPITAL_COMMUNITY)
Admission: RE | Admit: 2015-10-18 | Discharge: 2015-10-18 | Disposition: A | Discharge status: Home / Self Care | Payer: Medicare Other | Source: Ambulatory Visit | Attending: Internal Medicine | Admitting: Internal Medicine

## 2015-10-18 DIAGNOSIS — Z4502 Encounter for adjustment and management of automatic implantable cardiac defibrillator: Secondary | ICD-10-CM | POA: Diagnosis not present

## 2015-10-18 DIAGNOSIS — I2510 Atherosclerotic heart disease of native coronary artery without angina pectoris: Secondary | ICD-10-CM | POA: Insufficient documentation

## 2015-10-18 DIAGNOSIS — I472 Ventricular tachycardia: Secondary | ICD-10-CM

## 2015-10-18 DIAGNOSIS — Z4509 Encounter for adjustment and management of other cardiac device: Secondary | ICD-10-CM | POA: Diagnosis not present

## 2015-10-18 DIAGNOSIS — I5022 Chronic systolic (congestive) heart failure: Secondary | ICD-10-CM | POA: Diagnosis present

## 2015-10-18 DIAGNOSIS — I420 Dilated cardiomyopathy: Secondary | ICD-10-CM | POA: Diagnosis not present

## 2015-10-18 DIAGNOSIS — Z95810 Presence of automatic (implantable) cardiac defibrillator: Secondary | ICD-10-CM | POA: Diagnosis present

## 2015-10-18 HISTORY — PX: EP IMPLANTABLE DEVICE: SHX172B

## 2015-10-18 LAB — SURGICAL PCR SCREEN
MRSA, PCR: NEGATIVE
STAPHYLOCOCCUS AUREUS: POSITIVE — AB

## 2015-10-18 SURGERY — ICD/BIV ICD GENERATOR CHANGEOUT
Anesthesia: LOCAL

## 2015-10-18 MED ORDER — SODIUM CHLORIDE 0.9 % IV SOLN
INTRAVENOUS | Status: DC
  Administered 2015-10-18: 11:00:00 via INTRAVENOUS

## 2015-10-18 MED ORDER — LIDOCAINE HCL (PF) 1 % IJ SOLN
INTRAMUSCULAR | Status: AC
  Filled 2015-10-18: qty 60

## 2015-10-18 MED ORDER — GENTAMICIN SULFATE 40 MG/ML IJ SOLN
80.0000 mg | INTRAMUSCULAR | Status: AC
  Administered 2015-10-18: 80 mg
  Filled 2015-10-18: qty 2

## 2015-10-18 MED ORDER — MIDAZOLAM HCL 5 MG/5ML IJ SOLN
INTRAMUSCULAR | Status: AC
  Filled 2015-10-18: qty 5

## 2015-10-18 MED ORDER — CHLORHEXIDINE GLUCONATE 4 % EX LIQD
60.0000 mL | Freq: Once | CUTANEOUS | Status: DC
  Filled 2015-10-18: qty 60

## 2015-10-18 MED ORDER — FENTANYL CITRATE (PF) 100 MCG/2ML IJ SOLN
INTRAMUSCULAR | Status: AC
  Filled 2015-10-18: qty 2

## 2015-10-18 MED ORDER — CEFAZOLIN SODIUM-DEXTROSE 2-3 GM-% IV SOLR
2.0000 g | INTRAVENOUS | Status: AC
  Administered 2015-10-18: 2 g via INTRAVENOUS

## 2015-10-18 MED ORDER — MUPIROCIN 2 % EX OINT
1.0000 "application " | TOPICAL_OINTMENT | Freq: Once | CUTANEOUS | Status: AC
  Administered 2015-10-18: 1 via TOPICAL
  Filled 2015-10-18: qty 22

## 2015-10-18 MED ORDER — FENTANYL CITRATE (PF) 100 MCG/2ML IJ SOLN
INTRAMUSCULAR | Status: DC | PRN
  Administered 2015-10-18: 25 ug via INTRAVENOUS
  Administered 2015-10-18 (×2): 12.5 ug via INTRAVENOUS

## 2015-10-18 MED ORDER — LIDOCAINE HCL (PF) 1 % IJ SOLN
INTRAMUSCULAR | Status: DC | PRN
  Administered 2015-10-18: 14:00:00

## 2015-10-18 MED ORDER — MUPIROCIN 2 % EX OINT
TOPICAL_OINTMENT | CUTANEOUS | Status: AC
  Administered 2015-10-18: 1 via TOPICAL
  Filled 2015-10-18: qty 22

## 2015-10-18 MED ORDER — MIDAZOLAM HCL 5 MG/5ML IJ SOLN
INTRAMUSCULAR | Status: DC | PRN
  Administered 2015-10-18 (×4): 1 mg via INTRAVENOUS

## 2015-10-18 MED ORDER — CEFAZOLIN SODIUM-DEXTROSE 2-3 GM-% IV SOLR
INTRAVENOUS | Status: AC
  Filled 2015-10-18: qty 50

## 2015-10-18 SURGICAL SUPPLY — 4 items
CABLE SURGICAL S-101-97-12 (CABLE) ×2 IMPLANT
ICD EVERA XT DR DDBB1D1 (ICD Generator) ×2 IMPLANT
PAD DEFIB LIFELINK (PAD) ×2 IMPLANT
TRAY PACEMAKER INSERTION (PACKS) ×2 IMPLANT

## 2015-10-18 NOTE — H&P (Addendum)
Expand All Collapse All      Cardiology Office Note Date: 10/03/2015  Patient ID: [name], DOB 09/16/1934, MRN [id number] PCP: Manon Hilding, MD Cardiologist/Electrophysiologist: Dr. Lovena Le   Chief Complaint:MDT device has reached ERI  History of Present Illness: [name] is a 81 y.o. male with history of non-ischemic CM, minimal CAD by cath 4098, chronic systolic CHF, PVCs on amiodarone. He is feeling very well, hunting and doing his routine daily activities without difficulty. He denies any exertional intolerances, no SOB or DOE, no symptoms of orthopnea. No CP, palpitations, no shocks from his device.   Past Medical History  Diagnosis Date  . DCM (dilated cardiomyopathy) ([place])   . CAD (coronary artery disease)     minimal by cath  . CHF NYHA class II ([place])   . Automatic implantable cardiac defibrillator in situ     MDT, implanted 2009    Past Surgical History  Procedure Laterality Date  . S/p icd  2009  . Cardiac catheterization  12/17/07    Current Outpatient Prescriptions  Medication Sig Dispense Refill  . amiodarone (PACERONE) 200 MG tablet Take 1 tablet by mouth on days Monday-Friday and take 1/2 tablet by mouth on Saturday and Sunday    . furosemide (LASIX) 40 MG tablet Take 20 mg by mouth daily as needed (swelling). Dr. Quintin Alto (PCP) told him to take this only as needed    . levothyroxine (SYNTHROID, LEVOTHROID) 50 MCG tablet Take 50 mcg by mouth daily.     Marland Kitchen losartan (COZAAR) 25 MG tablet Take 25 mg by mouth daily.    . metoprolol tartrate (LOPRESSOR) 25 MG tablet Take 12.5 mg by mouth 2 (two) times daily.    . Tamsulosin HCl (FLOMAX) 0.4 MG CAPS Take 0.4 mg by mouth daily.     No current facility-administered medications for this visit.    Allergies: Review of patient's allergies indicates no known allergies.   Social History: The patient   reports that he has never smoked. He does not have any smokeless tobacco history on file. He reports that he does not drink alcohol.   Family History: The patient's family history includes Tuberculosis in his father.  ROS: Please see the history of present illness.  All other systems are reviewed and otherwise negative.   PHYSICAL EXAM:  VS: BP 152/84 mmHg  Pulse 64  Ht '6\' 3"'$  (1.905 m)  Wt 179 lb (81.194 kg)  BMI 22.37 kg/m2 BMI: Body mass index is 22.37 kg/(m^2). Well nourished, well developed, in no acute distress  HEENT: normocephalic, atraumatic  Neck: no JVD, carotid bruits or masses Cardiac: normal S1, S2; RRR; no significant murmurs, no rubs, or gallops Lungs: clear to auscultation bilaterally, no wheezing, rhonchi or rales  Abd: soft, nontender, + BS MS: no deformity or atrophy Ext: no edema  Skin: warm and dry, no rash Neuro: No gross deficits appreciated Psych: euthymic mood, full affect  ICD site is stable, no tethering or discomfort   EKG: Done today shows atrial pacing, Vsensing, QTc 483m (prior 473)  Recent Labs: No recent labs found  Wt Readings from Last 3 Encounters:  10/03/15 179 lb (81.194 kg)  06/07/15 174 lb 9.6 oz (79.198 kg)  06/06/14 177 lb (80.287 kg)     Other studies reviewed: Additional studies/records reviewed today include prior records, device remote  DEVICE INFORMATION: MDT Entrust implanted 12/23/07, Dr. TLovena Le ASSESSMENT AND PLAN:  1. Device reached ERI 08/21/15 Plan for generator change Risks/benefits of  the procedure d/w the patient and he would like to proceed Continue Care link remotes post op  2. Non-ischemic CM Appears very well compensated Rarely needs his PRN lasix On BB/ARB tx Schedule for echo doppler  3. BP is in the higher side today, recheck is 140/80 He reports his wife checks his BP at home, he is asked to check daily and notify if routinely >140/80  4. PVCs On Amiodarone, check  labs  Disposition: F/u with wound check 10day post op and Dr. Lovena Le 56mo.  Current medicines are reviewed at length with the patient today. The patient did not have any concerns regarding medicines.  SHaywood Lasso PA-C 10/03/2015 11:32 AM        EP Attending  Patient seen and examined. Agree with the findings as noted above. The patient has reached ERI on his ICD. He has a h/o symptomatic VT and is on amiodarone. His EF is improved by echo 2 days ago but previously was 25%. He is still at high risk for malignant ventricular arrhythmias. I have discussed the risks/benefits/goals/expectations of ICD generator change out and he wishes to proceed.   GMikle BosworthD.

## 2015-10-18 NOTE — Discharge Instructions (Signed)
Pacemaker Battery Change, Care After Refer to this sheet in the next few weeks. These instructions provide you with information on caring for yourself after your procedure. Your health care provider may also give you more specific instructions. Your treatment has been planned according to current medical practices, but problems sometimes occur. Call your health care provider if you have any problems or questions after your procedure. WHAT TO EXPECT AFTER THE PROCEDURE After your procedure, it is typical to have the following sensations:  Soreness at the pacemaker site. HOME CARE INSTRUCTIONS   Keep the incision clean and dry.  Unless advised otherwise, you may shower beginning 48 hours after your procedure.  For the first week after the replacement, avoid stretching motions that pull at the incision site, and avoid heavy exercise with the arm that is on the same side as the incision.  Take medicines only as directed by your health care provider.  Keep all follow-up visits as directed by your health care provider. SEEK MEDICAL CARE IF:   You have pain at the incision site that is not relieved by over-the-counter or prescription medicine.  There is drainage or pus from the incision site.  There is swelling larger than a lime at the incision site.  You develop red streaking that extends above or below the incision site.  You feel brief, intermittent palpitations, light-headedness, or any symptoms that you feel might be related to your heart. SEEK IMMEDIATE MEDICAL CARE IF:   You experience chest pain that is different than the pain at the pacemaker site.  You experience shortness of breath.  You have palpitations or irregular heartbeat.  You have light-headedness that does not go away quickly.  You faint.  You have pain that gets worse and is not relieved by medicine.   This information is not intended to replace advice given to you by your health care provider. Make sure you  discuss any questions you have with your health care provider.   Document Released: 08/25/2013 Document Revised: 11/25/2014 Document Reviewed: 08/25/2013 Elsevier Interactive Patient Education [employer].

## 2015-10-18 NOTE — Progress Notes (Signed)
Pts PCR is + for staph/ Pt and pts wife instructed verbally and with pamplet to complete mupiricin coarse of treatment.  Mupiricin given to pt to take home.  Pt and family verbalize understanding and deny further questions

## 2015-10-18 NOTE — H&P (Signed)
  ICD Criteria  Current LVEF:40%. Within 12 months prior to implant: Yes   Heart failure history: Yes, Class III  Cardiomyopathy history: Yes, Non-Ischemic Cardiomyopathy.  Atrial Fibrillation/Atrial Flutter: No.  Ventricular tachycardia history: Yes, Hemodynamic instability present. VT Type: Non-Sustained Ventricular Tachycardia.  Cardiac arrest history: No.  History of syndromes with risk of sudden death: No.  Previous ICD: Yes, Reason for ICD:  Primary prevention.  Current ICD indication: Secondary  PPM indication: No.   Class I or II Bradycardia indication present: No  Beta Blocker therapy for 3 or more months: Yes, prescribed.   Ace Inhibitor/ARB therapy for 3 or more months: Yes, prescribed.

## 2015-10-19 ENCOUNTER — Encounter (HOSPITAL_COMMUNITY): Payer: Self-pay | Admitting: Internal Medicine

## 2015-10-20 DIAGNOSIS — R2681 Unsteadiness on feet: Secondary | ICD-10-CM | POA: Diagnosis not present

## 2015-10-20 DIAGNOSIS — E039 Hypothyroidism, unspecified: Secondary | ICD-10-CM | POA: Diagnosis not present

## 2015-10-20 DIAGNOSIS — I482 Chronic atrial fibrillation: Secondary | ICD-10-CM | POA: Diagnosis not present

## 2015-10-20 DIAGNOSIS — N401 Enlarged prostate with lower urinary tract symptoms: Secondary | ICD-10-CM | POA: Diagnosis not present

## 2015-10-20 DIAGNOSIS — I10 Essential (primary) hypertension: Secondary | ICD-10-CM | POA: Diagnosis not present

## 2015-10-20 DIAGNOSIS — N183 Chronic kidney disease, stage 3 (moderate): Secondary | ICD-10-CM | POA: Diagnosis not present

## 2015-11-02 ENCOUNTER — Ambulatory Visit (INDEPENDENT_AMBULATORY_CARE_PROVIDER_SITE_OTHER): Payer: Medicare Other | Admitting: *Deleted

## 2015-11-02 ENCOUNTER — Other Ambulatory Visit (INDEPENDENT_AMBULATORY_CARE_PROVIDER_SITE_OTHER): Payer: Medicare Other | Admitting: *Deleted

## 2015-11-02 ENCOUNTER — Encounter: Payer: Self-pay | Admitting: Internal Medicine

## 2015-11-02 DIAGNOSIS — Z95810 Presence of automatic (implantable) cardiac defibrillator: Secondary | ICD-10-CM | POA: Diagnosis not present

## 2015-11-02 DIAGNOSIS — Z79899 Other long term (current) drug therapy: Secondary | ICD-10-CM | POA: Diagnosis not present

## 2015-11-02 DIAGNOSIS — I5022 Chronic systolic (congestive) heart failure: Secondary | ICD-10-CM

## 2015-11-02 LAB — CUP PACEART INCLINIC DEVICE CHECK
Battery Remaining Longevity: 116 mo
Brady Statistic RV Percent Paced: 0.06 %
HIGH POWER IMPEDANCE MEASURED VALUE: 46 Ohm
HighPow Impedance: 58 Ohm
Implantable Lead Implant Date: 20090204
Implantable Lead Location: 753860
Implantable Lead Model: 6947
Lead Channel Impedance Value: 342 Ohm
Lead Channel Impedance Value: 437 Ohm
Lead Channel Impedance Value: 456 Ohm
Lead Channel Pacing Threshold Amplitude: 0.75 V
Lead Channel Sensing Intrinsic Amplitude: 1.25 mV
MDC IDC LEAD IMPLANT DT: 20090204
MDC IDC LEAD LOCATION: 753859
MDC IDC MSMT BATTERY VOLTAGE: 3.15 V
MDC IDC MSMT LEADCHNL RA PACING THRESHOLD PULSEWIDTH: 0.4 ms
MDC IDC MSMT LEADCHNL RV PACING THRESHOLD AMPLITUDE: 1 V
MDC IDC MSMT LEADCHNL RV PACING THRESHOLD PULSEWIDTH: 0.4 ms
MDC IDC MSMT LEADCHNL RV SENSING INTR AMPL: 3.75 mV
MDC IDC SESS DTM: 20161215083759
MDC IDC SET LEADCHNL RA PACING AMPLITUDE: 2 V
MDC IDC SET LEADCHNL RV PACING AMPLITUDE: 2.5 V
MDC IDC SET LEADCHNL RV PACING PULSEWIDTH: 0.4 ms
MDC IDC SET LEADCHNL RV SENSING SENSITIVITY: 0.3 mV
MDC IDC STAT BRADY AP VP PERCENT: 0.06 %
MDC IDC STAT BRADY AP VS PERCENT: 98.85 %
MDC IDC STAT BRADY AS VP PERCENT: 0 %
MDC IDC STAT BRADY AS VS PERCENT: 1.09 %
MDC IDC STAT BRADY RA PERCENT PACED: 98.9 %

## 2015-11-02 LAB — HEPATIC FUNCTION PANEL
ALBUMIN: 4.1 g/dL (ref 3.6–5.1)
ALT: 18 U/L (ref 9–46)
AST: 21 U/L (ref 10–35)
Alkaline Phosphatase: 89 U/L (ref 40–115)
Bilirubin, Direct: 0.2 mg/dL (ref <=0.2)
Indirect Bilirubin: 0.6 mg/dL (ref 0.2–1.2)
TOTAL PROTEIN: 6.6 g/dL (ref 6.1–8.1)
Total Bilirubin: 0.8 mg/dL (ref 0.2–1.2)

## 2015-11-02 LAB — TSH: TSH: 5.268 u[IU]/mL — AB (ref 0.350–4.500)

## 2015-11-02 NOTE — Addendum Note (Signed)
Addended by: Eulis Foster on: 11/02/2015 07:36 AM   Modules accepted: Orders

## 2015-11-02 NOTE — Progress Notes (Signed)
Wound check appointment s/p generator replacement 10/18/15. Steri-strips removed. Wound without redness or edema. Incision edges approximated, wound well healed. Normal device function. Thresholds, sensing, and impedances consistent with implant measurements.  Histogram distribution appropriate for patient and level of activity. No mode switches or ventricular arrhythmias noted. Patient educated about wound care, arm mobility, lifting restrictions, shock plan. ROV with GT 12/19/15.

## 2015-11-06 ENCOUNTER — Telehealth: Payer: Self-pay | Admitting: *Deleted

## 2015-11-06 NOTE — Telephone Encounter (Signed)
-----   Message from [facility], [place] sent at 11/03/2015  8:11 AM EST ----- Please let the patient know his liver function/labs are normal, his TSH is high, to f/u with his PMD regarding his synthroid, please forward to the PMD office for evaluation as well.  Thanks [employer]

## 2015-11-06 NOTE — Telephone Encounter (Signed)
-----   Message from [facility], [place] sent at 11/03/2015  8:11 AM EST ----- Please let the patient know his liver function/labs are normal, his TSH is high, to f/u with his PMD regarding his synthroid, please forward to the PMD office for evaluation as well.  Thanks [employer]

## 2015-11-06 NOTE — Telephone Encounter (Signed)
-----   Message from [facility], [place] sent at 11/03/2015  8:11 AM EST ----- Please let the patient know his liver function/labs are normal, his TSH is high, to f/u with his PMD regarding his synthroid, please forward to the PMD office for evaluation as well.  Thanks [employer]

## 2015-11-14 ENCOUNTER — Encounter (HOSPITAL_COMMUNITY): Payer: Self-pay | Admitting: Internal Medicine

## 2015-12-19 ENCOUNTER — Ambulatory Visit (INDEPENDENT_AMBULATORY_CARE_PROVIDER_SITE_OTHER): Payer: Medicare Other | Admitting: Internal Medicine

## 2015-12-19 ENCOUNTER — Encounter: Payer: Self-pay | Admitting: Internal Medicine

## 2015-12-19 VITALS — BP 158/90 | HR 71 | Ht 75.0 in | Wt 178.8 lb

## 2015-12-19 DIAGNOSIS — I5022 Chronic systolic (congestive) heart failure: Secondary | ICD-10-CM

## 2015-12-19 DIAGNOSIS — Z95810 Presence of automatic (implantable) cardiac defibrillator: Secondary | ICD-10-CM

## 2015-12-19 DIAGNOSIS — I493 Ventricular premature depolarization: Secondary | ICD-10-CM | POA: Diagnosis not present

## 2015-12-19 LAB — CUP PACEART INCLINIC DEVICE CHECK
Brady Statistic AP VS Percent: 98.41 %
Brady Statistic AS VP Percent: 0 %
Brady Statistic RA Percent Paced: 98.47 %
HighPow Impedance: 49 Ohm
HighPow Impedance: 60 Ohm
Implantable Lead Implant Date: 20090204
Implantable Lead Location: 753859
Implantable Lead Model: 5076
Lead Channel Impedance Value: 399 Ohm
Lead Channel Pacing Threshold Amplitude: 0.75 V
Lead Channel Pacing Threshold Pulse Width: 0.4 ms
Lead Channel Setting Pacing Amplitude: 2 V
Lead Channel Setting Pacing Amplitude: 2.5 V
Lead Channel Setting Pacing Pulse Width: 0.4 ms
MDC IDC LEAD IMPLANT DT: 20090204
MDC IDC LEAD LOCATION: 753860
MDC IDC MSMT BATTERY REMAINING LONGEVITY: 113 mo
MDC IDC MSMT BATTERY VOLTAGE: 3.14 V
MDC IDC MSMT LEADCHNL RA PACING THRESHOLD AMPLITUDE: 0.75 V
MDC IDC MSMT LEADCHNL RA PACING THRESHOLD PULSEWIDTH: 0.4 ms
MDC IDC MSMT LEADCHNL RA SENSING INTR AMPL: 1.25 mV
MDC IDC MSMT LEADCHNL RV IMPEDANCE VALUE: 342 Ohm
MDC IDC MSMT LEADCHNL RV IMPEDANCE VALUE: 456 Ohm
MDC IDC MSMT LEADCHNL RV SENSING INTR AMPL: 5 mV
MDC IDC SESS DTM: 20170131093444
MDC IDC SET LEADCHNL RV SENSING SENSITIVITY: 0.3 mV
MDC IDC STAT BRADY AP VP PERCENT: 0.07 %
MDC IDC STAT BRADY AS VS PERCENT: 1.52 %
MDC IDC STAT BRADY RV PERCENT PACED: 0.07 %

## 2015-12-19 MED ORDER — AMIODARONE HCL 200 MG PO TABS: ORAL_TABLET | ORAL | Status: DC

## 2015-12-19 NOTE — Progress Notes (Signed)
HPI Mr. Belote returns today for followup. 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 In the interim he is much improved. His CHF symptoms are class 2. He denies palpitations. No other complaints. His dizziness has resolved. He is tending a garden and has been all summer. His wife notes that he is not sleeping well and he gets up 4 times at night to use the bathroom.  No Known Allergies   Current Outpatient Prescriptions  Medication Sig Dispense Refill  . amiodarone (PACERONE) 200 MG tablet Take 1 tablet by mouth on days Monday-Thurs and take 1/2 tablet by mouth Fri- Sunday    . furosemide (LASIX) 40 MG tablet Take 20 mg by mouth daily as needed (swelling). Dr. Quintin Alto (PCP) told him to take this only as needed    . levothyroxine (SYNTHROID, LEVOTHROID) 50 MCG tablet Take 50 mcg by mouth daily.      Marland Kitchen losartan (COZAAR) 25 MG tablet Take 25 mg by mouth daily.    . metoprolol tartrate (LOPRESSOR) 25 MG tablet Take 12.5 mg by mouth 2 (two) times daily.    Marland Kitchen omega-3 acid ethyl esters (LOVAZA) 1 G capsule Take 1 g by mouth 2 (two) times daily.    . Tamsulosin HCl (FLOMAX) 0.4 MG CAPS Take 0.4 mg by mouth daily.     No current facility-administered medications for this visit.     Past Medical History  Diagnosis Date  . DCM (dilated cardiomyopathy) ([place])   . CAD (coronary artery disease)     minimal by cath  . CHF NYHA class II ([place])   . Automatic implantable cardiac defibrillator in situ     MDT, implanted 2009    ROS:   All systems reviewed and negative except as noted in the HPI.   Past Surgical History  Procedure Laterality Date  . S/p icd  2009  . Cardiac catheterization  12/17/07  . Ep implantable device N/A 10/18/2015    Procedure: ICD Generator Changeout;  Surgeon: Evans Lance, MD;  Location: [facility];  Service: Cardiovascular;  Laterality: N/A;     Family History   Problem Relation Age of Onset  . Tuberculosis Father      Social History   Social History  . Marital Status: Married    Spouse Name: N/A  . Number of Children: N/A  . Years of Education: N/A   Occupational History  . Not on file.   Social History Main Topics  . Smoking status: Never Smoker   . Smokeless tobacco: Not on file  . Alcohol Use: No  . Drug Use: Not on file  . Sexual Activity: Not on file   Other Topics Concern  . Not on file   Social History Narrative   Retired.      BP 158/90 mmHg  Pulse 71  Ht '6\' 3"'$  (1.905 m)  Wt 178 lb 12.8 oz (81.103 kg)  BMI 22.35 kg/m2  Physical Exam:  Well appearing 82 yo man, NAD HEENT: Unremarkable Neck:  6 cm JVD, no thyromegally Lungs:  Clear with no wheezes, rales, or rhonchi HEART:  Regular rate rhythm, no murmurs, no rubs, no clicks Abd:  soft, positive bowel sounds, no organomegally, no rebound, no guarding Ext:  2 plus pulses, no edema, no cyanosis, no clubbing Skin:  No rashes no nodules Neuro:  CN II through XII intact, motor grossly intact  DEVICE  Normal device function.  See PaceArt for details.   Assess/Plan: 1. Non-ischemic CM/chronic systolic heart failure - his symptoms are well controlled. He will continue his current meds.  2. PVC's - These are much better on amio. Will reduce his dose 3. ICD - his new medtronic device is working normally. Will recheck in several months.   Mikle Bosworth.D.

## 2015-12-19 NOTE — Patient Instructions (Addendum)
Medication Instructions:  Your physician has recommended you make the following change in your medication:  1) Decrease Amiodarone to 1 tablet daily Mon-Thurs and 1/2 tablet daily Fri-Sun    Labwork: None ordered   Testing/Procedures: None ordered   Follow-Up: Your physician wants you to follow-up in: 9 months with Dr Knox Saliva will receive a reminder letter in the mail two months in advance. If you don't receive a letter, please call our office to schedule the follow-up appointment.  Remote monitoring is used to monitor your ICD from home. This monitoring reduces the number of office visits required to check your device to one time per year. It allows [place] to keep an eye on the functioning of your device to ensure it is working properly. You are scheduled for a device check from home on 03/19/16. You may send your transmission at any time that day. If you have a wireless device, the transmission will be sent automatically. After your physician reviews your transmission, you will receive a postcard with your next transmission date.     Any Other Special Instructions Will Be Listed Below (If Applicable).     If you need a refill on your cardiac medications before your next appointment, please call your pharmacy.

## 2016-03-19 ENCOUNTER — Ambulatory Visit (INDEPENDENT_AMBULATORY_CARE_PROVIDER_SITE_OTHER): Payer: Medicare Other | Admitting: *Deleted

## 2016-03-19 DIAGNOSIS — I429 Cardiomyopathy, unspecified: Secondary | ICD-10-CM | POA: Diagnosis not present

## 2016-03-19 DIAGNOSIS — Z95810 Presence of automatic (implantable) cardiac defibrillator: Secondary | ICD-10-CM

## 2016-03-19 NOTE — Progress Notes (Signed)
Remote ICD transmission.   

## 2016-04-19 DIAGNOSIS — I10 Essential (primary) hypertension: Secondary | ICD-10-CM | POA: Diagnosis not present

## 2016-04-19 DIAGNOSIS — E039 Hypothyroidism, unspecified: Secondary | ICD-10-CM | POA: Diagnosis not present

## 2016-04-19 DIAGNOSIS — N183 Chronic kidney disease, stage 3 (moderate): Secondary | ICD-10-CM | POA: Diagnosis not present

## 2016-04-26 ENCOUNTER — Encounter: Payer: Self-pay | Admitting: Cardiology

## 2016-04-26 DIAGNOSIS — N183 Chronic kidney disease, stage 3 (moderate): Secondary | ICD-10-CM | POA: Diagnosis not present

## 2016-04-26 DIAGNOSIS — E039 Hypothyroidism, unspecified: Secondary | ICD-10-CM | POA: Diagnosis not present

## 2016-04-26 DIAGNOSIS — I10 Essential (primary) hypertension: Secondary | ICD-10-CM | POA: Diagnosis not present

## 2016-04-26 DIAGNOSIS — N401 Enlarged prostate with lower urinary tract symptoms: Secondary | ICD-10-CM | POA: Diagnosis not present

## 2016-04-26 DIAGNOSIS — I482 Chronic atrial fibrillation: Secondary | ICD-10-CM | POA: Diagnosis not present

## 2016-04-29 DIAGNOSIS — R2681 Unsteadiness on feet: Secondary | ICD-10-CM | POA: Diagnosis not present

## 2016-04-29 DIAGNOSIS — R269 Unspecified abnormalities of gait and mobility: Secondary | ICD-10-CM | POA: Diagnosis not present

## 2016-04-29 LAB — CUP PACEART REMOTE DEVICE CHECK
Battery Remaining Longevity: 112 mo
Brady Statistic AP VP Percent: 0.09 %
Brady Statistic AP VS Percent: 99.19 %
Brady Statistic AS VS Percent: 0.72 %
Date Time Interrogation Session: 20170502083823
HighPow Impedance: 41 Ohm
HighPow Impedance: 46 Ohm
Implantable Lead Location: 753860
Implantable Lead Model: 6947
Lead Channel Impedance Value: 304 Ohm
Lead Channel Impedance Value: 380 Ohm
Lead Channel Pacing Threshold Amplitude: 0.75 V
Lead Channel Pacing Threshold Amplitude: 1 V
Lead Channel Pacing Threshold Pulse Width: 0.4 ms
Lead Channel Sensing Intrinsic Amplitude: 1.25 mV
Lead Channel Sensing Intrinsic Amplitude: 3.625 mV
Lead Channel Sensing Intrinsic Amplitude: 3.625 mV
Lead Channel Setting Sensing Sensitivity: 0.3 mV
MDC IDC LEAD IMPLANT DT: 20090204
MDC IDC LEAD IMPLANT DT: 20090204
MDC IDC LEAD LOCATION: 753859
MDC IDC MSMT BATTERY VOLTAGE: 3.08 V
MDC IDC MSMT LEADCHNL RA IMPEDANCE VALUE: 437 Ohm
MDC IDC MSMT LEADCHNL RA PACING THRESHOLD PULSEWIDTH: 0.4 ms
MDC IDC MSMT LEADCHNL RA SENSING INTR AMPL: 1.25 mV
MDC IDC SET LEADCHNL RA PACING AMPLITUDE: 2 V
MDC IDC SET LEADCHNL RV PACING AMPLITUDE: 2.5 V
MDC IDC SET LEADCHNL RV PACING PULSEWIDTH: 0.4 ms
MDC IDC STAT BRADY AS VP PERCENT: 0.01 %
MDC IDC STAT BRADY RA PERCENT PACED: 99.27 %
MDC IDC STAT BRADY RV PERCENT PACED: 0.09 %

## 2016-05-03 DIAGNOSIS — R269 Unspecified abnormalities of gait and mobility: Secondary | ICD-10-CM | POA: Diagnosis not present

## 2016-05-03 DIAGNOSIS — R2681 Unsteadiness on feet: Secondary | ICD-10-CM | POA: Diagnosis not present

## 2016-05-07 DIAGNOSIS — R269 Unspecified abnormalities of gait and mobility: Secondary | ICD-10-CM | POA: Diagnosis not present

## 2016-05-07 DIAGNOSIS — R2681 Unsteadiness on feet: Secondary | ICD-10-CM | POA: Diagnosis not present

## 2016-05-10 DIAGNOSIS — R269 Unspecified abnormalities of gait and mobility: Secondary | ICD-10-CM | POA: Diagnosis not present

## 2016-05-10 DIAGNOSIS — R2681 Unsteadiness on feet: Secondary | ICD-10-CM | POA: Diagnosis not present

## 2016-05-30 DIAGNOSIS — H11153 Pinguecula, bilateral: Secondary | ICD-10-CM | POA: Diagnosis not present

## 2016-05-30 DIAGNOSIS — H524 Presbyopia: Secondary | ICD-10-CM | POA: Diagnosis not present

## 2016-05-30 DIAGNOSIS — H26493 Other secondary cataract, bilateral: Secondary | ICD-10-CM | POA: Diagnosis not present

## 2016-05-30 DIAGNOSIS — H04123 Dry eye syndrome of bilateral lacrimal glands: Secondary | ICD-10-CM | POA: Diagnosis not present

## 2016-05-30 DIAGNOSIS — H40013 Open angle with borderline findings, low risk, bilateral: Secondary | ICD-10-CM | POA: Diagnosis not present

## 2016-06-12 DIAGNOSIS — H6121 Impacted cerumen, right ear: Secondary | ICD-10-CM | POA: Diagnosis not present

## 2016-06-18 ENCOUNTER — Ambulatory Visit (INDEPENDENT_AMBULATORY_CARE_PROVIDER_SITE_OTHER): Payer: Medicare Other | Admitting: *Deleted

## 2016-06-18 DIAGNOSIS — Z95810 Presence of automatic (implantable) cardiac defibrillator: Secondary | ICD-10-CM | POA: Diagnosis not present

## 2016-06-18 DIAGNOSIS — I429 Cardiomyopathy, unspecified: Secondary | ICD-10-CM

## 2016-06-18 NOTE — Progress Notes (Signed)
Remote ICD transmission.   

## 2016-06-25 ENCOUNTER — Encounter: Payer: Self-pay | Admitting: Cardiology

## 2016-07-02 LAB — CUP PACEART REMOTE DEVICE CHECK
Brady Statistic AP VP Percent: 0.07 %
Brady Statistic AS VP Percent: 0 %
Brady Statistic RA Percent Paced: 99.46 %
Brady Statistic RV Percent Paced: 0.07 %
Date Time Interrogation Session: 20170801124750
HIGH POWER IMPEDANCE MEASURED VALUE: 41 Ohm
HIGH POWER IMPEDANCE MEASURED VALUE: 49 Ohm
Implantable Lead Implant Date: 20090204
Implantable Lead Location: 753859
Implantable Lead Model: 5076
Implantable Lead Model: 6947
Lead Channel Impedance Value: 323 Ohm
Lead Channel Impedance Value: 437 Ohm
Lead Channel Pacing Threshold Amplitude: 0.75 V
Lead Channel Pacing Threshold Pulse Width: 0.4 ms
Lead Channel Sensing Intrinsic Amplitude: 1.75 mV
Lead Channel Sensing Intrinsic Amplitude: 4.375 mV
Lead Channel Setting Pacing Amplitude: 2 V
Lead Channel Setting Pacing Amplitude: 2.5 V
Lead Channel Setting Sensing Sensitivity: 0.3 mV
MDC IDC LEAD IMPLANT DT: 20090204
MDC IDC LEAD LOCATION: 753860
MDC IDC MSMT BATTERY REMAINING LONGEVITY: 110 mo
MDC IDC MSMT BATTERY VOLTAGE: 3.03 V
MDC IDC MSMT LEADCHNL RA PACING THRESHOLD AMPLITUDE: 1 V
MDC IDC MSMT LEADCHNL RA PACING THRESHOLD PULSEWIDTH: 0.4 ms
MDC IDC MSMT LEADCHNL RA SENSING INTR AMPL: 1.75 mV
MDC IDC MSMT LEADCHNL RV IMPEDANCE VALUE: 399 Ohm
MDC IDC MSMT LEADCHNL RV SENSING INTR AMPL: 4.375 mV
MDC IDC SET LEADCHNL RV PACING PULSEWIDTH: 0.4 ms
MDC IDC STAT BRADY AP VS PERCENT: 99.39 %
MDC IDC STAT BRADY AS VS PERCENT: 0.53 %

## 2016-09-05 ENCOUNTER — Other Ambulatory Visit: Payer: Self-pay | Admitting: Internal Medicine

## 2016-09-05 DIAGNOSIS — I493 Ventricular premature depolarization: Secondary | ICD-10-CM

## 2016-09-16 ENCOUNTER — Telehealth: Payer: Self-pay | Admitting: Internal Medicine

## 2016-09-16 ENCOUNTER — Encounter: Payer: Self-pay | Admitting: Internal Medicine

## 2016-09-16 ENCOUNTER — Ambulatory Visit (INDEPENDENT_AMBULATORY_CARE_PROVIDER_SITE_OTHER): Payer: Medicare Other | Admitting: Internal Medicine

## 2016-09-16 VITALS — BP 134/70 | HR 80 | Ht 75.0 in | Wt 181.8 lb

## 2016-09-16 DIAGNOSIS — I5022 Chronic systolic (congestive) heart failure: Secondary | ICD-10-CM | POA: Diagnosis not present

## 2016-09-16 DIAGNOSIS — Z95810 Presence of automatic (implantable) cardiac defibrillator: Secondary | ICD-10-CM

## 2016-09-16 DIAGNOSIS — I428 Other cardiomyopathies: Secondary | ICD-10-CM

## 2016-09-16 NOTE — Progress Notes (Signed)
Referred to ICM clinic by Levander Campion, device RN.  Met patient and wife in office.  Explained ICM program and he agreed to monthly follow up.  Scheduled 1st ICM remote transmission 10/17/2016.  Provided ICM direct number and explained transmission will be sent at night while sleeping.  He provided verbal permission to leave detailed message on answering machine and may speak with wife about PHI.

## 2016-09-16 NOTE — Patient Instructions (Addendum)
Medication Instructions:    Your physician recommends that you continue on your current medications as directed. Please refer to the Current Medication list given to you today.  --- If you need a refill on your cardiac medications before your next appointment, please call your pharmacy. ---  Labwork:  None ordered  Testing/Procedures:  None ordered  Follow-Up: Your ICM check is on 10/17/2016  Remote monitoring is used to monitor your Pacemaker of ICD from home. This monitoring reduces the number of office visits required to check your device to one time per year. It allows [place] to keep an eye on the functioning of your device to ensure it is working properly. You are scheduled for a device check from home on 12/16/2016. You may send your transmission at any time that day. If you have a wireless device, the transmission will be sent automatically. After your physician reviews your transmission, you will receive a postcard with your next transmission date.   Your physician wants you to follow-up in: 1 year with Dr. Lovena Le.  You will receive a reminder letter in the mail two months in advance. If you don't receive a letter, please call our office to schedule the follow-up appointment.  Thank you for choosing [facility]!!

## 2016-09-16 NOTE — Progress Notes (Signed)
HPI [name] returns today for followup. 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 In the interim he is much improved. His CHF symptoms are class 2. He denies palpitations. He recently cracked a rib when his crossbow slipped. He has had a couple of episodes where he almost passed out associated with diaphoresis and nausea.  No Known Allergies   Current Outpatient Prescriptions  Medication Sig Dispense Refill  . amiodarone (PACERONE) 200 MG tablet TAKE 1 TABLET MONDAY-FRIDAY AND TAKE 1/2 TABLET ON SATURDAY AND SUNDAY 30 tablet 3  . furosemide (LASIX) 40 MG tablet Take 20 mg by mouth daily as needed (swelling). Dr. Quintin Alto (PCP) told him to take this only as needed    . levothyroxine (SYNTHROID, LEVOTHROID) 50 MCG tablet Take 50 mcg by mouth daily.      Marland Kitchen losartan (COZAAR) 25 MG tablet Take 25 mg by mouth daily.    . metoprolol tartrate (LOPRESSOR) 25 MG tablet Take 25 mg by mouth 2 (two) times daily.     Marland Kitchen omega-3 acid ethyl esters (LOVAZA) 1 G capsule Take 1 g by mouth 2 (two) times daily.    . Tamsulosin HCl (FLOMAX) 0.4 MG CAPS Take 0.4 mg by mouth daily.     No current facility-administered medications for this visit.      Past Medical History:  Diagnosis Date  . Automatic implantable cardiac defibrillator in situ    MDT, implanted 2009  . CAD (coronary artery disease)    minimal by cath  . CHF NYHA class II ([place])   . DCM (dilated cardiomyopathy) ([place])     ROS:   All systems reviewed and negative except as noted in the HPI.   Past Surgical History:  Procedure Laterality Date  . CARDIAC CATHETERIZATION  12/17/07  . EP IMPLANTABLE DEVICE N/A 10/18/2015   Procedure: ICD Generator Changeout;  Surgeon: Evans Lance, MD;  Location: [facility];  Service: Cardiovascular;  Laterality: N/A;  . S/P ICD  2009     Family History  Problem Relation Age of Onset  . Tuberculosis  Father      Social History   Social History  . Marital status: Married    Spouse name: N/A  . Number of children: N/A  . Years of education: N/A   Occupational History  . Not on file.   Social History Main Topics  . Smoking status: Never Smoker  . Smokeless tobacco: Not on file  . Alcohol use No  . Drug use: Unknown  . Sexual activity: Not on file   Other Topics Concern  . Not on file   Social History Narrative   Retired.      BP 134/70   Pulse 80   Ht '6\' 3"'$  (1.905 m)   Wt 181 lb 12.8 oz (82.5 kg)   BMI 22.72 kg/m   Physical Exam:  Well appearing 82 yo man, NAD HEENT: Unremarkable Neck:  6 cm JVD, no thyromegally Lungs:  Clear with no wheezes, rales, or rhonchi HEART:  Regular rate rhythm, no murmurs, no rubs, no clicks Abd:  soft, positive bowel sounds, no organomegally, no rebound, no guarding Ext:  2 plus pulses, no edema, no cyanosis, no clubbing Skin:  No rashes no nodules Neuro:  CN II through XII intact, motor grossly intact  DEVICE  Normal device function.  See PaceArt for details.   Assess/Plan: 1. Non-ischemic CM/chronic  systolic heart failure - his symptoms are well controlled. He will continue his current meds.  2. PVC's - These are much better on amio. Will continue 200 mg daily Monday-Friday and 100 mg daily on Saturday and Sunday.  3. ICD - his new medtronic device is working normally. Will recheck in several months.  4. Autonomic dysfunction - he has had a couple of episodes. I reviewed the pathophysiology of his problem and discussed ways to avoid passing out.   [name].D.

## 2016-09-27 LAB — CUP PACEART INCLINIC DEVICE CHECK
Brady Statistic AP VP Percent: 0.08 %
Brady Statistic AP VS Percent: 99.36 %
Brady Statistic AS VP Percent: 0 %
Brady Statistic RV Percent Paced: 0.09 %
Date Time Interrogation Session: 20171030150959
HIGH POWER IMPEDANCE MEASURED VALUE: 44 Ohm
HIGH POWER IMPEDANCE MEASURED VALUE: 58 Ohm
Implantable Lead Implant Date: 20090204
Implantable Lead Location: 753859
Implantable Lead Location: 753860
Implantable Lead Model: 5076
Implantable Lead Model: 6947
Lead Channel Impedance Value: 342 Ohm
Lead Channel Pacing Threshold Amplitude: 0.75 V
Lead Channel Pacing Threshold Pulse Width: 0.4 ms
Lead Channel Setting Pacing Amplitude: 2 V
Lead Channel Setting Pacing Pulse Width: 0.4 ms
MDC IDC LEAD IMPLANT DT: 20090204
MDC IDC MSMT BATTERY REMAINING LONGEVITY: 107 mo
MDC IDC MSMT BATTERY VOLTAGE: 3.01 V
MDC IDC MSMT LEADCHNL RA IMPEDANCE VALUE: 437 Ohm
MDC IDC MSMT LEADCHNL RA PACING THRESHOLD AMPLITUDE: 0.75 V
MDC IDC MSMT LEADCHNL RA PACING THRESHOLD PULSEWIDTH: 0.4 ms
MDC IDC MSMT LEADCHNL RA SENSING INTR AMPL: 1.5 mV
MDC IDC MSMT LEADCHNL RV IMPEDANCE VALUE: 437 Ohm
MDC IDC MSMT LEADCHNL RV SENSING INTR AMPL: 4.5 mV
MDC IDC PG IMPLANT DT: 20161130
MDC IDC SET LEADCHNL RV PACING AMPLITUDE: 2.5 V
MDC IDC SET LEADCHNL RV SENSING SENSITIVITY: 0.3 mV
MDC IDC STAT BRADY AS VS PERCENT: 0.56 %
MDC IDC STAT BRADY RA PERCENT PACED: 99.41 %

## 2016-09-27 NOTE — Telephone Encounter (Signed)
error 

## 2016-10-17 ENCOUNTER — Ambulatory Visit (INDEPENDENT_AMBULATORY_CARE_PROVIDER_SITE_OTHER): Payer: Medicare Other

## 2016-10-17 DIAGNOSIS — I5022 Chronic systolic (congestive) heart failure: Secondary | ICD-10-CM | POA: Diagnosis not present

## 2016-10-17 DIAGNOSIS — Z95810 Presence of automatic (implantable) cardiac defibrillator: Secondary | ICD-10-CM

## 2016-10-17 NOTE — Progress Notes (Signed)
EPIC Encounter for ICM Monitoring  Patient Name: [name] is a 82 y.o. male Date: 10/17/2016 Primary Care Physican: Manon Hilding, MD Primary Cardiologist: Lovena Le Electrophysiologist: Lovena Le Dry Weight:  unknown - does not weigh        1st ICM encounter. Heart Failure questions reviewed, pt asymptomatic   Thoracic impedance normal   Recommendations: No changes.  Reinforced low salt food choices and limiting fluid intake to < 2 liters per day. Encouraged to call for fluid symptoms.    Follow-up plan: ICM clinic phone appointment on 11/21/2016.  Copy of ICM check sent to device physician.   ICM trend: 10/17/2016       Rosalene Billings, RN 10/17/2016 5:40 PM

## 2016-10-22 DIAGNOSIS — I482 Chronic atrial fibrillation: Secondary | ICD-10-CM | POA: Diagnosis not present

## 2016-10-22 DIAGNOSIS — N183 Chronic kidney disease, stage 3 (moderate): Secondary | ICD-10-CM | POA: Diagnosis not present

## 2016-10-22 DIAGNOSIS — I10 Essential (primary) hypertension: Secondary | ICD-10-CM | POA: Diagnosis not present

## 2016-10-25 DIAGNOSIS — I482 Chronic atrial fibrillation: Secondary | ICD-10-CM | POA: Diagnosis not present

## 2016-10-25 DIAGNOSIS — I10 Essential (primary) hypertension: Secondary | ICD-10-CM | POA: Diagnosis not present

## 2016-10-25 DIAGNOSIS — N183 Chronic kidney disease, stage 3 (moderate): Secondary | ICD-10-CM | POA: Diagnosis not present

## 2016-10-25 DIAGNOSIS — N401 Enlarged prostate with lower urinary tract symptoms: Secondary | ICD-10-CM | POA: Diagnosis not present

## 2016-10-25 DIAGNOSIS — E039 Hypothyroidism, unspecified: Secondary | ICD-10-CM | POA: Diagnosis not present

## 2016-11-21 ENCOUNTER — Ambulatory Visit (INDEPENDENT_AMBULATORY_CARE_PROVIDER_SITE_OTHER): Payer: Medicare Other

## 2016-11-21 DIAGNOSIS — I5022 Chronic systolic (congestive) heart failure: Secondary | ICD-10-CM

## 2016-11-21 DIAGNOSIS — Z95810 Presence of automatic (implantable) cardiac defibrillator: Secondary | ICD-10-CM

## 2016-11-22 NOTE — Progress Notes (Signed)
EPIC Encounter for ICM Monitoring  Patient Name: [name] is a 83 y.o. male Date: 11/22/2016 Primary Care Physican: Manon Hilding, MD Primary Cardiologist: Lovena Le Electrophysiologist: Lovena Le Dry Weight:     unknown - does not weigh  Heart Failure questions reviewed, pt asymptomatic   Thoracic impedance normal   Recommendations: No changes.  Reinforced to limit low salt food choices to 2000 mg day and limiting fluid intake to < 2 liters per day. Encouraged to call for fluid symptoms.    Follow-up plan: ICM clinic phone appointment on 12/23/2016.  Copy of ICM check sent to device physician.   3 month ICM trend : 11/21/2016   1 Year ICM trend:      Rosalene Billings, RN 11/22/2016 9:57 AM

## 2016-12-23 ENCOUNTER — Telehealth: Payer: Self-pay | Admitting: Cardiology

## 2016-12-23 ENCOUNTER — Ambulatory Visit (INDEPENDENT_AMBULATORY_CARE_PROVIDER_SITE_OTHER): Payer: Medicare Other | Admitting: *Deleted

## 2016-12-23 DIAGNOSIS — I5022 Chronic systolic (congestive) heart failure: Secondary | ICD-10-CM

## 2016-12-23 DIAGNOSIS — Z95810 Presence of automatic (implantable) cardiac defibrillator: Secondary | ICD-10-CM | POA: Diagnosis not present

## 2016-12-23 DIAGNOSIS — I428 Other cardiomyopathies: Secondary | ICD-10-CM

## 2016-12-23 NOTE — Telephone Encounter (Signed)
Spoke with pt and reminded pt of remote transmission that is due today. Pt verbalized understanding.   

## 2016-12-23 NOTE — Progress Notes (Signed)
EPIC Encounter for ICM Monitoring  Patient Name: [name] is a 83 y.o. male Date: 12/23/2016 Primary Care Physican: Manon Hilding, MD Primary Cardiologist:Taylor Electrophysiologist: Druscilla Brownie Weight:unknown - does not weigh      Heart Failure questions reviewed, pt asymptomatic.  He has had cold the last week but starting to feel better.   Thoracic impedance normal   Recommendations: No changes. Reminded to limit dietary salt intake to 2000 mg/day and fluid intake to < 2 liters/day. Encouraged to call for fluid symptoms.  Follow-up plan: ICM clinic phone appointment on 01/23/2017.  Copy of ICM check sent to device physician.   3 month ICM trend: 12/23/2016   1 Year ICM trend:      Rosalene Billings, RN 12/23/2016 1:30 PM

## 2016-12-23 NOTE — Progress Notes (Signed)
Remote ICD transmission.   

## 2016-12-24 LAB — CUP PACEART REMOTE DEVICE CHECK
Brady Statistic AP VP Percent: 0.06 %
Brady Statistic AP VS Percent: 99.6 %
Brady Statistic AS VP Percent: 0 %
Brady Statistic AS VS Percent: 0.35 %
Brady Statistic RV Percent Paced: 0.06 %
HIGH POWER IMPEDANCE MEASURED VALUE: 61 Ohm
HighPow Impedance: 48 Ohm
Implantable Lead Implant Date: 20090204
Implantable Lead Model: 6947
Lead Channel Impedance Value: 456 Ohm
Lead Channel Pacing Threshold Pulse Width: 0.4 ms
Lead Channel Sensing Intrinsic Amplitude: 1.375 mV
Lead Channel Sensing Intrinsic Amplitude: 3.875 mV
Lead Channel Sensing Intrinsic Amplitude: 3.875 mV
Lead Channel Setting Pacing Amplitude: 2 V
Lead Channel Setting Pacing Amplitude: 2.5 V
Lead Channel Setting Pacing Pulse Width: 0.4 ms
MDC IDC LEAD IMPLANT DT: 20090204
MDC IDC LEAD LOCATION: 753859
MDC IDC LEAD LOCATION: 753860
MDC IDC MSMT BATTERY REMAINING LONGEVITY: 105 mo
MDC IDC MSMT BATTERY VOLTAGE: 3.01 V
MDC IDC MSMT LEADCHNL RA PACING THRESHOLD AMPLITUDE: 1 V
MDC IDC MSMT LEADCHNL RA PACING THRESHOLD PULSEWIDTH: 0.4 ms
MDC IDC MSMT LEADCHNL RA SENSING INTR AMPL: 1.375 mV
MDC IDC MSMT LEADCHNL RV IMPEDANCE VALUE: 342 Ohm
MDC IDC MSMT LEADCHNL RV IMPEDANCE VALUE: 456 Ohm
MDC IDC MSMT LEADCHNL RV PACING THRESHOLD AMPLITUDE: 0.75 V
MDC IDC PG IMPLANT DT: 20161130
MDC IDC SESS DTM: 20180205051704
MDC IDC SET LEADCHNL RV SENSING SENSITIVITY: 0.3 mV
MDC IDC STAT BRADY RA PERCENT PACED: 99.64 %

## 2016-12-25 ENCOUNTER — Encounter: Payer: Self-pay | Admitting: Cardiology

## 2017-01-20 ENCOUNTER — Other Ambulatory Visit: Payer: Self-pay | Admitting: Internal Medicine

## 2017-01-20 DIAGNOSIS — I493 Ventricular premature depolarization: Secondary | ICD-10-CM

## 2017-01-20 MED ORDER — AMIODARONE HCL 200 MG PO TABS: ORAL_TABLET | ORAL | 6 refills | Status: DC

## 2017-01-23 ENCOUNTER — Ambulatory Visit (INDEPENDENT_AMBULATORY_CARE_PROVIDER_SITE_OTHER): Payer: Medicare Other

## 2017-01-23 DIAGNOSIS — Z95810 Presence of automatic (implantable) cardiac defibrillator: Secondary | ICD-10-CM

## 2017-01-23 DIAGNOSIS — I5022 Chronic systolic (congestive) heart failure: Secondary | ICD-10-CM | POA: Diagnosis not present

## 2017-01-23 NOTE — Progress Notes (Signed)
EPIC Encounter for ICM Monitoring  Patient Name: [name] is a 83 y.o. male Date: 01/23/2017 Primary Care Physican: Manon Hilding, MD Primary Cardiologist:Taylor Electrophysiologist: Druscilla Brownie Weight:unknown - does not weigh  Spoke with wife. Heart Failure questions reviewed, pt asymptomatic.   Thoracic impedance normal.  Prescribed dosage: Furosemide 40 mg 1 tablet as needed  Recommendations: No changes. Encouraged to call for fluid symptoms.  Follow-up plan: ICM clinic phone appointment on 02/25/2017.  Copy of ICM check sent to device physician.   3 month ICM trend: 01/23/2017   1 Year ICM trend:      Rosalene Billings, RN 01/23/2017 1:14 PM

## 2017-02-25 ENCOUNTER — Ambulatory Visit (INDEPENDENT_AMBULATORY_CARE_PROVIDER_SITE_OTHER): Payer: Medicare Other

## 2017-02-25 ENCOUNTER — Telehealth: Payer: Self-pay | Admitting: Cardiology

## 2017-02-25 DIAGNOSIS — I5022 Chronic systolic (congestive) heart failure: Secondary | ICD-10-CM

## 2017-02-25 DIAGNOSIS — Z95810 Presence of automatic (implantable) cardiac defibrillator: Secondary | ICD-10-CM | POA: Diagnosis not present

## 2017-02-25 NOTE — Progress Notes (Signed)
EPIC Encounter for ICM Monitoring  Patient Name: [name] is a 83 y.o. male Date: 02/25/2017 Primary Care Physican: Manon Hilding, MD Primary Cardiologist:Taylor Electrophysiologist: Druscilla Brownie Weight:unknown - does not weigh      Spoke with wife. Heart Failure questions reviewed, pt asymptomatic.   Thoracic impedance abnormal suggesting fluid accumulation.  Prescribed dosage: Furosemide 40 mg 1 tablet as needed  Recommendations: Advised to take prn Furosemide 40 mg 1 tablet daily x 2 days and then return to prn  Follow-up plan: ICM clinic phone appointment on 03/11/2017.    Copy of ICM check sent to device physician.   3 month ICM trend: 02/25/2017   1 Year ICM trend:      Rosalene Billings, RN 02/25/2017 11:32 AM

## 2017-02-25 NOTE — Telephone Encounter (Signed)
Spoke with pt and reminded pt of remote transmission that is due today. Pt verbalized understanding.   

## 2017-03-11 ENCOUNTER — Ambulatory Visit (INDEPENDENT_AMBULATORY_CARE_PROVIDER_SITE_OTHER): Payer: Self-pay

## 2017-03-11 DIAGNOSIS — I5022 Chronic systolic (congestive) heart failure: Secondary | ICD-10-CM

## 2017-03-11 DIAGNOSIS — Z95810 Presence of automatic (implantable) cardiac defibrillator: Secondary | ICD-10-CM

## 2017-03-12 ENCOUNTER — Telehealth: Payer: Self-pay | Admitting: Cardiology

## 2017-03-12 NOTE — Telephone Encounter (Signed)
Spoke with pt and reminded pt of remote transmission that is due today. Pt verbalized understanding.   

## 2017-03-13 ENCOUNTER — Telehealth: Payer: Self-pay

## 2017-03-13 NOTE — Telephone Encounter (Signed)
Remote ICM transmission received.  Attempted patient call and no answer or answering machine.

## 2017-03-13 NOTE — Progress Notes (Signed)
EPIC Encounter for ICM Monitoring  Patient Name: RAMSAY BOGNAR is a 83 y.o. male Date: 03/13/2017 Primary Care Physican: Manon Hilding, MD Primary Cardiologist:Taylor Electrophysiologist: Druscilla Brownie Weight:unknown - does not weigh      Attempted call to patient and unable to reach.    Transmission reviewed.    Thoracic impedance returned to baseline after taking prn Furosemide x 2 days on 02/25/2017. Impedance became abnormal again since 4/14 suggesting fluid accumulation reoccurred.  Prescribed dosage: Furosemide 40 mg 1 tablet as needed  Recommendations: NONE - Unable to reach patient   Follow-up plan: ICM clinic phone appointment on 03/20/2017 to recheck fluid levels.    Copy of ICM check sent to device physician.   3 month ICM trend: 03/13/2017   1 Year ICM trend:      Rosalene Billings, RN 03/13/2017 3:21 PM

## 2017-03-20 ENCOUNTER — Telehealth: Payer: Self-pay | Admitting: Cardiology

## 2017-03-20 NOTE — Telephone Encounter (Signed)
Attempted to confirm remote transmission with pt. No answer and was unable to leave a message.   

## 2017-03-21 NOTE — Progress Notes (Signed)
No ICM remote transmission received for 03/20/2017 and next ICM transmission scheduled for 03/31/2017.

## 2017-03-31 ENCOUNTER — Other Ambulatory Visit: Payer: Self-pay | Admitting: Internal Medicine

## 2017-03-31 ENCOUNTER — Encounter: Payer: Medicare Other | Admitting: *Deleted

## 2017-03-31 ENCOUNTER — Telehealth: Payer: Self-pay | Admitting: Cardiology

## 2017-03-31 DIAGNOSIS — I493 Ventricular premature depolarization: Secondary | ICD-10-CM

## 2017-03-31 NOTE — Telephone Encounter (Signed)
Spoke with pt and reminded pt of remote transmission that is due today. Pt verbalized understanding.   

## 2017-04-03 ENCOUNTER — Encounter: Payer: Self-pay | Admitting: Cardiology

## 2017-04-04 NOTE — Progress Notes (Signed)
No ICM remote transmission received for 03/31/2017 and next ICM transmission scheduled for 04/16/2017.

## 2017-04-17 NOTE — Progress Notes (Signed)
No ICM remote transmission received for 04/16/2017 and next ICM transmission scheduled for 05/08/2017.

## 2017-04-21 DIAGNOSIS — E039 Hypothyroidism, unspecified: Secondary | ICD-10-CM | POA: Diagnosis not present

## 2017-04-21 DIAGNOSIS — N183 Chronic kidney disease, stage 3 (moderate): Secondary | ICD-10-CM | POA: Diagnosis not present

## 2017-04-21 DIAGNOSIS — I10 Essential (primary) hypertension: Secondary | ICD-10-CM | POA: Diagnosis not present

## 2017-04-23 DIAGNOSIS — I10 Essential (primary) hypertension: Secondary | ICD-10-CM | POA: Diagnosis not present

## 2017-04-23 DIAGNOSIS — Z6823 Body mass index (BMI) 23.0-23.9, adult: Secondary | ICD-10-CM | POA: Diagnosis not present

## 2017-04-23 DIAGNOSIS — I482 Chronic atrial fibrillation: Secondary | ICD-10-CM | POA: Diagnosis not present

## 2017-04-23 DIAGNOSIS — R61 Generalized hyperhidrosis: Secondary | ICD-10-CM | POA: Diagnosis not present

## 2017-04-23 DIAGNOSIS — N183 Chronic kidney disease, stage 3 (moderate): Secondary | ICD-10-CM | POA: Diagnosis not present

## 2017-04-23 DIAGNOSIS — E039 Hypothyroidism, unspecified: Secondary | ICD-10-CM | POA: Diagnosis not present

## 2017-04-23 DIAGNOSIS — N401 Enlarged prostate with lower urinary tract symptoms: Secondary | ICD-10-CM | POA: Diagnosis not present

## 2017-05-08 ENCOUNTER — Telehealth: Payer: Self-pay | Admitting: Cardiology

## 2017-05-08 ENCOUNTER — Encounter: Payer: Medicare Other | Admitting: *Deleted

## 2017-05-08 NOTE — Telephone Encounter (Signed)
Attempted to confirm remote transmission with pt. No answer and was unable to leave a message.   

## 2017-05-09 ENCOUNTER — Encounter: Payer: Self-pay | Admitting: Cardiology

## 2017-05-09 NOTE — Progress Notes (Signed)
No ICM remote transmission received for 05/08/2017 and next ICM transmission scheduled for 06/03/2017.

## 2017-06-03 ENCOUNTER — Ambulatory Visit (INDEPENDENT_AMBULATORY_CARE_PROVIDER_SITE_OTHER): Payer: Medicare Other | Admitting: *Deleted

## 2017-06-03 DIAGNOSIS — Z95810 Presence of automatic (implantable) cardiac defibrillator: Secondary | ICD-10-CM | POA: Diagnosis not present

## 2017-06-03 DIAGNOSIS — I5022 Chronic systolic (congestive) heart failure: Secondary | ICD-10-CM | POA: Diagnosis not present

## 2017-06-03 DIAGNOSIS — I428 Other cardiomyopathies: Secondary | ICD-10-CM | POA: Diagnosis not present

## 2017-06-03 NOTE — Progress Notes (Signed)
ICD Remote transmission received

## 2017-06-03 NOTE — Progress Notes (Signed)
EPIC Encounter for ICM Monitoring  Patient Name: [name] is a 83 y.o. male Date: 06/03/2017 Primary Care Physican: Manon Hilding, MD Primary Cardiologist:Taylor Electrophysiologist: Druscilla Brownie Weight:unknown - does not weigh        Spoke with wife. Heart Failure questions reviewed, pt asymptomatic .   Thoracic impedance normal.  Prescribed dosage: Furosemide 20 mg 1 tablet as needed  Recommendations: No changes.   Encouraged to call for fluid symptoms.  Follow-up plan: ICM clinic phone appointment on 07/04/2017.    Copy of ICM check sent to primary cardiologist and device physician.   3 month ICM trend: 06/03/2017   1 Year ICM trend:      Rosalene Billings, RN 06/03/2017 1:24 PM

## 2017-06-04 ENCOUNTER — Encounter: Payer: Self-pay | Admitting: Cardiology

## 2017-06-05 DIAGNOSIS — H04123 Dry eye syndrome of bilateral lacrimal glands: Secondary | ICD-10-CM | POA: Diagnosis not present

## 2017-06-05 DIAGNOSIS — H524 Presbyopia: Secondary | ICD-10-CM | POA: Diagnosis not present

## 2017-06-05 DIAGNOSIS — H26491 Other secondary cataract, right eye: Secondary | ICD-10-CM | POA: Diagnosis not present

## 2017-06-05 DIAGNOSIS — H40013 Open angle with borderline findings, low risk, bilateral: Secondary | ICD-10-CM | POA: Diagnosis not present

## 2017-06-05 LAB — CUP PACEART REMOTE DEVICE CHECK
Battery Remaining Longevity: 99 mo
Battery Voltage: 3 V
Brady Statistic AP VP Percent: 0.17 %
Brady Statistic AS VS Percent: 0.82 %
Brady Statistic RA Percent Paced: 99.15 %
Brady Statistic RV Percent Paced: 0.2 %
HIGH POWER IMPEDANCE MEASURED VALUE: 54 Ohm
HighPow Impedance: 43 Ohm
Implantable Lead Implant Date: 20090204
Implantable Lead Location: 753860
Implantable Lead Model: 5076
Implantable Lead Model: 6947
Implantable Pulse Generator Implant Date: 20161130
Lead Channel Impedance Value: 323 Ohm
Lead Channel Impedance Value: 380 Ohm
Lead Channel Pacing Threshold Pulse Width: 0.4 ms
Lead Channel Sensing Intrinsic Amplitude: 1.25 mV
Lead Channel Sensing Intrinsic Amplitude: 3.75 mV
Lead Channel Sensing Intrinsic Amplitude: 3.75 mV
Lead Channel Setting Pacing Amplitude: 2 V
Lead Channel Setting Pacing Pulse Width: 0.4 ms
Lead Channel Setting Sensing Sensitivity: 0.3 mV
MDC IDC LEAD IMPLANT DT: 20090204
MDC IDC LEAD LOCATION: 753859
MDC IDC MSMT LEADCHNL RA IMPEDANCE VALUE: 456 Ohm
MDC IDC MSMT LEADCHNL RA PACING THRESHOLD AMPLITUDE: 0.875 V
MDC IDC MSMT LEADCHNL RA SENSING INTR AMPL: 1.25 mV
MDC IDC MSMT LEADCHNL RV PACING THRESHOLD AMPLITUDE: 0.875 V
MDC IDC MSMT LEADCHNL RV PACING THRESHOLD PULSEWIDTH: 0.4 ms
MDC IDC SESS DTM: 20180717041804
MDC IDC SET LEADCHNL RV PACING AMPLITUDE: 2.5 V
MDC IDC STAT BRADY AP VS PERCENT: 99.01 %
MDC IDC STAT BRADY AS VP PERCENT: 0 %

## 2017-07-04 ENCOUNTER — Ambulatory Visit (INDEPENDENT_AMBULATORY_CARE_PROVIDER_SITE_OTHER): Payer: Medicare Other

## 2017-07-04 DIAGNOSIS — I5022 Chronic systolic (congestive) heart failure: Secondary | ICD-10-CM

## 2017-07-04 DIAGNOSIS — Z95810 Presence of automatic (implantable) cardiac defibrillator: Secondary | ICD-10-CM

## 2017-07-04 NOTE — Progress Notes (Signed)
EPIC Encounter for ICM Monitoring  Patient Name: [name] is a 83 y.o. male Date: 07/04/2017 Primary Care Physican: Manon Hilding, MD Primary Cardiologist:Taylor Electrophysiologist: Druscilla Brownie Weight:unknown - does not weigh                                          Spoke with wife.  Heart Failure questions reviewed, pt asymptomatic.  Patient occasionally takes Furosemide PRN.     Thoracic impedance normal.  Prescribed dosage: Furosemide 20 mg 1 tablet as needed  Recommendations: No changes.  Advised to limit salt intake to 2000 mg/day and fluid intake to < 2 liters/day.  Encouraged to call for fluid symptoms.   Follow-up plan: ICM clinic phone appointment on 08/04/2017.  Office appointment scheduled 09/30/2017 with Dr. Lovena Le.  Copy of ICM check sent to device physician.   3 month ICM trend: 07/04/2017   1 Year ICM trend:      Rosalene Billings, RN 07/04/2017 9:54 AM

## 2017-08-04 ENCOUNTER — Telehealth: Payer: Self-pay

## 2017-08-04 ENCOUNTER — Ambulatory Visit (INDEPENDENT_AMBULATORY_CARE_PROVIDER_SITE_OTHER): Payer: Medicare Other

## 2017-08-04 DIAGNOSIS — I5022 Chronic systolic (congestive) heart failure: Secondary | ICD-10-CM | POA: Diagnosis not present

## 2017-08-04 DIAGNOSIS — Z95810 Presence of automatic (implantable) cardiac defibrillator: Secondary | ICD-10-CM | POA: Diagnosis not present

## 2017-08-04 NOTE — Telephone Encounter (Signed)
Remote ICM transmission received.  Attempted call to wife/patient and no answer or voice mail box.

## 2017-08-04 NOTE — Progress Notes (Signed)
EPIC Encounter for ICM Monitoring  Patient Name: [name] is a 83 y.o. male Date: 08/04/2017 Primary Care Physican: Manon Hilding, MD Primary Cardiologist:Taylor Electrophysiologist: Druscilla Brownie Weight:unknown - does not weigh       Attempted call to wife and unable to reach.    Transmission reviewed.    Thoracic impedance normal.  Prescribed dosage: Furosemide 20 mg 1 tablet as needed  NONE - Unable to reach patient   Follow-up plan: ICM clinic phone appointment on 09/04/2017.  Office appointment scheduled 09/30/2017 with Dr. Junie Spencer.  Copy of ICM check sent to Dr. Lovena Le.   3 month ICM trend: 08/04/2017   1 Year ICM trend:      Rosalene Billings, RN 08/04/2017 10:02 AM

## 2017-09-04 ENCOUNTER — Ambulatory Visit (INDEPENDENT_AMBULATORY_CARE_PROVIDER_SITE_OTHER): Payer: Medicare Other

## 2017-09-04 ENCOUNTER — Ambulatory Visit (INDEPENDENT_AMBULATORY_CARE_PROVIDER_SITE_OTHER): Payer: Medicare Other | Admitting: *Deleted

## 2017-09-04 ENCOUNTER — Telehealth: Payer: Self-pay

## 2017-09-04 DIAGNOSIS — Z95810 Presence of automatic (implantable) cardiac defibrillator: Secondary | ICD-10-CM

## 2017-09-04 DIAGNOSIS — I428 Other cardiomyopathies: Secondary | ICD-10-CM

## 2017-09-04 DIAGNOSIS — I5022 Chronic systolic (congestive) heart failure: Secondary | ICD-10-CM | POA: Diagnosis not present

## 2017-09-04 NOTE — Progress Notes (Signed)
Remote ICD transmission.   

## 2017-09-04 NOTE — Progress Notes (Signed)
EPIC Encounter for ICM Monitoring  Patient Name: [name] is a 83 y.o. male Date: 09/04/2017 Primary Care Physican: Manon Hilding, MD Primary Cardiologist:Taylor Electrophysiologist: Druscilla Brownie Weight:unknown - does not weigh                                                    Attempted call to wife and unable to reach.  Transmission reviewed.    Thoracic impedance abnormal suggesting fluid accumulation.  Prescribed dosage: Furosemide 20 mg 1 tablet as needed  Recommendations: NONE - Unable to reach patient   Follow-up plan: ICM clinic phone appointment on 09/11/2017.  Office appointment scheduled 09/30/2017 with Dr. Lovena Le.  Copy of ICM check sent to Dr. Lovena Le.   3 month ICM trend: 09/04/2017   1 Year ICM trend:      Rosalene Billings, RN 09/04/2017 1:23 PM

## 2017-09-04 NOTE — Telephone Encounter (Signed)
Remote ICM transmission received.  Attempted call to patient and no answer or answering machine.

## 2017-09-11 ENCOUNTER — Ambulatory Visit (INDEPENDENT_AMBULATORY_CARE_PROVIDER_SITE_OTHER): Payer: Self-pay

## 2017-09-11 ENCOUNTER — Telehealth: Payer: Self-pay

## 2017-09-11 ENCOUNTER — Encounter: Payer: Self-pay | Admitting: Cardiology

## 2017-09-11 DIAGNOSIS — Z95810 Presence of automatic (implantable) cardiac defibrillator: Secondary | ICD-10-CM

## 2017-09-11 DIAGNOSIS — I5022 Chronic systolic (congestive) heart failure: Secondary | ICD-10-CM

## 2017-09-11 NOTE — Progress Notes (Addendum)
EPIC Encounter for ICM Monitoring  Patient Name: [name] is a 83 y.o. male Date: 09/11/2017 Primary Care Physican: Manon Hilding, MD Primary Cardiologist:Taylor Electrophysiologist: Druscilla Brownie Weight:unknown - does not weigh       Attempted call to patient and unable to reach.    Transmission reviewed.    Thoracic impedance abnormal suggesting fluid accumulation.  Prescribed dosage: Furosemide 20 mg 1 tablet as needed  Recommendations: NONE - Unable to reach patient   Follow-up plan: ICM clinic phone appointment on 12/202018.  Office appointment scheduled 09/30/2017 with Dr Lovena Le.  Copy of ICM check sent to Dr. Lovena Le.   3 month ICM trend: 09/11/2017   1 Year ICM trend:      Rosalene Billings, RN 09/11/2017 1:14 PM

## 2017-09-11 NOTE — Telephone Encounter (Signed)
Remote ICM transmission received.  Attempted call to patient and line busy x 2.

## 2017-09-12 LAB — CUP PACEART REMOTE DEVICE CHECK
Battery Remaining Longevity: 94 mo
Battery Voltage: 2.99 V
Brady Statistic RA Percent Paced: 87.42 %
Brady Statistic RV Percent Paced: 1.19 %
Date Time Interrogation Session: 20181025084224
HIGH POWER IMPEDANCE MEASURED VALUE: 37 Ohm
HIGH POWER IMPEDANCE MEASURED VALUE: 44 Ohm
Implantable Lead Implant Date: 20090204
Implantable Lead Location: 753859
Implantable Lead Location: 753860
Implantable Lead Model: 5076
Lead Channel Impedance Value: 266 Ohm
Lead Channel Impedance Value: 399 Ohm
Lead Channel Pacing Threshold Amplitude: 0.875 V
Lead Channel Pacing Threshold Amplitude: 1 V
Lead Channel Pacing Threshold Pulse Width: 0.4 ms
Lead Channel Pacing Threshold Pulse Width: 0.4 ms
Lead Channel Sensing Intrinsic Amplitude: 3.5 mV
Lead Channel Sensing Intrinsic Amplitude: 3.5 mV
Lead Channel Setting Pacing Amplitude: 2.5 V
Lead Channel Setting Pacing Pulse Width: 0.4 ms
MDC IDC LEAD IMPLANT DT: 20090204
MDC IDC MSMT LEADCHNL RA SENSING INTR AMPL: 1.25 mV
MDC IDC MSMT LEADCHNL RA SENSING INTR AMPL: 1.25 mV
MDC IDC MSMT LEADCHNL RV IMPEDANCE VALUE: 342 Ohm
MDC IDC PG IMPLANT DT: 20161130
MDC IDC SET LEADCHNL RA PACING AMPLITUDE: 2 V
MDC IDC SET LEADCHNL RV SENSING SENSITIVITY: 0.3 mV
MDC IDC STAT BRADY AP VP PERCENT: 0.86 %
MDC IDC STAT BRADY AP VS PERCENT: 86.96 %
MDC IDC STAT BRADY AS VP PERCENT: 0.14 %
MDC IDC STAT BRADY AS VS PERCENT: 12.04 %

## 2017-09-26 ENCOUNTER — Other Ambulatory Visit: Payer: Self-pay | Admitting: Internal Medicine

## 2017-09-26 DIAGNOSIS — I493 Ventricular premature depolarization: Secondary | ICD-10-CM

## 2017-09-30 ENCOUNTER — Encounter (INDEPENDENT_AMBULATORY_CARE_PROVIDER_SITE_OTHER): Payer: Self-pay

## 2017-09-30 ENCOUNTER — Encounter: Payer: Self-pay | Admitting: Internal Medicine

## 2017-09-30 ENCOUNTER — Ambulatory Visit (INDEPENDENT_AMBULATORY_CARE_PROVIDER_SITE_OTHER): Payer: Medicare Other | Admitting: Internal Medicine

## 2017-09-30 VITALS — BP 138/82 | HR 81 | Ht 74.0 in | Wt 179.0 lb

## 2017-09-30 DIAGNOSIS — Z95810 Presence of automatic (implantable) cardiac defibrillator: Secondary | ICD-10-CM | POA: Diagnosis not present

## 2017-09-30 DIAGNOSIS — I5022 Chronic systolic (congestive) heart failure: Secondary | ICD-10-CM

## 2017-09-30 DIAGNOSIS — I428 Other cardiomyopathies: Secondary | ICD-10-CM | POA: Diagnosis not present

## 2017-09-30 LAB — CUP PACEART INCLINIC DEVICE CHECK
Battery Voltage: 3 V
Brady Statistic AS VP Percent: 0.02 %
Date Time Interrogation Session: 20181113115800
HighPow Impedance: 39 Ohm
HighPow Impedance: 50 Ohm
Implantable Lead Implant Date: 20090204
Implantable Lead Location: 753860
Implantable Pulse Generator Implant Date: 20161130
Lead Channel Impedance Value: 304 Ohm
Lead Channel Impedance Value: 456 Ohm
Lead Channel Pacing Threshold Amplitude: 1 V
Lead Channel Pacing Threshold Amplitude: 1 V
Lead Channel Pacing Threshold Pulse Width: 0.4 ms
Lead Channel Pacing Threshold Pulse Width: 0.4 ms
Lead Channel Sensing Intrinsic Amplitude: 1.5 mV
Lead Channel Setting Pacing Amplitude: 2.5 V
Lead Channel Setting Sensing Sensitivity: 0.3 mV
MDC IDC LEAD IMPLANT DT: 20090204
MDC IDC LEAD LOCATION: 753859
MDC IDC MSMT BATTERY REMAINING LONGEVITY: 96 mo
MDC IDC MSMT LEADCHNL RA SENSING INTR AMPL: 1.5 mV
MDC IDC MSMT LEADCHNL RV IMPEDANCE VALUE: 380 Ohm
MDC IDC MSMT LEADCHNL RV SENSING INTR AMPL: 3.5 mV
MDC IDC MSMT LEADCHNL RV SENSING INTR AMPL: 3.625 mV
MDC IDC SET LEADCHNL RA PACING AMPLITUDE: 2 V
MDC IDC SET LEADCHNL RV PACING PULSEWIDTH: 0.4 ms
MDC IDC STAT BRADY AP VP PERCENT: 0.21 %
MDC IDC STAT BRADY AP VS PERCENT: 96.53 %
MDC IDC STAT BRADY AS VS PERCENT: 3.24 %
MDC IDC STAT BRADY RA PERCENT PACED: 96.37 %
MDC IDC STAT BRADY RV PERCENT PACED: 0.27 %

## 2017-09-30 NOTE — Progress Notes (Signed)
HPI Mr. Majerus returns today for ongoing evaluation and management of Chronic systolic heart failure, symptomatic PVC's and ICD. In the interim he has remained active. He denies chest pain or sob. No syncope. He has not fallen and has gone back to deer hunting but usually sits in a ground blind, and not in a tree. No ICD shocks. No Known Allergies   Current Outpatient Medications  Medication Sig Dispense Refill  . amiodarone (PACERONE) 200 MG tablet TAKE 1 TABLET DAILY MONDAY-FRIDAY AND TAKE 1/2 TABLET ON SATURDAY & SUNDAY 81 tablet 1  . furosemide (LASIX) 40 MG tablet Take 20 mg by mouth daily as needed (swelling). Dr. Quintin Alto (PCP) told him to take this only as needed    . levothyroxine (SYNTHROID, LEVOTHROID) 50 MCG tablet Take 50 mcg by mouth daily.      Marland Kitchen losartan (COZAAR) 25 MG tablet Take 25 mg by mouth daily.    . metoprolol tartrate (LOPRESSOR) 25 MG tablet Take 25 mg by mouth 2 (two) times daily.     Marland Kitchen omega-3 acid ethyl esters (LOVAZA) 1 G capsule Take 1 g by mouth 2 (two) times daily.    . Tamsulosin HCl (FLOMAX) 0.4 MG CAPS Take 0.4 mg by mouth daily.     No current facility-administered medications for this visit.      Past Medical History:  Diagnosis Date  . Automatic implantable cardiac defibrillator in situ    MDT, implanted 2009  . CAD (coronary artery disease)    minimal by cath  . CHF NYHA class II ([place])   . DCM (dilated cardiomyopathy) ([place])     ROS:   All systems reviewed and negative except as noted in the HPI.   Past Surgical History:  Procedure Laterality Date  . CARDIAC CATHETERIZATION  12/17/07  . S/P ICD  2009     Family History  Problem Relation Age of Onset  . Tuberculosis Father      Social History   Socioeconomic History  . Marital status: Married    Spouse name: Not on file  . Number of children: Not on file  . Years of education: Not on file  . Highest education level: Not on file  Social Needs  . Financial resource  strain: Not on file  . Food insecurity - worry: Not on file  . Food insecurity - inability: Not on file  . Transportation needs - medical: Not on file  . Transportation needs - non-medical: Not on file  Occupational History  . Not on file  Tobacco Use  . Smoking status: Never Smoker  . Smokeless tobacco: Never Used  Substance and Sexual Activity  . Alcohol use: No  . Drug use: Not on file  . Sexual activity: Not on file  Other Topics Concern  . Not on file  Social History Narrative   Retired.      BP 138/82   Pulse 81   Ht 6\' 2"  (1.88 m)   Wt 179 lb (81.2 kg)   SpO2 96%   BMI 22.98 kg/m   Physical Exam:  Well appearing 83 yo man, NAD HEENT: Unremarkable Neck:  6 cm JVD, no thyromegally Lymphatics:  No adenopathy Back:  No CVA tenderness Lungs:  Clear with no wheezes HEART:  Regular rate rhythm, no murmurs, no rubs, no clicks Abd:  soft, positive bowel sounds, no organomegally, no rebound, no guarding Ext:  2 plus pulses, no edema, no cyanosis, no clubbing Skin:  No rashes  no nodules Neuro:  CN II through XII intact, motor grossly intact  EKG - NSR with atrial pacing and non-specific STT abnormality  DEVICE  Normal device function.  See PaceArt for details.   Assess/Plan: 1. Chronic systolic heart failure - his symptoms are well controlled. He is encouraged to continue his current meds and maintain a low sodium diet. 2. PVC's - he is mostly asymptomatic. He will continue his amiodarone.  3. ICD - his device is working normally. We will recheck in several months. 4. Thyroid dysfunction - he is taking supplemental synthroid. I have asked him to follow up with his primary MD and keep a check on TSH and thyroid supplementation.  Mikle Bosworth.D.

## 2017-09-30 NOTE — Patient Instructions (Signed)
Medication Instructions:  Your physician recommends that you continue on your current medications as directed. Please refer to the Current Medication list given to you today.  Labwork: None ordered.  Testing/Procedures: None ordered.  Follow-Up: Your physician wants you to follow-up in: one year with Dr. Lovena Le.   You will receive a reminder letter in the mail two months in advance. If you don't receive a letter, please call our office to schedule the follow-up appointment.  Remote monitoring is used to monitor your ICD from home. This monitoring reduces the number of office visits required to check your device to one time per year. It allows [place] to keep an eye on the functioning of your device to ensure it is working properly. You are scheduled for a device check from home on 11/06/2017. You may send your transmission at any time that day. If you have a wireless device, the transmission will be sent automatically. After your physician reviews your transmission, you will receive a postcard with your next transmission date.    Any Other Special Instructions Will Be Listed Below (If Applicable).     If you need a refill on your cardiac medications before your next appointment, please call your pharmacy.

## 2017-10-21 DIAGNOSIS — E039 Hypothyroidism, unspecified: Secondary | ICD-10-CM | POA: Diagnosis not present

## 2017-10-21 DIAGNOSIS — I482 Chronic atrial fibrillation: Secondary | ICD-10-CM | POA: Diagnosis not present

## 2017-10-21 DIAGNOSIS — I10 Essential (primary) hypertension: Secondary | ICD-10-CM | POA: Diagnosis not present

## 2017-10-21 DIAGNOSIS — N183 Chronic kidney disease, stage 3 (moderate): Secondary | ICD-10-CM | POA: Diagnosis not present

## 2017-10-23 DIAGNOSIS — E039 Hypothyroidism, unspecified: Secondary | ICD-10-CM | POA: Diagnosis not present

## 2017-10-23 DIAGNOSIS — Z0001 Encounter for general adult medical examination with abnormal findings: Secondary | ICD-10-CM | POA: Diagnosis not present

## 2017-10-23 DIAGNOSIS — I482 Chronic atrial fibrillation: Secondary | ICD-10-CM | POA: Diagnosis not present

## 2017-10-23 DIAGNOSIS — N401 Enlarged prostate with lower urinary tract symptoms: Secondary | ICD-10-CM | POA: Diagnosis not present

## 2017-10-23 DIAGNOSIS — Z1389 Encounter for screening for other disorder: Secondary | ICD-10-CM | POA: Diagnosis not present

## 2017-10-23 DIAGNOSIS — I10 Essential (primary) hypertension: Secondary | ICD-10-CM | POA: Diagnosis not present

## 2017-10-23 DIAGNOSIS — R2689 Other abnormalities of gait and mobility: Secondary | ICD-10-CM | POA: Diagnosis not present

## 2017-10-23 DIAGNOSIS — Z6822 Body mass index (BMI) 22.0-22.9, adult: Secondary | ICD-10-CM | POA: Diagnosis not present

## 2017-10-23 DIAGNOSIS — N183 Chronic kidney disease, stage 3 (moderate): Secondary | ICD-10-CM | POA: Diagnosis not present

## 2017-11-06 ENCOUNTER — Ambulatory Visit (INDEPENDENT_AMBULATORY_CARE_PROVIDER_SITE_OTHER): Payer: Medicare Other | Admitting: *Deleted

## 2017-11-06 ENCOUNTER — Ambulatory Visit (INDEPENDENT_AMBULATORY_CARE_PROVIDER_SITE_OTHER): Payer: Medicare Other

## 2017-11-06 DIAGNOSIS — I428 Other cardiomyopathies: Secondary | ICD-10-CM | POA: Diagnosis not present

## 2017-11-06 DIAGNOSIS — Z95810 Presence of automatic (implantable) cardiac defibrillator: Secondary | ICD-10-CM

## 2017-11-06 DIAGNOSIS — I5022 Chronic systolic (congestive) heart failure: Secondary | ICD-10-CM

## 2017-11-06 NOTE — Progress Notes (Signed)
Remote ICD transmission.   

## 2017-11-07 ENCOUNTER — Encounter: Payer: Self-pay | Admitting: Cardiology

## 2017-11-07 NOTE — Progress Notes (Signed)
EPIC Encounter for ICM Monitoring  Patient Name: [name] is a 83 y.o. male Date: 11/07/2017 Primary Care Physican: Manon Hilding, MD Primary Cardiologist:Taylor Electrophysiologist: Druscilla Brownie Weight:unknown - does not weigh       Transmission received.   Thoracic impedance normal.  Prescribed dosage: Furosemide 20 mg 1 tablet as needed  Recommendations: None.  Follow-up plan: ICM clinic phone appointment on 12/08/2017.    Copy of ICM check sent to Dr. Lovena Le.   3 month ICM trend: 11/06/2017    1 Year ICM trend:       Rosalene Billings, RN 11/07/2017 12:21 PM

## 2017-11-15 LAB — CUP PACEART REMOTE DEVICE CHECK
Brady Statistic AP VP Percent: 0.15 %
Brady Statistic AS VP Percent: 0.01 %
Brady Statistic AS VS Percent: 1.39 %
HighPow Impedance: 38 Ohm
HighPow Impedance: 46 Ohm
Implantable Lead Location: 753859
Implantable Lead Model: 6947
Lead Channel Pacing Threshold Amplitude: 0.75 V
Lead Channel Pacing Threshold Pulse Width: 0.4 ms
Lead Channel Sensing Intrinsic Amplitude: 1.5 mV
Lead Channel Sensing Intrinsic Amplitude: 1.5 mV
Lead Channel Sensing Intrinsic Amplitude: 4 mV
Lead Channel Setting Pacing Amplitude: 2.25 V
Lead Channel Setting Pacing Amplitude: 2.5 V
Lead Channel Setting Pacing Pulse Width: 0.4 ms
MDC IDC LEAD IMPLANT DT: 20090204
MDC IDC LEAD IMPLANT DT: 20090204
MDC IDC LEAD LOCATION: 753860
MDC IDC MSMT BATTERY REMAINING LONGEVITY: 91 mo
MDC IDC MSMT BATTERY VOLTAGE: 2.99 V
MDC IDC MSMT LEADCHNL RA IMPEDANCE VALUE: 437 Ohm
MDC IDC MSMT LEADCHNL RA PACING THRESHOLD AMPLITUDE: 1.125 V
MDC IDC MSMT LEADCHNL RA PACING THRESHOLD PULSEWIDTH: 0.4 ms
MDC IDC MSMT LEADCHNL RV IMPEDANCE VALUE: 266 Ohm
MDC IDC MSMT LEADCHNL RV IMPEDANCE VALUE: 380 Ohm
MDC IDC MSMT LEADCHNL RV SENSING INTR AMPL: 4 mV
MDC IDC PG IMPLANT DT: 20161130
MDC IDC SESS DTM: 20181220083523
MDC IDC SET LEADCHNL RV SENSING SENSITIVITY: 0.3 mV
MDC IDC STAT BRADY AP VS PERCENT: 98.46 %
MDC IDC STAT BRADY RA PERCENT PACED: 98.56 %
MDC IDC STAT BRADY RV PERCENT PACED: 0.17 %

## 2017-12-08 ENCOUNTER — Ambulatory Visit (INDEPENDENT_AMBULATORY_CARE_PROVIDER_SITE_OTHER): Payer: Medicare Other

## 2017-12-08 DIAGNOSIS — Z95810 Presence of automatic (implantable) cardiac defibrillator: Secondary | ICD-10-CM

## 2017-12-08 DIAGNOSIS — I5022 Chronic systolic (congestive) heart failure: Secondary | ICD-10-CM

## 2017-12-08 NOTE — Progress Notes (Signed)
EPIC Encounter for ICM Monitoring  Patient Name: [name] is a 84 y.o. male Date: 12/08/2017 Primary Care Physican: Manon Hilding, MD Primary Cardiologist:Taylor Electrophysiologist: Lovena Le Dry Weight:178 lbs        Heart Failure questions reviewed, pt asymptomatic.   Thoracic impedance normal but was abnormal suggesting fluid accumulation from 11/27/17 to 12/01/17.  Prescribed dosage: Furosemide 20 mg 1 tablet as needed.  Has not taken in the last month  Recommendations: No changes.  Encouraged to call for fluid symptoms.  Follow-up plan: ICM clinic phone appointment on 01/08/2018.    Copy of ICM check sent to Dr. Lovena Le.   3 month ICM trend: 12/08/2017    1 Year ICM trend:       Rosalene Billings, RN 12/08/2017 9:59 AM

## 2018-01-01 DIAGNOSIS — H04123 Dry eye syndrome of bilateral lacrimal glands: Secondary | ICD-10-CM | POA: Diagnosis not present

## 2018-01-01 DIAGNOSIS — H26492 Other secondary cataract, left eye: Secondary | ICD-10-CM | POA: Diagnosis not present

## 2018-01-01 DIAGNOSIS — Z961 Presence of intraocular lens: Secondary | ICD-10-CM | POA: Diagnosis not present

## 2018-01-01 DIAGNOSIS — H524 Presbyopia: Secondary | ICD-10-CM | POA: Diagnosis not present

## 2018-01-01 DIAGNOSIS — H40013 Open angle with borderline findings, low risk, bilateral: Secondary | ICD-10-CM | POA: Diagnosis not present

## 2018-01-08 ENCOUNTER — Ambulatory Visit (INDEPENDENT_AMBULATORY_CARE_PROVIDER_SITE_OTHER): Payer: Medicare Other

## 2018-01-08 DIAGNOSIS — I5022 Chronic systolic (congestive) heart failure: Secondary | ICD-10-CM | POA: Diagnosis not present

## 2018-01-08 DIAGNOSIS — Z95810 Presence of automatic (implantable) cardiac defibrillator: Secondary | ICD-10-CM

## 2018-01-09 NOTE — Progress Notes (Signed)
EPIC Encounter for ICM Monitoring  Patient Name: [name] is a 84 y.o. male Date: 01/09/2018 Primary Care Physican: Manon Hilding, MD Primary Cardiologist:Taylor Electrophysiologist: Lovena Le Dry Weight:180 lbs      Heart Failure questions reviewed, pt asymptomatic.   Thoracic impedance normal.  Prescribed dosage: Furosemide 20 mg 1 tablet as needed.   Recommendations: No changes.  Encouraged to call for fluid symptoms.  Follow-up plan: ICM clinic phone appointment on 02/09/2018.    Copy of ICM check sent to Dr. Lovena Le.   3 month ICM trend: 01/08/2018    1 Year ICM trend:       Rosalene Billings, RN 01/09/2018 12:22 PM

## 2018-02-09 ENCOUNTER — Ambulatory Visit (INDEPENDENT_AMBULATORY_CARE_PROVIDER_SITE_OTHER): Payer: Medicare Other | Admitting: *Deleted

## 2018-02-09 DIAGNOSIS — I5022 Chronic systolic (congestive) heart failure: Secondary | ICD-10-CM | POA: Diagnosis not present

## 2018-02-09 DIAGNOSIS — Z95810 Presence of automatic (implantable) cardiac defibrillator: Secondary | ICD-10-CM

## 2018-02-09 DIAGNOSIS — I428 Other cardiomyopathies: Secondary | ICD-10-CM | POA: Diagnosis not present

## 2018-02-09 NOTE — Progress Notes (Signed)
Remote ICD transmission.   

## 2018-02-09 NOTE — Progress Notes (Signed)
EPIC Encounter for ICM Monitoring  Patient Name: [name] is a 84 y.o. male Date: 02/09/2018 Primary Care Physican: Manon Hilding, MD Primary Cardiologist:Taylor Electrophysiologist: Lovena Le Dry Weight:180 lbs          Heart Failure questions reviewed, pt asymptomatic.  He reported feeling fine and has been working outside today.   Thoracic impedance normal.  Prescribed dosage: Furosemide 20 mg 1 tablet as needed.   Recommendations: No changes.  Encouraged to call for fluid symptoms.  Follow-up plan: ICM clinic phone appointment on 03/12/2018.   Copy of ICM check sent to Dr. Lovena Le.   3 month ICM trend: 02/09/2018    1 Year ICM trend:       Rosalene Billings, RN 02/09/2018 1:36 PM

## 2018-02-10 ENCOUNTER — Encounter: Payer: Self-pay | Admitting: Cardiology

## 2018-02-10 LAB — CUP PACEART REMOTE DEVICE CHECK
Battery Voltage: 2.99 V
Brady Statistic AP VP Percent: 0.04 %
Brady Statistic AS VP Percent: 0 %
Brady Statistic RA Percent Paced: 99.77 %
Brady Statistic RV Percent Paced: 0.04 %
Date Time Interrogation Session: 20190325041702
HIGH POWER IMPEDANCE MEASURED VALUE: 46 Ohm
HighPow Impedance: 58 Ohm
Implantable Lead Location: 753859
Implantable Lead Location: 753860
Implantable Lead Model: 5076
Lead Channel Impedance Value: 323 Ohm
Lead Channel Impedance Value: 399 Ohm
Lead Channel Pacing Threshold Amplitude: 0.875 V
Lead Channel Pacing Threshold Amplitude: 1 V
Lead Channel Sensing Intrinsic Amplitude: 1.375 mV
Lead Channel Sensing Intrinsic Amplitude: 3.75 mV
Lead Channel Setting Pacing Amplitude: 2.5 V
Lead Channel Setting Pacing Pulse Width: 0.4 ms
MDC IDC LEAD IMPLANT DT: 20090204
MDC IDC LEAD IMPLANT DT: 20090204
MDC IDC MSMT BATTERY REMAINING LONGEVITY: 90 mo
MDC IDC MSMT LEADCHNL RA IMPEDANCE VALUE: 456 Ohm
MDC IDC MSMT LEADCHNL RA PACING THRESHOLD PULSEWIDTH: 0.4 ms
MDC IDC MSMT LEADCHNL RA SENSING INTR AMPL: 1.375 mV
MDC IDC MSMT LEADCHNL RV PACING THRESHOLD PULSEWIDTH: 0.4 ms
MDC IDC MSMT LEADCHNL RV SENSING INTR AMPL: 3.75 mV
MDC IDC PG IMPLANT DT: 20161130
MDC IDC SET LEADCHNL RA PACING AMPLITUDE: 2 V
MDC IDC SET LEADCHNL RV SENSING SENSITIVITY: 0.3 mV
MDC IDC STAT BRADY AP VS PERCENT: 99.74 %
MDC IDC STAT BRADY AS VS PERCENT: 0.22 %

## 2018-02-11 ENCOUNTER — Telehealth: Payer: Self-pay | Admitting: Interventional Cardiology

## 2018-02-11 DIAGNOSIS — Z7982 Long term (current) use of aspirin: Secondary | ICD-10-CM | POA: Diagnosis not present

## 2018-02-11 DIAGNOSIS — R1111 Vomiting without nausea: Secondary | ICD-10-CM | POA: Diagnosis not present

## 2018-02-11 DIAGNOSIS — R000 Tachycardia, unspecified: Secondary | ICD-10-CM | POA: Diagnosis not present

## 2018-02-11 DIAGNOSIS — R42 Dizziness and giddiness: Secondary | ICD-10-CM | POA: Diagnosis not present

## 2018-02-11 DIAGNOSIS — Z79899 Other long term (current) drug therapy: Secondary | ICD-10-CM | POA: Diagnosis not present

## 2018-02-11 DIAGNOSIS — R404 Transient alteration of awareness: Secondary | ICD-10-CM | POA: Diagnosis not present

## 2018-02-11 DIAGNOSIS — R55 Syncope and collapse: Secondary | ICD-10-CM | POA: Diagnosis not present

## 2018-02-11 NOTE — Telephone Encounter (Signed)
Spoke with [facility] emergency room physician concerning Yoskar Murrillo who suddenly felt presyncopal at home.  EMS was summoned and when they arrived the patient was noted to be in "V. tach".  During transportation to the emergency room, EMTs gave the patient IV adenosine and he lost consciousness.  The patient did not have a defibrillator discharge.  He did have a external defibrillation with 100 J resulting in normal sinus rhythm.  The patient is back to baseline in the emergency department.  Review of chart demonstrates recent normal pacer/device function and recent telemetry for volume status was normal/stable.  The patient is being worked up in the emergency room to make sure there is no enzyme elevation, electrolyte disturbance, heart failure on chest x-ray, or other abnormality.  If all is stable, the patient prefers to be discharged home.  He will need to follow-up with electrophysiology here in [place] within 5-7 days.  If any abnormalities are noted on the above workup, we will be notified and he will be shipped to [facility] to be evaluated by EP.

## 2018-02-12 ENCOUNTER — Telehealth: Payer: Self-pay | Admitting: Internal Medicine

## 2018-02-12 NOTE — Telephone Encounter (Signed)
Sounds like VT below the detection rate. Please add on to my clinic tomorrow. GT

## 2018-02-12 NOTE — Telephone Encounter (Signed)
Call placed to Pt.  Notified Pt that Dr. Lovena Le would like to see him tomorrow.  Pt states he will ask his brother to bring him at 9:30 am.  Notified Pt that would be fine.   Unable to add to schedule-will have scheduling add in the morning.

## 2018-02-12 NOTE — Telephone Encounter (Signed)
Follow up     Spoke with triage they said to send to device per [place]  Patient went to [facility] in [place]

## 2018-02-12 NOTE — Telephone Encounter (Signed)
New Message    Pt c/o Syncope: STAT if syncope occurred within 30 minutes and pt complains of lightheadedness High Priority if episode of passing out, completely, today or in last 24 hours   1. Did you pass out today? No last night   2. When is the last time you passed out? At 4pm 02/12/18   3. Has this occurred multiple times? No   4. Did you have any symptoms prior to passing out? He started sweating , and when the ambulance to there they had to give him medication and then shock his heart because his device did not kick, in they kept him until 11pm last night

## 2018-02-12 NOTE — Telephone Encounter (Signed)
Spoke with patient who states that he is now at home and will send a remote transmission with his home monitor for review. Patient declined needing assistance in sending remote. Will await transmission.

## 2018-02-12 NOTE — Telephone Encounter (Signed)
successfully received remote transmission - will review with GT for recommendations, patient is currently declining an OV until remote transmission is reviewed with Dr. Lovena Le.

## 2018-02-13 ENCOUNTER — Ambulatory Visit (INDEPENDENT_AMBULATORY_CARE_PROVIDER_SITE_OTHER): Payer: Medicare Other | Admitting: Internal Medicine

## 2018-02-13 ENCOUNTER — Encounter: Payer: Self-pay | Admitting: Internal Medicine

## 2018-02-13 VITALS — BP 130/80 | HR 56 | Ht 75.0 in | Wt 177.0 lb

## 2018-02-13 DIAGNOSIS — I428 Other cardiomyopathies: Secondary | ICD-10-CM | POA: Diagnosis not present

## 2018-02-13 DIAGNOSIS — I5022 Chronic systolic (congestive) heart failure: Secondary | ICD-10-CM | POA: Diagnosis not present

## 2018-02-13 DIAGNOSIS — I4729 Other ventricular tachycardia: Secondary | ICD-10-CM

## 2018-02-13 DIAGNOSIS — I472 Ventricular tachycardia: Secondary | ICD-10-CM

## 2018-02-13 DIAGNOSIS — Z95810 Presence of automatic (implantable) cardiac defibrillator: Secondary | ICD-10-CM

## 2018-02-13 LAB — CUP PACEART INCLINIC DEVICE CHECK
Battery Remaining Longevity: 89 mo
Battery Voltage: 2.99 V
Brady Statistic AS VP Percent: 0 %
Brady Statistic RA Percent Paced: 99.36 %
Brady Statistic RV Percent Paced: 0.12 %
Date Time Interrogation Session: 20190329135552
HIGH POWER IMPEDANCE MEASURED VALUE: 43 Ohm
HighPow Impedance: 57 Ohm
Implantable Lead Implant Date: 20090204
Implantable Lead Model: 5076
Implantable Lead Model: 6947
Implantable Pulse Generator Implant Date: 20161130
Lead Channel Impedance Value: 323 Ohm
Lead Channel Impedance Value: 380 Ohm
Lead Channel Impedance Value: 437 Ohm
Lead Channel Pacing Threshold Amplitude: 0.875 V
Lead Channel Pacing Threshold Amplitude: 1 V
Lead Channel Sensing Intrinsic Amplitude: 1.375 mV
Lead Channel Sensing Intrinsic Amplitude: 3.625 mV
Lead Channel Setting Pacing Amplitude: 2 V
Lead Channel Setting Pacing Pulse Width: 0.4 ms
MDC IDC LEAD IMPLANT DT: 20090204
MDC IDC LEAD LOCATION: 753859
MDC IDC LEAD LOCATION: 753860
MDC IDC MSMT LEADCHNL RA PACING THRESHOLD PULSEWIDTH: 0.4 ms
MDC IDC MSMT LEADCHNL RA SENSING INTR AMPL: 1.5 mV
MDC IDC MSMT LEADCHNL RV PACING THRESHOLD PULSEWIDTH: 0.4 ms
MDC IDC MSMT LEADCHNL RV SENSING INTR AMPL: 3.75 mV
MDC IDC SET LEADCHNL RV PACING AMPLITUDE: 2.5 V
MDC IDC SET LEADCHNL RV SENSING SENSITIVITY: 0.3 mV
MDC IDC STAT BRADY AP VP PERCENT: 0.11 %
MDC IDC STAT BRADY AP VS PERCENT: 99.3 %
MDC IDC STAT BRADY AS VS PERCENT: 0.59 %

## 2018-02-13 MED ORDER — AMIODARONE HCL 200 MG PO TABS: ORAL_TABLET | ORAL | 3 refills | Status: DC

## 2018-02-13 NOTE — Progress Notes (Signed)
HPI Mr. Mcbryar returns today for followup. He is a pleasant 84 yo man with chronic systolic heart failure, s/p ICD insertion, dense ventricular ectopy, on low dose amiodarone. He experienced and episode of VT with syncope and was cardioverted while awake and taken to the ED for evaluation. He feels well and denies any worsening of his CHF. He has not missed any meds although he does admit to some dietary indiscretion.  No Known Allergies   Current Outpatient Medications  Medication Sig Dispense Refill  . finasteride (PROSCAR) 5 MG tablet Take 1 tablet by mouth daily.    . furosemide (LASIX) 40 MG tablet Take 20 mg by mouth daily as needed (swelling). Dr. Quintin Alto (PCP) told him to take this only as needed    . levothyroxine (SYNTHROID, LEVOTHROID) 50 MCG tablet Take 50 mcg by mouth daily.      Marland Kitchen losartan (COZAAR) 25 MG tablet Take 25 mg by mouth daily.    . metoprolol tartrate (LOPRESSOR) 25 MG tablet Take 25 mg by mouth 2 (two) times daily.     Marland Kitchen omega-3 acid ethyl esters (LOVAZA) 1 G capsule Take 1 g by mouth 2 (two) times daily.    . Tamsulosin HCl (FLOMAX) 0.4 MG CAPS Take 0.4 mg by mouth daily.    Marland Kitchen amiodarone (PACERONE) 200 MG tablet Take one tab by mouth twice a day until Mar 18 2018.  Then reduce to daily. 120 tablet 3   No current facility-administered medications for this visit.      Past Medical History:  Diagnosis Date  . Automatic implantable cardiac defibrillator in situ    MDT, implanted 2009  . CAD (coronary artery disease)    minimal by cath  . CHF NYHA class II ([place])   . DCM (dilated cardiomyopathy) ([place])     ROS:   All systems reviewed and negative except as noted in the HPI.   Past Surgical History:  Procedure Laterality Date  . CARDIAC CATHETERIZATION  12/17/07  . EP IMPLANTABLE DEVICE N/A 10/18/2015   Procedure: ICD Generator Changeout;  Surgeon: Evans Lance, MD;  Location: [place] [facility];  Service: Cardiovascular;  Laterality: N/A;  . S/P  ICD  2009     Family History  Problem Relation Age of Onset  . Tuberculosis Father      Social History   Socioeconomic History  . Marital status: Married    Spouse name: Not on file  . Number of children: Not on file  . Years of education: Not on file  . Highest education level: Not on file  Occupational History  . Not on file  Social Needs  . Financial resource strain: Not on file  . Food insecurity:    Worry: Not on file    Inability: Not on file  . Transportation needs:    Medical: Not on file    Non-medical: Not on file  Tobacco Use  . Smoking status: Never Smoker  . Smokeless tobacco: Never Used  Substance and Sexual Activity  . Alcohol use: No  . Drug use: Not on file  . Sexual activity: Not on file  Lifestyle  . Physical activity:    Days per week: Not on file    Minutes per session: Not on file  . Stress: Not on file  Relationships  . Social connections:    Talks on phone: Not on file    Gets together: Not on file    Attends religious service:  Not on file    Active member of club or organization: Not on file    Attends meetings of clubs or organizations: Not on file    Relationship status: Not on file  . Intimate partner violence:    Fear of current or ex partner: Not on file    Emotionally abused: Not on file    Physically abused: Not on file    Forced sexual activity: Not on file  Other Topics Concern  . Not on file  Social History Narrative   Retired.      BP 130/80   Pulse (!) 56   Ht 6\' 3"  (1.905 m)   Wt 177 lb (80.3 kg)   SpO2 96%   BMI 22.12 kg/m   Physical Exam:  Well appearing NAD HEENT: Unremarkable Neck:  No JVD, no thyromegally Lymphatics:  No adenopathy Back:  No CVA tenderness Lungs:  Clear with no wheezes HEART:  Regular rate rhythm, no murmurs, no rubs, no clicks Abd:  soft, positive bowel sounds, no organomegally, no rebound, no guarding Ext:  2 plus pulses, no edema, no cyanosis, no clubbing Skin:  No rashes no  nodules Neuro:  CN II through XII intact, motor grossly intact  EKG - none  DEVICE  Normal device function.  See PaceArt for details.   Assess/Plan: 1. VT - I have asked the patient to increase his amiodarone 2. Chronic systolic heart failure - his symptoms remain class 2. No change in meds for now. 3. ICD - I have reprogrammed his device to a lower VT detection zone.  Mikle Bosworth.D.

## 2018-02-13 NOTE — Patient Instructions (Addendum)
Medication Instructions:  Your physician has recommended you make the following change in your medication:  1.  Increase your amiodarone to 200 mg one tablet by mouth twice a day until Mar 18, 2018. Then, reduce to Amiodarone 200 mg one tablet by mouth DAILY.  Labwork: None ordered.  Testing/Procedures: None ordered.  Follow-Up: Your physician wants you to follow-up end of August with Dr. Lovena Le.     Remote monitoring is used to monitor your ICD from home. This monitoring reduces the number of office visits required to check your device to one time per year. It allows [place] to keep an eye on the functioning of your device to ensure it is working properly. You are scheduled for a device check from home on 03/12/2018. You may send your transmission at any time that day. If you have a wireless device, the transmission will be sent automatically. After your physician reviews your transmission, you will receive a postcard with your next transmission date.  Any Other Special Instructions Will Be Listed Below (If Applicable).  If you need a refill on your cardiac medications before your next appointment, please call your pharmacy.

## 2018-02-13 NOTE — Telephone Encounter (Signed)
Appt scheduled

## 2018-02-14 ENCOUNTER — Encounter: Payer: Self-pay | Admitting: Internal Medicine

## 2018-02-25 ENCOUNTER — Telehealth: Payer: Self-pay | Admitting: Internal Medicine

## 2018-02-25 DIAGNOSIS — I499 Cardiac arrhythmia, unspecified: Secondary | ICD-10-CM | POA: Diagnosis not present

## 2018-02-25 DIAGNOSIS — Z7982 Long term (current) use of aspirin: Secondary | ICD-10-CM | POA: Diagnosis not present

## 2018-02-25 DIAGNOSIS — Z95810 Presence of automatic (implantable) cardiac defibrillator: Secondary | ICD-10-CM | POA: Diagnosis not present

## 2018-02-25 DIAGNOSIS — T82118A Breakdown (mechanical) of other cardiac electronic device, initial encounter: Secondary | ICD-10-CM | POA: Diagnosis not present

## 2018-02-25 DIAGNOSIS — R001 Bradycardia, unspecified: Secondary | ICD-10-CM | POA: Diagnosis not present

## 2018-02-25 DIAGNOSIS — R32 Unspecified urinary incontinence: Secondary | ICD-10-CM | POA: Diagnosis not present

## 2018-02-25 DIAGNOSIS — Z79899 Other long term (current) drug therapy: Secondary | ICD-10-CM | POA: Diagnosis not present

## 2018-02-25 DIAGNOSIS — R079 Chest pain, unspecified: Secondary | ICD-10-CM | POA: Diagnosis not present

## 2018-02-25 DIAGNOSIS — R911 Solitary pulmonary nodule: Secondary | ICD-10-CM | POA: Diagnosis not present

## 2018-02-25 DIAGNOSIS — R55 Syncope and collapse: Secondary | ICD-10-CM | POA: Diagnosis not present

## 2018-02-25 DIAGNOSIS — R61 Generalized hyperhidrosis: Secondary | ICD-10-CM | POA: Diagnosis not present

## 2018-02-25 NOTE — Telephone Encounter (Signed)
Spoke with pt had pt send in a transmission, pt had 2 episodes of VT treated with ATP pt stated that he went to the ED in [facility], pt stated that he was taking his medications as prescribed, informed pt that I would review this with Dr. Lovena Le and would call back with any recommendations on Friday or first of next week, also informed pt that if the next time he feels like his ICD fired and he calls EMS then to have them take him to [facility]. Pt voiced understanding.

## 2018-02-25 NOTE — Telephone Encounter (Signed)
New Message:     Pt states he was in the emergency room because his defib went out and he passed out. Pt states dr. Was supposed to call him today at 3:00 and has not heard anything today. Dr. From ER told  Him to follow up with [place] to see what he needs to do.

## 2018-02-27 ENCOUNTER — Telehealth: Payer: Self-pay

## 2018-02-27 NOTE — Telephone Encounter (Signed)
Called pt to make device clinic apt for programming changes recommended by GT

## 2018-03-02 ENCOUNTER — Ambulatory Visit (INDEPENDENT_AMBULATORY_CARE_PROVIDER_SITE_OTHER): Payer: Self-pay | Admitting: *Deleted

## 2018-03-02 ENCOUNTER — Telehealth: Payer: Self-pay | Admitting: Internal Medicine

## 2018-03-02 DIAGNOSIS — I428 Other cardiomyopathies: Secondary | ICD-10-CM

## 2018-03-02 DIAGNOSIS — I5022 Chronic systolic (congestive) heart failure: Secondary | ICD-10-CM

## 2018-03-02 LAB — CUP PACEART INCLINIC DEVICE CHECK
Battery Voltage: 2.99 V
Brady Statistic AP VP Percent: 0.16 %
Brady Statistic AP VS Percent: 98.69 %
Brady Statistic AS VP Percent: 0 %
Brady Statistic AS VS Percent: 1.14 %
Brady Statistic RV Percent Paced: 0.23 %
HighPow Impedance: 44 Ohm
HighPow Impedance: 54 Ohm
Implantable Lead Implant Date: 20090204
Implantable Lead Implant Date: 20090204
Implantable Lead Location: 753859
Implantable Lead Location: 753860
Implantable Lead Model: 6947
Implantable Pulse Generator Implant Date: 20161130
Lead Channel Impedance Value: 380 Ohm
Lead Channel Impedance Value: 437 Ohm
Lead Channel Pacing Threshold Amplitude: 0.75 V
Lead Channel Pacing Threshold Amplitude: 1.125 V
Lead Channel Pacing Threshold Pulse Width: 0.4 ms
Lead Channel Sensing Intrinsic Amplitude: 1.5 mV
Lead Channel Sensing Intrinsic Amplitude: 1.5 mV
Lead Channel Sensing Intrinsic Amplitude: 4.375 mV
Lead Channel Sensing Intrinsic Amplitude: 4.375 mV
Lead Channel Setting Pacing Amplitude: 2.25 V
Lead Channel Setting Pacing Amplitude: 2.5 V
Lead Channel Setting Pacing Pulse Width: 0.4 ms
MDC IDC MSMT BATTERY REMAINING LONGEVITY: 85 mo
MDC IDC MSMT LEADCHNL RA PACING THRESHOLD PULSEWIDTH: 0.4 ms
MDC IDC MSMT LEADCHNL RV IMPEDANCE VALUE: 304 Ohm
MDC IDC SESS DTM: 20190415145422
MDC IDC SET LEADCHNL RV SENSING SENSITIVITY: 0.3 mV
MDC IDC STAT BRADY RA PERCENT PACED: 98.77 %

## 2018-03-02 NOTE — Progress Notes (Signed)
ICD check in clinic for reprogramming. Normal device function. Testing was not performed this session. (2) VT episodes treated successfully w/ATP. (2) monitored VT episodes. (1) VT-NS. No mode switches. VT detection decreased to 150bpm, VT Rx 1(%RR) decreased to 84%, VT Rx 2 (%RR) decreased to 88%- per GT. Patient to follow up as scheduled.

## 2018-03-02 NOTE — Telephone Encounter (Signed)
Dr.Taylor recommended changes to ICD settings (per Theodoro Doing, RN). Appt made with the device clinic for today @ 1400. Patient verbalized understanding.

## 2018-03-02 NOTE — Telephone Encounter (Signed)
New message   Patient calling concerns of dizziness. Patient states he was "shocked" last week and was taken to [facility].  Patient feels he needs to be seen today. Please call on his daughters cell # [phone].

## 2018-03-12 ENCOUNTER — Ambulatory Visit (INDEPENDENT_AMBULATORY_CARE_PROVIDER_SITE_OTHER): Payer: Self-pay

## 2018-03-12 DIAGNOSIS — Z95810 Presence of automatic (implantable) cardiac defibrillator: Secondary | ICD-10-CM

## 2018-03-12 DIAGNOSIS — I5022 Chronic systolic (congestive) heart failure: Secondary | ICD-10-CM

## 2018-03-13 ENCOUNTER — Telehealth: Payer: Self-pay

## 2018-03-13 NOTE — Progress Notes (Signed)
EPIC Encounter for ICM Monitoring  Patient Name: [name] is a 84 y.o. male Date: 03/13/2018 Primary Care Physican: Manon Hilding, MD Primary Cardiologist:Taylor Electrophysiologist: Druscilla Brownie Weight:Previous weight 180lbs        Attempted call to patient and unable to reach.   Transmission reviewed.  [place] ER visit for 2 episodes of VT treated with ATP on 02/25/2018.    Thoracic impedance slightly below baseline normal.  Prescribed dosage: Furosemide 20 mg 1 tablet as needed.  Recommendations: NONE - Unable to reach.  Follow-up plan: ICM clinic phone appointment on 04/14/2018.   Copy of ICM check sent to Dr. Lovena Le.   3 month ICM trend: 03/12/2018    1 Year ICM trend:       Rosalene Billings, RN 03/13/2018 7:44 AM

## 2018-03-13 NOTE — Telephone Encounter (Signed)
Remote ICM transmission received.  Attempted call to patient and no answer or answering machine.

## 2018-03-16 ENCOUNTER — Telehealth: Payer: Self-pay | Admitting: Internal Medicine

## 2018-03-16 NOTE — Telephone Encounter (Signed)
New Message    Pt c/o medication issue:  1. Name of Medication:  losartan (COZAAR) 25 MG tablet Take 25 mg by mouth daily.   metoprolol tartrate (LOPRESSOR) 25 MG tablet Take 25 mg by mouth 2 (two) times daily.     2. How are you currently taking this medication (dosage and times per day)?  As prescribed   3. Are you having a reaction (difficulty breathing--STAT)?  Having staggering and dizziness   4. What is your medication issue?  Pharmacy put the wrong medication in the metoprolol bottle , he has been taking 3 of the losartan and 0 metoprolol

## 2018-03-16 NOTE — Telephone Encounter (Signed)
Pt came to office. Spoke with Pt.  Per Pt he has been taking more metoprolol, not losartan.  His pharmacy put metoprolol in his losartan bottle (per Pt).  This was 3 weeks ago.  Notified Pt that he should not still be feeling dizzy off balance from this medication error.  Pt will decrease his amiodarone to one tablet tomorrow.  Notified Pt he will probably start to feel better when his amio is decreased. Discussed why Pt had a 6 month no driving limitation (based on state law).  Pt and daughter indicate understanding.  Advised Pt to continue to take his meds as ordered.  If he continues to feel dizzy call the office. Pt and daughter grateful for information.

## 2018-04-14 ENCOUNTER — Ambulatory Visit (INDEPENDENT_AMBULATORY_CARE_PROVIDER_SITE_OTHER): Payer: Medicare Other

## 2018-04-14 DIAGNOSIS — Z95810 Presence of automatic (implantable) cardiac defibrillator: Secondary | ICD-10-CM | POA: Diagnosis not present

## 2018-04-14 DIAGNOSIS — I5022 Chronic systolic (congestive) heart failure: Secondary | ICD-10-CM | POA: Diagnosis not present

## 2018-04-14 NOTE — Progress Notes (Signed)
EPIC Encounter for ICM Monitoring  Patient Name: [name] is a 84 y.o. male Date: 04/14/2018 Primary Care Physican: Manon Hilding, MD Primary Cardiologist:Taylor Electrophysiologist: Druscilla Brownie Weight:Previous weight 180lbs      Heart Failure questions reviewed, pt has been to [facility] in April for 1 defib shock and one syncope episode which he was shocked back into rhythm in the ambulance. He said Dr Lovena Le is aware of episodes.  His wife passed away suddenly 3 weeks ago. They would have been married 69 years in June.     Thoracic impedance normal.  Prescribed dosage: Furosemide 20 mg 1 tablet as needed.  Recommendations: No changes.  Encouraged to call for fluid symptoms.  Follow-up plan: ICM clinic phone appointment on 05/18/2018.    Copy of ICM check sent to Dr. Lovena Le.   3 month ICM trend: 04/14/2018    1 Year ICM trend:       Rosalene Billings, RN 04/14/2018 12:41 PM

## 2018-04-21 DIAGNOSIS — N183 Chronic kidney disease, stage 3 (moderate): Secondary | ICD-10-CM | POA: Diagnosis not present

## 2018-04-21 DIAGNOSIS — E782 Mixed hyperlipidemia: Secondary | ICD-10-CM | POA: Diagnosis not present

## 2018-04-21 DIAGNOSIS — I482 Chronic atrial fibrillation: Secondary | ICD-10-CM | POA: Diagnosis not present

## 2018-04-21 DIAGNOSIS — R61 Generalized hyperhidrosis: Secondary | ICD-10-CM | POA: Diagnosis not present

## 2018-04-21 DIAGNOSIS — E039 Hypothyroidism, unspecified: Secondary | ICD-10-CM | POA: Diagnosis not present

## 2018-04-21 DIAGNOSIS — I10 Essential (primary) hypertension: Secondary | ICD-10-CM | POA: Diagnosis not present

## 2018-04-23 DIAGNOSIS — N401 Enlarged prostate with lower urinary tract symptoms: Secondary | ICD-10-CM | POA: Diagnosis not present

## 2018-04-23 DIAGNOSIS — I482 Chronic atrial fibrillation: Secondary | ICD-10-CM | POA: Diagnosis not present

## 2018-04-23 DIAGNOSIS — E039 Hypothyroidism, unspecified: Secondary | ICD-10-CM | POA: Diagnosis not present

## 2018-04-23 DIAGNOSIS — R2689 Other abnormalities of gait and mobility: Secondary | ICD-10-CM | POA: Diagnosis not present

## 2018-04-23 DIAGNOSIS — E782 Mixed hyperlipidemia: Secondary | ICD-10-CM | POA: Diagnosis not present

## 2018-04-23 DIAGNOSIS — Z6822 Body mass index (BMI) 22.0-22.9, adult: Secondary | ICD-10-CM | POA: Diagnosis not present

## 2018-04-23 DIAGNOSIS — I10 Essential (primary) hypertension: Secondary | ICD-10-CM | POA: Diagnosis not present

## 2018-04-23 DIAGNOSIS — N183 Chronic kidney disease, stage 3 (moderate): Secondary | ICD-10-CM | POA: Diagnosis not present

## 2018-05-18 ENCOUNTER — Ambulatory Visit (INDEPENDENT_AMBULATORY_CARE_PROVIDER_SITE_OTHER): Payer: Medicare Other | Admitting: *Deleted

## 2018-05-18 ENCOUNTER — Ambulatory Visit (INDEPENDENT_AMBULATORY_CARE_PROVIDER_SITE_OTHER): Payer: Medicare Other

## 2018-05-18 DIAGNOSIS — I5022 Chronic systolic (congestive) heart failure: Secondary | ICD-10-CM | POA: Diagnosis not present

## 2018-05-18 DIAGNOSIS — I429 Cardiomyopathy, unspecified: Secondary | ICD-10-CM | POA: Diagnosis not present

## 2018-05-18 DIAGNOSIS — Z95810 Presence of automatic (implantable) cardiac defibrillator: Secondary | ICD-10-CM

## 2018-05-18 NOTE — Progress Notes (Signed)
EPIC Encounter for ICM Monitoring  Patient Name: [name] is a 84 y.o. male Date: 05/18/2018 Primary Care Physican: Manon Hilding, MD Primary Cardiologist:Taylor Electrophysiologist: Druscilla Brownie Weight:Previous weight180lbs      Heart Failure questions reviewed, pt asymptomatic.  His wife passed away suddenly in 04-Mar-2018. They would have been married 59 years in June.          Thoracic impedance normal.  Prescribed dosage: Furosemide 20 mg 1 tablet as needed.  Recommendations: No changes.   Encouraged to call for fluid symptoms.  Follow-up plan: ICM clinic phone appointment on 06/18/2018.  Office appointment scheduled 07/13/2018 with Dr. Lovena Le.  Copy of ICM check sent to Dr. Lovena Le.   3 month ICM trend: 05/18/2018    1 Year ICM trend:       Rosalene Billings, RN 05/18/2018 2:06 PM

## 2018-05-18 NOTE — Progress Notes (Signed)
Remote ICD transmission.   

## 2018-05-28 LAB — CUP PACEART REMOTE DEVICE CHECK
Battery Remaining Longevity: 81 mo
Brady Statistic AP VS Percent: 99.69 %
Brady Statistic AS VP Percent: 0 %
Brady Statistic RA Percent Paced: 99.9 %
Date Time Interrogation Session: 20190701042202
HIGH POWER IMPEDANCE MEASURED VALUE: 45 Ohm
HighPow Impedance: 56 Ohm
Implantable Lead Implant Date: 20090204
Implantable Lead Model: 5076
Implantable Lead Model: 6947
Implantable Pulse Generator Implant Date: 20161130
Lead Channel Impedance Value: 323 Ohm
Lead Channel Impedance Value: 437 Ohm
Lead Channel Pacing Threshold Amplitude: 1.125 V
Lead Channel Pacing Threshold Pulse Width: 0.4 ms
Lead Channel Pacing Threshold Pulse Width: 0.4 ms
Lead Channel Sensing Intrinsic Amplitude: 1.5 mV
Lead Channel Sensing Intrinsic Amplitude: 1.5 mV
Lead Channel Setting Sensing Sensitivity: 0.3 mV
MDC IDC LEAD IMPLANT DT: 20090204
MDC IDC LEAD LOCATION: 753859
MDC IDC LEAD LOCATION: 753860
MDC IDC MSMT BATTERY VOLTAGE: 2.99 V
MDC IDC MSMT LEADCHNL RV IMPEDANCE VALUE: 437 Ohm
MDC IDC MSMT LEADCHNL RV PACING THRESHOLD AMPLITUDE: 0.875 V
MDC IDC MSMT LEADCHNL RV SENSING INTR AMPL: 3.625 mV
MDC IDC MSMT LEADCHNL RV SENSING INTR AMPL: 3.625 mV
MDC IDC SET LEADCHNL RA PACING AMPLITUDE: 2.25 V
MDC IDC SET LEADCHNL RV PACING AMPLITUDE: 2.5 V
MDC IDC SET LEADCHNL RV PACING PULSEWIDTH: 0.4 ms
MDC IDC STAT BRADY AP VP PERCENT: 0.22 %
MDC IDC STAT BRADY AS VS PERCENT: 0.09 %
MDC IDC STAT BRADY RV PERCENT PACED: 0.22 %

## 2018-06-18 ENCOUNTER — Ambulatory Visit (INDEPENDENT_AMBULATORY_CARE_PROVIDER_SITE_OTHER): Payer: Medicare Other

## 2018-06-18 DIAGNOSIS — I5022 Chronic systolic (congestive) heart failure: Secondary | ICD-10-CM | POA: Diagnosis not present

## 2018-06-18 DIAGNOSIS — Z95810 Presence of automatic (implantable) cardiac defibrillator: Secondary | ICD-10-CM | POA: Diagnosis not present

## 2018-06-18 NOTE — Progress Notes (Signed)
EPIC Encounter for ICM Monitoring  Patient Name: [name] is a 84 y.o. male Date: 06/18/2018 Primary Care Physican: Manon Hilding, MD Primary Cardiologist:Taylor Electrophysiologist: Druscilla Brownie Weight:Previous weight180lbs       Attempted call to patient and unable to reach. Transmission reviewed.    Thoracic impedance normal.  Prescribed dosage: Furosemide 20 mg 1 tablet as needed.  Recommendations:  NONE - Unable to reach.  Follow-up plan: ICM clinic phone appointment on 08/17/2018 since office defib check is scheduled with Dr Lovena Le on 07/13/2018.  Copy of ICM check sent to Dr. Lovena Le.   3 month ICM trend: 06/18/2018    1 Year ICM trend:       Rosalene Billings, RN 06/18/2018 9:57 AM

## 2018-07-02 DIAGNOSIS — H40013 Open angle with borderline findings, low risk, bilateral: Secondary | ICD-10-CM | POA: Diagnosis not present

## 2018-07-02 DIAGNOSIS — H04123 Dry eye syndrome of bilateral lacrimal glands: Secondary | ICD-10-CM | POA: Diagnosis not present

## 2018-07-02 DIAGNOSIS — H26492 Other secondary cataract, left eye: Secondary | ICD-10-CM | POA: Diagnosis not present

## 2018-07-02 DIAGNOSIS — Z961 Presence of intraocular lens: Secondary | ICD-10-CM | POA: Diagnosis not present

## 2018-07-02 DIAGNOSIS — H524 Presbyopia: Secondary | ICD-10-CM | POA: Diagnosis not present

## 2018-07-13 ENCOUNTER — Encounter: Payer: Self-pay | Admitting: Internal Medicine

## 2018-07-13 ENCOUNTER — Ambulatory Visit (INDEPENDENT_AMBULATORY_CARE_PROVIDER_SITE_OTHER): Payer: Medicare Other | Admitting: Internal Medicine

## 2018-07-13 VITALS — BP 126/84 | HR 73 | Ht 75.0 in | Wt 173.2 lb

## 2018-07-13 DIAGNOSIS — Z95810 Presence of automatic (implantable) cardiac defibrillator: Secondary | ICD-10-CM | POA: Diagnosis not present

## 2018-07-13 DIAGNOSIS — I472 Ventricular tachycardia, unspecified: Secondary | ICD-10-CM

## 2018-07-13 DIAGNOSIS — I5022 Chronic systolic (congestive) heart failure: Secondary | ICD-10-CM | POA: Diagnosis not present

## 2018-07-13 LAB — CUP PACEART INCLINIC DEVICE CHECK
Battery Remaining Longevity: 82 mo
Battery Voltage: 2.99 V
Brady Statistic AS VS Percent: 0.2 %
HighPow Impedance: 48 Ohm
HighPow Impedance: 59 Ohm
Implantable Lead Implant Date: 20090204
Implantable Lead Location: 753860
Implantable Lead Model: 6947
Implantable Pulse Generator Implant Date: 20161130
Lead Channel Impedance Value: 342 Ohm
Lead Channel Impedance Value: 456 Ohm
Lead Channel Pacing Threshold Amplitude: 1 V
Lead Channel Pacing Threshold Amplitude: 1 V
Lead Channel Sensing Intrinsic Amplitude: 1.5 mV
Lead Channel Sensing Intrinsic Amplitude: 4.5 mV
Lead Channel Setting Pacing Amplitude: 2 V
Lead Channel Setting Pacing Pulse Width: 0.4 ms
Lead Channel Setting Sensing Sensitivity: 0.3 mV
MDC IDC LEAD IMPLANT DT: 20090204
MDC IDC LEAD LOCATION: 753859
MDC IDC MSMT LEADCHNL RA IMPEDANCE VALUE: 456 Ohm
MDC IDC MSMT LEADCHNL RA PACING THRESHOLD PULSEWIDTH: 0.4 ms
MDC IDC MSMT LEADCHNL RV PACING THRESHOLD PULSEWIDTH: 0.4 ms
MDC IDC SESS DTM: 20190826101409
MDC IDC SET LEADCHNL RV PACING AMPLITUDE: 2.5 V
MDC IDC STAT BRADY AP VP PERCENT: 0.36 %
MDC IDC STAT BRADY AP VS PERCENT: 99.44 %
MDC IDC STAT BRADY AS VP PERCENT: 0 %
MDC IDC STAT BRADY RA PERCENT PACED: 99.79 %
MDC IDC STAT BRADY RV PERCENT PACED: 0.37 %

## 2018-07-13 NOTE — Progress Notes (Signed)
HPI [name] returns today for followup. He is a pleasant 84 yo man with chronic systolic heart failure, s/p ICD insertion, dense ventricular ectopy, on low dose amiodarone. He experienced and episode of VT with syncope and was cardioverted while awake and taken to the ED for evaluation. He feels well and denies any worsening of his CHF. He has not missed any meds although he does admit to some dietary indiscretion. His VT was below his device detection. His therapies were programmed to a lower rate. He has done well in the interim. Unfortunately his wife of 33 years has died. He raises a garden and remains active.  No Known Allergies   Current Outpatient Medications  Medication Sig Dispense Refill  . amiodarone (PACERONE) 200 MG tablet Take 200 mg by mouth daily.    . finasteride (PROSCAR) 5 MG tablet Take 1 tablet by mouth daily.    . furosemide (LASIX) 40 MG tablet Take 20 mg by mouth daily as needed (swelling). Dr. Quintin Alto (PCP) told him to take this only as needed    . levothyroxine (SYNTHROID, LEVOTHROID) 50 MCG tablet Take 50 mcg by mouth daily.      Marland Kitchen losartan (COZAAR) 25 MG tablet Take 25 mg by mouth daily.    . metoprolol tartrate (LOPRESSOR) 25 MG tablet Take 25 mg by mouth 2 (two) times daily.     Marland Kitchen omega-3 acid ethyl esters (LOVAZA) 1 G capsule Take 1 g by mouth 2 (two) times daily.    . Tamsulosin HCl (FLOMAX) 0.4 MG CAPS Take 0.4 mg by mouth daily.     No current facility-administered medications for this visit.      Past Medical History:  Diagnosis Date  . Automatic implantable cardiac defibrillator in situ    MDT, implanted 2009  . CAD (coronary artery disease)    minimal by cath  . CHF NYHA class II ([place])   . DCM (dilated cardiomyopathy) ([place])     ROS:   All systems reviewed and negative except as noted in the HPI.   Past Surgical History:  Procedure Laterality Date  . CARDIAC CATHETERIZATION  12/17/07  . EP IMPLANTABLE DEVICE N/A 10/18/2015   Procedure: ICD Generator Changeout;  Surgeon: Evans Lance, MD;  Location: [facility];  Service: Cardiovascular;  Laterality: N/A;  . S/P ICD  2009     Family History  Problem Relation Age of Onset  . Tuberculosis Father      Social History   Socioeconomic History  . Marital status: Married    Spouse name: Not on file  . Number of children: Not on file  . Years of education: Not on file  . Highest education level: Not on file  Occupational History  . Not on file  Social Needs  . Financial resource strain: Not on file  . Food insecurity:    Worry: Not on file    Inability: Not on file  . Transportation needs:    Medical: Not on file    Non-medical: Not on file  Tobacco Use  . Smoking status: Never Smoker  . Smokeless tobacco: Never Used  Substance and Sexual Activity  . Alcohol use: No  . Drug use: Not on file  . Sexual activity: Not on file  Lifestyle  . Physical activity:    Days per week: Not on file    Minutes per session: Not on file  . Stress: Not on file  Relationships  . Social  connections:    Talks on phone: Not on file    Gets together: Not on file    Attends religious service: Not on file    Active member of club or organization: Not on file    Attends meetings of clubs or organizations: Not on file    Relationship status: Not on file  . Intimate partner violence:    Fear of current or ex partner: Not on file    Emotionally abused: Not on file    Physically abused: Not on file    Forced sexual activity: Not on file  Other Topics Concern  . Not on file  Social History Narrative   Retired.      BP 126/84   Pulse 73   Ht 6\' 3"  (1.905 m)   Wt 173 lb 3.2 oz (78.6 kg)   SpO2 96%   BMI 21.65 kg/m   Physical Exam:  Well appearing NAD HEENT: Unremarkable Neck:  No JVD, no thyromegally Lymphatics:  No adenopathy Back:  No CVA tenderness Lungs:  Clear HEART:  Regular rate rhythm, no murmurs, no rubs, no clicks Abd:  soft, positive  bowel sounds, no organomegally, no rebound, no guarding Ext:  2 plus pulses, no edema, no cyanosis, no clubbing Skin:  No rashes no nodules Neuro:  CN II through XII intact, motor grossly intact  EKG - NSR with atrial pacing  DEVICE  Normal device function.  See PaceArt for details.   Assess/Plan: 1. VT - he has had no ventricular arrhythmias since his last visit. He is down to taking amio 200 mg daily. 2. Chronic systolic heart failure - he is class 1-2. He will continue his current meds. 3. ICD - his medtronic device is working normally. Will recheck in several months.  Mikle Bosworth.D.

## 2018-07-13 NOTE — Patient Instructions (Addendum)
Medication Instructions:  Your physician recommends that you continue on your current medications as directed. Please refer to the Current Medication list given to you today.  Labwork: None ordered.  Testing/Procedures: None ordered.  Follow-Up: Your physician wants you to follow-up in: 6 months with Dr. Lovena Le.   You will receive a reminder letter in the mail two months in advance. If you don't receive a letter, please call our office to schedule the follow-up appointment.  Remote monitoring is used to monitor your ICD from home. This monitoring reduces the number of office visits required to check your device to one time per year. It allows [place] to keep an eye on the functioning of your device to ensure it is working properly. You are scheduled for a device check from home on 08/17/2018. You may send your transmission at any time that day. If you have a wireless device, the transmission will be sent automatically. After your physician reviews your transmission, you will receive a postcard with your next transmission date.  Any Other Special Instructions Will Be Listed Below (If Applicable).  If you need a refill on your cardiac medications before your next appointment, please call your pharmacy.

## 2018-08-17 ENCOUNTER — Ambulatory Visit (INDEPENDENT_AMBULATORY_CARE_PROVIDER_SITE_OTHER): Payer: Medicare Other | Admitting: *Deleted

## 2018-08-17 ENCOUNTER — Ambulatory Visit (INDEPENDENT_AMBULATORY_CARE_PROVIDER_SITE_OTHER): Payer: Medicare Other

## 2018-08-17 DIAGNOSIS — I5022 Chronic systolic (congestive) heart failure: Secondary | ICD-10-CM | POA: Diagnosis not present

## 2018-08-17 DIAGNOSIS — I429 Cardiomyopathy, unspecified: Secondary | ICD-10-CM

## 2018-08-17 DIAGNOSIS — Z95810 Presence of automatic (implantable) cardiac defibrillator: Secondary | ICD-10-CM | POA: Diagnosis not present

## 2018-08-17 NOTE — Progress Notes (Signed)
Remote ICD transmission.   

## 2018-08-17 NOTE — Progress Notes (Signed)
EPIC Encounter for ICM Monitoring  Patient Name: [name] is a 84 y.o. male Date: 08/17/2018 Primary Care Physican: Manon Hilding, MD Primary Cardiologist:Taylor Electrophysiologist: Lovena Le Dry Weight:180lbs       Heart Failure questions reviewed, pt asymptomatic.  Wife passed away in 2018-04-24.  Were married 61 years   Thoracic impedance abnormal suggesting fluid accumulation starting 08/01/2018 with exception 1 day at baseline.  Prescribed: Furosemide 20 mg 1 tablet as needed.  Recommendations:  Advised to take PRN Furosemide 1 tablet x 2-3 days.   Follow-up plan: ICM clinic phone appointment on 08/20/2018 to recheck fluid levels.   Copy of ICM check sent to Dr. Lovena Le.   3 month ICM trend: 08/17/2018    1 Year ICM trend:       Rosalene Billings, RN 08/17/2018 12:04 PM

## 2018-08-19 LAB — CUP PACEART REMOTE DEVICE CHECK
Battery Remaining Longevity: 79 mo
Battery Voltage: 2.99 V
Brady Statistic AS VS Percent: 0.19 %
Brady Statistic RV Percent Paced: 0.44 %
HighPow Impedance: 41 Ohm
HighPow Impedance: 51 Ohm
Implantable Lead Implant Date: 20090204
Implantable Lead Location: 753860
Implantable Lead Model: 6947
Implantable Pulse Generator Implant Date: 20161130
Lead Channel Impedance Value: 380 Ohm
Lead Channel Pacing Threshold Amplitude: 1 V
Lead Channel Pacing Threshold Pulse Width: 0.4 ms
Lead Channel Sensing Intrinsic Amplitude: 1.375 mV
Lead Channel Sensing Intrinsic Amplitude: 3.75 mV
Lead Channel Setting Pacing Amplitude: 2 V
MDC IDC LEAD IMPLANT DT: 20090204
MDC IDC LEAD LOCATION: 753859
MDC IDC MSMT LEADCHNL RA IMPEDANCE VALUE: 494 Ohm
MDC IDC MSMT LEADCHNL RA SENSING INTR AMPL: 1.375 mV
MDC IDC MSMT LEADCHNL RV IMPEDANCE VALUE: 304 Ohm
MDC IDC MSMT LEADCHNL RV PACING THRESHOLD AMPLITUDE: 1 V
MDC IDC MSMT LEADCHNL RV PACING THRESHOLD PULSEWIDTH: 0.4 ms
MDC IDC MSMT LEADCHNL RV SENSING INTR AMPL: 3.75 mV
MDC IDC SESS DTM: 20190930041604
MDC IDC SET LEADCHNL RV PACING AMPLITUDE: 2.5 V
MDC IDC SET LEADCHNL RV PACING PULSEWIDTH: 0.4 ms
MDC IDC SET LEADCHNL RV SENSING SENSITIVITY: 0.3 mV
MDC IDC STAT BRADY AP VP PERCENT: 0.43 %
MDC IDC STAT BRADY AP VS PERCENT: 99.38 %
MDC IDC STAT BRADY AS VP PERCENT: 0 %
MDC IDC STAT BRADY RA PERCENT PACED: 99.81 %

## 2018-08-20 ENCOUNTER — Ambulatory Visit (INDEPENDENT_AMBULATORY_CARE_PROVIDER_SITE_OTHER): Payer: Medicare Other

## 2018-08-20 DIAGNOSIS — Z95810 Presence of automatic (implantable) cardiac defibrillator: Secondary | ICD-10-CM

## 2018-08-20 DIAGNOSIS — I5022 Chronic systolic (congestive) heart failure: Secondary | ICD-10-CM

## 2018-08-20 NOTE — Progress Notes (Signed)
EPIC Encounter for ICM Monitoring  Patient Name: [name] is a 84 y.o. male Date: 08/20/2018 Primary Care Physican: Manon Hilding, MD Primary Cardiologist:Taylor Electrophysiologist: Druscilla Brownie Weight:Last known weight180lbs        Heart Failure questions reviewed, pt asymptomatic.  He reported he feels fine. Does not weigh daily.   Thoracic impedance returned to normal after taking PRN Furosemide x 2 days.  Prescribed: Furosemide 20 mg 1 tablet as needed.  Recommendations:  No changes.  Encouraged to call for fluid symptoms.   Follow-up plan: ICM clinic phone appointment on 09/21/2018.    Copy of ICM check sent to Dr. Lovena Le.   3 month ICM trend: 08/20/2018    1 Year ICM trend:       Rosalene Billings, RN 08/20/2018 9:08 AM

## 2018-09-21 ENCOUNTER — Ambulatory Visit (INDEPENDENT_AMBULATORY_CARE_PROVIDER_SITE_OTHER): Payer: Medicare Other

## 2018-09-21 DIAGNOSIS — Z95810 Presence of automatic (implantable) cardiac defibrillator: Secondary | ICD-10-CM | POA: Diagnosis not present

## 2018-09-21 DIAGNOSIS — I5022 Chronic systolic (congestive) heart failure: Secondary | ICD-10-CM | POA: Diagnosis not present

## 2018-09-22 NOTE — Progress Notes (Signed)
EPIC Encounter for ICM Monitoring  Patient Name: [name] is a 84 y.o. male Date: 09/22/2018 Primary Care Physican: Manon Hilding, MD Primary Cardiologist:Taylor Electrophysiologist: Lovena Le Dry Weight:180lbs      Heart Failure questions reviewed, pt asymptomatic.   Thoracic impedance normal.   Prescribed: Furosemide 20 mg 1 tablet as needed.  Recommendations: No changes.  Encouraged to call for fluid symptoms.  Follow-up plan: ICM clinic phone appointment on 10/22/2018.    Copy of ICM check sent to Dr. Lovena Le.   3 month ICM trend: 09/21/2018    1 Year ICM trend:       Rosalene Billings, RN 09/22/2018 7:58 AM

## 2018-10-22 ENCOUNTER — Ambulatory Visit (INDEPENDENT_AMBULATORY_CARE_PROVIDER_SITE_OTHER): Payer: Medicare Other

## 2018-10-22 DIAGNOSIS — I5022 Chronic systolic (congestive) heart failure: Secondary | ICD-10-CM | POA: Diagnosis not present

## 2018-10-22 DIAGNOSIS — Z95810 Presence of automatic (implantable) cardiac defibrillator: Secondary | ICD-10-CM

## 2018-10-22 NOTE — Progress Notes (Signed)
EPIC Encounter for ICM Monitoring  Patient Name: [name] is a 84 y.o. male Date: 10/22/2018 Primary Care Physican: Manon Hilding, MD Primary Cardiologist:Taylor Electrophysiologist: Lovena Le Last Weight:180lbs      Today's Weight:  unknown       Attempted call to patient and unable to reach.   Transmission reviewed.    Thoracic impedance normal.   Prescribed: Furosemide 20 mg 1 tablet as needed.  Recommendations: Unable to reach.  Follow-up plan: ICM clinic phone appointment on 11/23/2018.    Copy of ICM check sent to Dr. Lovena Le.   3 month ICM trend: 10/22/2018    1 Year ICM trend:       Rosalene Billings, RN 10/22/2018 1:03 PM

## 2018-10-23 ENCOUNTER — Telehealth: Payer: Self-pay

## 2018-10-23 NOTE — Telephone Encounter (Signed)
Remote ICM transmission received.  Attempted call to patient regarding ICM remote transmission and no answer or answering machine. 

## 2018-10-27 DIAGNOSIS — N183 Chronic kidney disease, stage 3 (moderate): Secondary | ICD-10-CM | POA: Diagnosis not present

## 2018-10-27 DIAGNOSIS — E039 Hypothyroidism, unspecified: Secondary | ICD-10-CM | POA: Diagnosis not present

## 2018-10-27 DIAGNOSIS — I10 Essential (primary) hypertension: Secondary | ICD-10-CM | POA: Diagnosis not present

## 2018-10-27 DIAGNOSIS — R61 Generalized hyperhidrosis: Secondary | ICD-10-CM | POA: Diagnosis not present

## 2018-10-27 DIAGNOSIS — E782 Mixed hyperlipidemia: Secondary | ICD-10-CM | POA: Diagnosis not present

## 2018-10-29 DIAGNOSIS — I4891 Unspecified atrial fibrillation: Secondary | ICD-10-CM | POA: Diagnosis not present

## 2018-10-29 DIAGNOSIS — N401 Enlarged prostate with lower urinary tract symptoms: Secondary | ICD-10-CM | POA: Diagnosis not present

## 2018-10-29 DIAGNOSIS — Z6822 Body mass index (BMI) 22.0-22.9, adult: Secondary | ICD-10-CM | POA: Diagnosis not present

## 2018-10-29 DIAGNOSIS — I10 Essential (primary) hypertension: Secondary | ICD-10-CM | POA: Diagnosis not present

## 2018-10-29 DIAGNOSIS — Z23 Encounter for immunization: Secondary | ICD-10-CM | POA: Diagnosis not present

## 2018-10-29 DIAGNOSIS — I472 Ventricular tachycardia: Secondary | ICD-10-CM | POA: Diagnosis not present

## 2018-10-29 DIAGNOSIS — R2689 Other abnormalities of gait and mobility: Secondary | ICD-10-CM | POA: Diagnosis not present

## 2018-10-29 DIAGNOSIS — E782 Mixed hyperlipidemia: Secondary | ICD-10-CM | POA: Diagnosis not present

## 2018-11-23 ENCOUNTER — Ambulatory Visit (INDEPENDENT_AMBULATORY_CARE_PROVIDER_SITE_OTHER): Payer: Medicare Other

## 2018-11-23 DIAGNOSIS — Z95810 Presence of automatic (implantable) cardiac defibrillator: Secondary | ICD-10-CM

## 2018-11-23 DIAGNOSIS — I5022 Chronic systolic (congestive) heart failure: Secondary | ICD-10-CM

## 2018-11-23 DIAGNOSIS — I429 Cardiomyopathy, unspecified: Secondary | ICD-10-CM

## 2018-11-24 LAB — CUP PACEART REMOTE DEVICE CHECK
Brady Statistic AP VP Percent: 0.51 %
Brady Statistic AP VS Percent: 99.36 %
Brady Statistic AS VP Percent: 0 %
Brady Statistic AS VS Percent: 0.13 %
HighPow Impedance: 42 Ohm
HighPow Impedance: 50 Ohm
Implantable Lead Implant Date: 20090204
Implantable Lead Location: 753859
Implantable Lead Model: 5076
Lead Channel Pacing Threshold Amplitude: 0.875 V
Lead Channel Pacing Threshold Amplitude: 1 V
Lead Channel Pacing Threshold Pulse Width: 0.4 ms
Lead Channel Sensing Intrinsic Amplitude: 1.375 mV
Lead Channel Sensing Intrinsic Amplitude: 1.375 mV
Lead Channel Sensing Intrinsic Amplitude: 3.875 mV
Lead Channel Setting Pacing Amplitude: 2.5 V
Lead Channel Setting Pacing Pulse Width: 0.4 ms
MDC IDC LEAD IMPLANT DT: 20090204
MDC IDC LEAD LOCATION: 753860
MDC IDC MSMT BATTERY REMAINING LONGEVITY: 74 mo
MDC IDC MSMT BATTERY VOLTAGE: 2.98 V
MDC IDC MSMT LEADCHNL RA IMPEDANCE VALUE: 456 Ohm
MDC IDC MSMT LEADCHNL RA PACING THRESHOLD PULSEWIDTH: 0.4 ms
MDC IDC MSMT LEADCHNL RV IMPEDANCE VALUE: 304 Ohm
MDC IDC MSMT LEADCHNL RV IMPEDANCE VALUE: 380 Ohm
MDC IDC MSMT LEADCHNL RV SENSING INTR AMPL: 3.875 mV
MDC IDC PG IMPLANT DT: 20161130
MDC IDC SESS DTM: 20200106092505
MDC IDC SET LEADCHNL RA PACING AMPLITUDE: 2 V
MDC IDC SET LEADCHNL RV SENSING SENSITIVITY: 0.3 mV
MDC IDC STAT BRADY RA PERCENT PACED: 99.86 %
MDC IDC STAT BRADY RV PERCENT PACED: 0.52 %

## 2018-11-24 NOTE — Progress Notes (Signed)
Remote ICD transmission.   

## 2018-11-24 NOTE — Progress Notes (Signed)
EPIC Encounter for ICM Monitoring  Patient Name: [name] is a 85 y.o. male Date: 11/24/2018 Primary Care Physican: Manon Hilding, MD Primary Cardiologist:Taylor Electrophysiologist: Lovena Le Last Weight:180lbs Today's Weight:  unknown                                                   Transmission reviewed.    Thoracic impedance normal.   Prescribed: Furosemide 20 mg 1 tablet as needed.  Recommendations: None  Follow-up plan: ICM clinic phone appointment on 12/28/2018.  Office visit with Dr Lovena Le 01/01/2019.  Copy of ICM check sent to Dr. Lovena Le.   3 month ICM trend: 11/23/2018    1 Year ICM trend:       Rosalene Billings, RN 11/24/2018 9:00 AM

## 2018-12-11 ENCOUNTER — Encounter: Payer: Self-pay | Admitting: Internal Medicine

## 2018-12-28 ENCOUNTER — Ambulatory Visit (INDEPENDENT_AMBULATORY_CARE_PROVIDER_SITE_OTHER): Payer: Medicare Other

## 2018-12-28 DIAGNOSIS — I5022 Chronic systolic (congestive) heart failure: Secondary | ICD-10-CM | POA: Diagnosis not present

## 2018-12-28 DIAGNOSIS — Z95810 Presence of automatic (implantable) cardiac defibrillator: Secondary | ICD-10-CM

## 2018-12-28 NOTE — Progress Notes (Signed)
EPIC Encounter for ICM Monitoring  Patient Name: [name] is a 85 y.o. male Date: 12/28/2018 Primary Care Physican: Manon Hilding, MD Primary Cardiologist:Taylor Electrophysiologist: Lovena Le LastWeight:180lbs Today's Weight: 178 lbs   Heart failure questions reviewed.   Wife died in 02-06-18 and had been married 74 years.  He does his own cooking and admits to eating foods like bacon.  Thoracic impedance normal.   Prescribed:Furosemide 20 mg 1 tablet as needed.  He has taken twice in the last month.  Recommendations: Advised to limit salt intake.    Follow-up plan: ICM clinic phone appointment on3/17/2020. Office visit with Dr Lovena Le 01/01/2019.  Copy of ICM check sent to Dr.Taylor.  3 month ICM trend: 12/28/2018    1 Year ICM trend:       Rosalene Billings, RN 12/28/2018 10:28 AM

## 2019-01-01 ENCOUNTER — Encounter: Payer: Self-pay | Admitting: Internal Medicine

## 2019-01-01 ENCOUNTER — Ambulatory Visit (INDEPENDENT_AMBULATORY_CARE_PROVIDER_SITE_OTHER): Payer: Medicare Other | Admitting: Internal Medicine

## 2019-01-01 VITALS — BP 124/82 | HR 79 | Ht 75.0 in | Wt 192.0 lb

## 2019-01-01 DIAGNOSIS — N529 Male erectile dysfunction, unspecified: Secondary | ICD-10-CM | POA: Diagnosis not present

## 2019-01-01 DIAGNOSIS — Z95810 Presence of automatic (implantable) cardiac defibrillator: Secondary | ICD-10-CM

## 2019-01-01 DIAGNOSIS — I5022 Chronic systolic (congestive) heart failure: Secondary | ICD-10-CM

## 2019-01-01 DIAGNOSIS — I472 Ventricular tachycardia, unspecified: Secondary | ICD-10-CM

## 2019-01-01 LAB — BASIC METABOLIC PANEL
BUN / CREAT RATIO: 17 (ref 10–24)
BUN: 21 mg/dL (ref 8–27)
CO2: 23 mmol/L (ref 20–29)
CREATININE: 1.23 mg/dL (ref 0.76–1.27)
Calcium: 9.5 mg/dL (ref 8.6–10.2)
Chloride: 99 mmol/L (ref 96–106)
GFR, EST AFRICAN AMERICAN: 63 mL/min/{1.73_m2} (ref >59)
GFR, EST NON AFRICAN AMERICAN: 54 mL/min/{1.73_m2} — AB (ref >59)
Glucose: 92 mg/dL (ref 65–99)
Potassium: 4.9 mmol/L (ref 3.5–5.2)
SODIUM: 140 mmol/L (ref 134–144)

## 2019-01-01 MED ORDER — SILDENAFIL CITRATE 50 MG PO TABS: 50.0000 mg | ORAL_TABLET | Freq: Every day | ORAL | 1 refills | Status: DC | PRN

## 2019-01-01 NOTE — Progress Notes (Signed)
HPI Mr. Podgorski returns today for followup. He is a pleasant 85 yo man with chronic systolic heart failure, s/p ICD insertion, dense ventricular ectopy, on low dose amiodarone. He experienced and episode of VT with syncope and was cardioverted while awake and taken to the ED for evaluation. He feels well and denies any worsening of his CHF. He has not missed any meds although he does admit to some dietary indiscretion.His VT was below his device detection. His therapies were programmed to a lower rate. He has done well in the interim. He complains today of ED.  No Known Allergies   Current Outpatient Medications  Medication Sig Dispense Refill  . amiodarone (PACERONE) 200 MG tablet Take 200 mg by mouth daily.    . finasteride (PROSCAR) 5 MG tablet Take 1 tablet by mouth daily.    . furosemide (LASIX) 40 MG tablet Take 20 mg by mouth daily as needed (swelling). Dr. Quintin Alto (PCP) told him to take this only as needed    . levothyroxine (SYNTHROID, LEVOTHROID) 50 MCG tablet Take 50 mcg by mouth daily.      Marland Kitchen losartan (COZAAR) 25 MG tablet Take 25 mg by mouth daily.    . metoprolol tartrate (LOPRESSOR) 25 MG tablet Take 25 mg by mouth 2 (two) times daily.     Marland Kitchen omega-3 acid ethyl esters (LOVAZA) 1 G capsule Take 1 g by mouth 2 (two) times daily.    . Tamsulosin HCl (FLOMAX) 0.4 MG CAPS Take 0.4 mg by mouth daily.     No current facility-administered medications for this visit.      Past Medical History:  Diagnosis Date  . Automatic implantable cardiac defibrillator in situ    MDT, implanted 2009  . CAD (coronary artery disease)    minimal by cath  . CHF NYHA class II ([place])   . DCM (dilated cardiomyopathy) ([place])     ROS:   All systems reviewed and negative except as noted in the HPI.   Past Surgical History:  Procedure Laterality Date  . CARDIAC CATHETERIZATION  12/17/07  . EP IMPLANTABLE DEVICE N/A 10/18/2015   Procedure: ICD Generator Changeout;  Surgeon: Evans Lance,  MD;  Location: [facility];  Service: Cardiovascular;  Laterality: N/A;  . S/P ICD  2009     Family History  Problem Relation Age of Onset  . Tuberculosis Father      Social History   Socioeconomic History  . Marital status: Married    Spouse name: Not on file  . Number of children: Not on file  . Years of education: Not on file  . Highest education level: Not on file  Occupational History  . Not on file  Social Needs  . Financial resource strain: Not on file  . Food insecurity:    Worry: Not on file    Inability: Not on file  . Transportation needs:    Medical: Not on file    Non-medical: Not on file  Tobacco Use  . Smoking status: Never Smoker  . Smokeless tobacco: Never Used  Substance and Sexual Activity  . Alcohol use: No  . Drug use: Never  . Sexual activity: Not on file  Lifestyle  . Physical activity:    Days per week: Not on file    Minutes per session: Not on file  . Stress: Not on file  Relationships  . Social connections:    Talks on phone: Not on file  Gets together: Not on file    Attends religious service: Not on file    Active member of club or organization: Not on file    Attends meetings of clubs or organizations: Not on file    Relationship status: Not on file  . Intimate partner violence:    Fear of current or ex partner: Not on file    Emotionally abused: Not on file    Physically abused: Not on file    Forced sexual activity: Not on file  Other Topics Concern  . Not on file  Social History Narrative   Retired.      BP 124/82   Pulse 79   Ht 6\' 3"  (1.905 m)   Wt 192 lb (87.1 kg)   BMI 24.00 kg/m   Physical Exam:  Well appearing NAD HEENT: Unremarkable Neck:  No JVD, no thyromegally Lymphatics:  No adenopathy Back:  No CVA tenderness Lungs:  Clear HEART:  Regular rate rhythm, no murmurs, no rubs, no clicks Abd:  soft, positive bowel sounds, no organomegally, no rebound, no guarding Ext:  2 plus pulses, no  edema, no cyanosis, no clubbing Skin:  No rashes no nodules Neuro:  CN II through XII intact, motor grossly intact  EKG - nsr with atrial pacing  DEVICE  Normal device function.  See PaceArt for details.   Assess/Plan: 1. Chronic systolic heart failure - he has had no worsening. He will continue his current meds. 2. ED - this is his main complaint today. I have prescribed him viagra. 3. HTN - his blood pressure well controlled.  4. PVC's - his ectopy is well controlled on amiodarone. We will follow.  Mikle Bosworth.D.

## 2019-01-01 NOTE — Patient Instructions (Addendum)
Medication Instructions:  Your physician has recommended you make the following change in your medication:   1.  You will start Viagra (sildenafil) 50 mg-- Take one tablet by mouth 30-60 minutes prior to sexual activity  Labwork: You will get lab work today:  BMP  Testing/Procedures: None ordered.  Follow-Up: Your physician wants you to follow-up in: one year with Dr. Lovena Le.   You will receive a reminder letter in the mail two months in advance. If you don't receive a letter, please call our office to schedule the follow-up appointment.  Remote monitoring is used to monitor your ICD from home. This monitoring reduces the number of office visits required to check your device to one time per year. It allows [place] to keep an eye on the functioning of your device to ensure it is working properly. You are scheduled for a device check from home on 02/02/2019. You may send your transmission at any time that day. If you have a wireless device, the transmission will be sent automatically. After your physician reviews your transmission, you will receive a postcard with your next transmission date.  Any Other Special Instructions Will Be Listed Below (If Applicable).  If you need a refill on your cardiac medications before your next appointment, please call your pharmacy.   Sildenafil tablets (Erectile Dysfunction) What is this medicine? SILDENAFIL (sil DEN a fil) is used to treat erection problems in men. This medicine may be used for other purposes; ask your health care provider or pharmacist if you have questions. COMMON BRAND NAME(S): Viagra What should I tell my health care provider before I take this medicine? They need to know if you have any of these conditions: -bleeding disorders -eye or vision problems, including a rare inherited eye disease called retinitis pigmentosa -anatomical deformation of the penis, Peyronie's disease, or history of priapism (painful and prolonged erection) -heart  disease, angina, a history of heart attack, irregular heart beats, or other heart problems -high or low blood pressure -history of blood diseases, like sickle cell anemia or leukemia -history of stomach bleeding -kidney disease -liver disease -stroke -an unusual or allergic reaction to sildenafil, other medicines, foods, dyes, or preservatives -pregnant or trying to get pregnant -breast-feeding How should I use this medicine? Take this medicine by mouth with a glass of water. Follow the directions on the prescription label. The dose is usually taken 1 hour before sexual activity. You should not take the dose more than once per day. Do not take your medicine more often than directed. Talk to your pediatrician regarding the use of this medicine in children. This medicine is not used in children for this condition. Overdosage: If you think you have taken too much of this medicine contact a poison control center or emergency room at once. NOTE: This medicine is only for you. Do not share this medicine with others. What if I miss a dose? This does not apply. Do not take double or extra doses. What may interact with this medicine? Do not take this medicine with any of the following medications: -cisapride -nitrates like amyl nitrite, isosorbide dinitrate, isosorbide mononitrate, nitroglycerin -riociguat This medicine may also interact with the following medications: -antiviral medicines for HIV or AIDS -bosentan -certain medicines for benign prostatic hyperplasia (BPH) -certain medicines for blood pressure -certain medicines for fungal infections like ketoconazole and itraconazole -cimetidine -erythromycin -rifampin This list may not describe all possible interactions. Give your health care provider a list of all the medicines, herbs, non-prescription drugs, or  dietary supplements you use. Also tell them if you smoke, drink alcohol, or use illegal drugs. Some items may interact with your  medicine. What should I watch for while using this medicine? If you notice any changes in your vision while taking this drug, call your doctor or health care professional as soon as possible. Stop using this medicine and call your health care provider right away if you have a loss of sight in one or both eyes. Contact your doctor or health care professional right away if you have an erection that lasts longer than 4 hours or if it becomes painful. This may be a sign of a serious problem and must be treated right away to prevent permanent damage. If you experience symptoms of nausea, dizziness, chest pain or arm pain upon initiation of sexual activity after taking this medicine, you should refrain from further activity and call your doctor or health care professional as soon as possible. Do not drink alcohol to excess (examples, 5 glasses of wine or 5 shots of whiskey) when taking this medicine. When taken in excess, alcohol can increase your chances of getting a headache or getting dizzy, increasing your heart rate or lowering your blood pressure. Using this medicine does not protect you or your partner against HIV infection (the virus that causes AIDS) or other sexually transmitted diseases. What side effects may I notice from receiving this medicine? Side effects that you should report to your doctor or health care professional as soon as possible: -allergic reactions like skin rash, itching or hives, swelling of the face, lips, or tongue -breathing problems -changes in hearing -changes in vision -chest pain -fast, irregular heartbeat -prolonged or painful erection -seizures Side effects that usually do not require medical attention (report to your doctor or health care professional if they continue or are bothersome): -back pain -dizziness -flushing -headache -indigestion -muscle aches -nausea -stuffy or runny nose This list may not describe all possible side effects. Call your doctor  for medical advice about side effects. You may report side effects to FDA at 1-800-FDA-1088. Where should I keep my medicine? Keep out of reach of children. Store at room temperature between 15 and 30 degrees C (59 and 86 degrees F). Throw away any unused medicine after the expiration date. NOTE: This sheet is a summary. It may not cover all possible information. If you have questions about this medicine, talk to your doctor, pharmacist, or health care provider.  2019 Elsevier/Gold Standard (2015-10-18 12:00:25)

## 2019-01-05 LAB — CUP PACEART INCLINIC DEVICE CHECK
Battery Remaining Longevity: 73 mo
Brady Statistic AP VP Percent: 0.35 %
Brady Statistic AP VS Percent: 99.51 %
Brady Statistic AS VS Percent: 0.14 %
Brady Statistic RA Percent Paced: 99.85 %
Brady Statistic RV Percent Paced: 0.36 %
Date Time Interrogation Session: 20200214150600
HIGH POWER IMPEDANCE MEASURED VALUE: 66 Ohm
HighPow Impedance: 49 Ohm
Implantable Lead Implant Date: 20090204
Implantable Lead Implant Date: 20090204
Implantable Lead Location: 753859
Implantable Pulse Generator Implant Date: 20161130
Lead Channel Impedance Value: 380 Ohm
Lead Channel Pacing Threshold Pulse Width: 0.4 ms
Lead Channel Sensing Intrinsic Amplitude: 1.75 mV
Lead Channel Sensing Intrinsic Amplitude: 3.875 mV
Lead Channel Sensing Intrinsic Amplitude: 5 mV
Lead Channel Setting Pacing Amplitude: 2 V
Lead Channel Setting Pacing Amplitude: 2.5 V
Lead Channel Setting Pacing Pulse Width: 0.4 ms
Lead Channel Setting Sensing Sensitivity: 0.3 mV
MDC IDC LEAD LOCATION: 753860
MDC IDC MSMT BATTERY VOLTAGE: 2.98 V
MDC IDC MSMT LEADCHNL RA IMPEDANCE VALUE: 437 Ohm
MDC IDC MSMT LEADCHNL RA PACING THRESHOLD AMPLITUDE: 0.875 V
MDC IDC MSMT LEADCHNL RA PACING THRESHOLD PULSEWIDTH: 0.4 ms
MDC IDC MSMT LEADCHNL RA SENSING INTR AMPL: 1.375 mV
MDC IDC MSMT LEADCHNL RV IMPEDANCE VALUE: 456 Ohm
MDC IDC MSMT LEADCHNL RV PACING THRESHOLD AMPLITUDE: 0.875 V
MDC IDC STAT BRADY AS VP PERCENT: 0 %

## 2019-01-07 DIAGNOSIS — Z961 Presence of intraocular lens: Secondary | ICD-10-CM | POA: Diagnosis not present

## 2019-01-07 DIAGNOSIS — H40013 Open angle with borderline findings, low risk, bilateral: Secondary | ICD-10-CM | POA: Diagnosis not present

## 2019-01-07 DIAGNOSIS — H26492 Other secondary cataract, left eye: Secondary | ICD-10-CM | POA: Diagnosis not present

## 2019-01-07 DIAGNOSIS — H04123 Dry eye syndrome of bilateral lacrimal glands: Secondary | ICD-10-CM | POA: Diagnosis not present

## 2019-02-02 ENCOUNTER — Other Ambulatory Visit: Payer: Self-pay

## 2019-02-02 ENCOUNTER — Ambulatory Visit (INDEPENDENT_AMBULATORY_CARE_PROVIDER_SITE_OTHER): Payer: Medicare Other

## 2019-02-02 DIAGNOSIS — I5022 Chronic systolic (congestive) heart failure: Secondary | ICD-10-CM

## 2019-02-02 DIAGNOSIS — Z95810 Presence of automatic (implantable) cardiac defibrillator: Secondary | ICD-10-CM

## 2019-02-03 DIAGNOSIS — Z95810 Presence of automatic (implantable) cardiac defibrillator: Secondary | ICD-10-CM | POA: Diagnosis not present

## 2019-02-03 DIAGNOSIS — I5022 Chronic systolic (congestive) heart failure: Secondary | ICD-10-CM

## 2019-02-03 NOTE — Progress Notes (Signed)
EPIC Encounter for ICM Monitoring  Patient Name: [name] is a 85 y.o. male Date: 02/03/2019 Primary Care Physican: Manon Hilding, MD Primary Cardiologist:Taylor Electrophysiologist: Lovena Le 1/06/2020Weight:180lbs 12/28/2018 Weight: 178 lbs 02/03/2019 Weight: 179 lbs   Heart failure questions reviewed.  Pt is asymptomatic.  Wife died in 2018/02/02 and had been married 3 years.    Thoracic impedance normal.   Prescribed:Furosemide 20 mg 1 tablet as needed.   Recommendations: No changes and encouraged him to call for fluid symptoms.    Follow-up plan: ICM clinic phone appointment on4/20/2020.  Copy of ICM check sent to Dr.Taylor.  3 month ICM trend: 02/02/2019    1 Year ICM trend:       Rosalene Billings, RN 02/03/2019 11:57 AM

## 2019-02-22 ENCOUNTER — Ambulatory Visit (INDEPENDENT_AMBULATORY_CARE_PROVIDER_SITE_OTHER): Payer: Medicare Other | Admitting: *Deleted

## 2019-02-22 ENCOUNTER — Other Ambulatory Visit: Payer: Self-pay

## 2019-02-22 DIAGNOSIS — I5022 Chronic systolic (congestive) heart failure: Secondary | ICD-10-CM

## 2019-02-22 LAB — CUP PACEART REMOTE DEVICE CHECK
Battery Remaining Longevity: 66 mo
Battery Voltage: 2.98 V
Brady Statistic AP VP Percent: 0.37 %
Brady Statistic AP VS Percent: 99.55 %
Brady Statistic AS VP Percent: 0 %
Brady Statistic AS VS Percent: 0.08 %
Brady Statistic RA Percent Paced: 99.92 %
Brady Statistic RV Percent Paced: 0.37 %
Date Time Interrogation Session: 20200406041803
HighPow Impedance: 44 Ohm
HighPow Impedance: 55 Ohm
Implantable Lead Implant Date: 20090204
Implantable Lead Implant Date: 20090204
Implantable Lead Location: 753859
Implantable Lead Location: 753860
Implantable Lead Model: 5076
Implantable Lead Model: 6947
Implantable Pulse Generator Implant Date: 20161130
Lead Channel Impedance Value: 304 Ohm
Lead Channel Impedance Value: 380 Ohm
Lead Channel Impedance Value: 437 Ohm
Lead Channel Pacing Threshold Amplitude: 0.875 V
Lead Channel Pacing Threshold Amplitude: 1 V
Lead Channel Pacing Threshold Pulse Width: 0.4 ms
Lead Channel Pacing Threshold Pulse Width: 0.4 ms
Lead Channel Sensing Intrinsic Amplitude: 1.875 mV
Lead Channel Sensing Intrinsic Amplitude: 1.875 mV
Lead Channel Sensing Intrinsic Amplitude: 3.375 mV
Lead Channel Sensing Intrinsic Amplitude: 3.375 mV
Lead Channel Setting Pacing Amplitude: 2.25 V
Lead Channel Setting Pacing Amplitude: 2.5 V
Lead Channel Setting Pacing Pulse Width: 0.4 ms
Lead Channel Setting Sensing Sensitivity: 0.3 mV

## 2019-03-02 ENCOUNTER — Encounter: Payer: Self-pay | Admitting: Cardiology

## 2019-03-02 NOTE — Progress Notes (Signed)
Remote ICD transmission.   

## 2019-03-08 ENCOUNTER — Ambulatory Visit (INDEPENDENT_AMBULATORY_CARE_PROVIDER_SITE_OTHER): Payer: Medicare Other

## 2019-03-08 ENCOUNTER — Other Ambulatory Visit: Payer: Self-pay

## 2019-03-08 DIAGNOSIS — I5022 Chronic systolic (congestive) heart failure: Secondary | ICD-10-CM | POA: Diagnosis not present

## 2019-03-08 DIAGNOSIS — Z95810 Presence of automatic (implantable) cardiac defibrillator: Secondary | ICD-10-CM | POA: Diagnosis not present

## 2019-03-08 NOTE — Progress Notes (Signed)
EPIC Encounter for ICM Monitoring  Patient Name: [name] is a 85 y.o. male Date: 03/08/2019 Primary Care Physican: Manon Hilding, MD Primary Cardiologist:Taylor Electrophysiologist: Lovena Le 02/03/2019 Weight: 179 lbs 03/08/2019 Weight: 180 lbs   Heart failure questions reviewed.  Pt is asymptomatic.Wife died in 2018-02-25 and had been married 75 years.   Thoracic impedance normal.   Prescribed:Furosemide 20 mg 1 tablet as needed.  Recommendations: No changes and encouraged him to call for fluid symptoms.  Follow-up plan: ICM clinic phone appointment on5/26/2020.  Copy of ICM check sent to Dr.Taylor.  3 month ICM trend: 03/08/2019    1 Year ICM trend:       Rosalene Billings, RN 03/08/2019 2:52 PM

## 2019-04-13 ENCOUNTER — Other Ambulatory Visit: Payer: Self-pay

## 2019-04-13 ENCOUNTER — Ambulatory Visit (INDEPENDENT_AMBULATORY_CARE_PROVIDER_SITE_OTHER): Payer: Medicare Other

## 2019-04-13 DIAGNOSIS — I5022 Chronic systolic (congestive) heart failure: Secondary | ICD-10-CM

## 2019-04-13 DIAGNOSIS — Z95810 Presence of automatic (implantable) cardiac defibrillator: Secondary | ICD-10-CM

## 2019-04-14 ENCOUNTER — Telehealth: Payer: Self-pay

## 2019-04-14 NOTE — Progress Notes (Signed)
EPIC Encounter for ICM Monitoring  Patient Name: TARUS BRISKI is a 85 y.o. male Date: 04/14/2019 Primary Care Physican: Manon Hilding, MD Primary Cardiologist:Taylor Electrophysiologist: Lovena Le 03/08/2019 Weight: 180 lbs   Attempted call to patient and unable to reach.   Transmission reviewed. Wife died in February 17, 2018 and had been married 37 years.   Thoracic impedance normal.   Prescribed:Furosemide 20 mg 1 tablet as needed.  Recommendations: Unable to reach.  Follow-up plan: ICM clinic phone appointment on6/29/2020.  Copy of ICM check sent to Dr.Taylor.   3 month ICM trend: 04/13/2019    1 Year ICM trend:       Rosalene Billings, RN 04/14/2019 9:45 AM

## 2019-04-14 NOTE — Telephone Encounter (Signed)
Remote ICM transmission received.  Attempted call to patient regarding ICM remote transmission and no answer.  

## 2019-04-26 DIAGNOSIS — I10 Essential (primary) hypertension: Secondary | ICD-10-CM | POA: Diagnosis not present

## 2019-04-26 DIAGNOSIS — E039 Hypothyroidism, unspecified: Secondary | ICD-10-CM | POA: Diagnosis not present

## 2019-04-26 DIAGNOSIS — E782 Mixed hyperlipidemia: Secondary | ICD-10-CM | POA: Diagnosis not present

## 2019-04-29 DIAGNOSIS — I4891 Unspecified atrial fibrillation: Secondary | ICD-10-CM | POA: Diagnosis not present

## 2019-04-29 DIAGNOSIS — N183 Chronic kidney disease, stage 3 (moderate): Secondary | ICD-10-CM | POA: Diagnosis not present

## 2019-04-29 DIAGNOSIS — I10 Essential (primary) hypertension: Secondary | ICD-10-CM | POA: Diagnosis not present

## 2019-04-29 DIAGNOSIS — R2689 Other abnormalities of gait and mobility: Secondary | ICD-10-CM | POA: Diagnosis not present

## 2019-04-29 DIAGNOSIS — N401 Enlarged prostate with lower urinary tract symptoms: Secondary | ICD-10-CM | POA: Diagnosis not present

## 2019-04-29 DIAGNOSIS — E782 Mixed hyperlipidemia: Secondary | ICD-10-CM | POA: Diagnosis not present

## 2019-04-29 DIAGNOSIS — I472 Ventricular tachycardia: Secondary | ICD-10-CM | POA: Diagnosis not present

## 2019-04-29 DIAGNOSIS — E039 Hypothyroidism, unspecified: Secondary | ICD-10-CM | POA: Diagnosis not present

## 2019-05-17 ENCOUNTER — Ambulatory Visit (INDEPENDENT_AMBULATORY_CARE_PROVIDER_SITE_OTHER): Payer: Medicare Other

## 2019-05-17 DIAGNOSIS — Z95810 Presence of automatic (implantable) cardiac defibrillator: Secondary | ICD-10-CM

## 2019-05-17 DIAGNOSIS — I5022 Chronic systolic (congestive) heart failure: Secondary | ICD-10-CM

## 2019-05-19 NOTE — Progress Notes (Addendum)
EPIC Encounter for ICM Monitoring  Patient Name: [name] is a 85 y.o. male Date: 05/19/2019 Primary Care Physican: Manon Hilding, MD Primary Cardiologist:Taylor Electrophysiologist: Lovena Le 03/08/2019 Weight:180lbs   Transmission reviewed.   Thoracic impedance normal.   Prescribed:Furosemide 20 mg 1 tablet as needed.  Recommendations: None.  Follow-up plan: ICM clinic phone appointment on 06/21/2019.  Copy of ICM check sent to Dr.Taylor.   3 month ICM trend: 05/17/2019    1 Year ICM trend:       Rosalene Billings, RN 05/19/2019 10:42 AM

## 2019-05-24 ENCOUNTER — Ambulatory Visit (INDEPENDENT_AMBULATORY_CARE_PROVIDER_SITE_OTHER): Payer: Medicare Other | Admitting: *Deleted

## 2019-05-24 DIAGNOSIS — I429 Cardiomyopathy, unspecified: Secondary | ICD-10-CM | POA: Diagnosis not present

## 2019-05-24 LAB — CUP PACEART REMOTE DEVICE CHECK
Battery Remaining Longevity: 60 mo
Battery Voltage: 2.97 V
Brady Statistic AP VP Percent: 0.66 %
Brady Statistic AP VS Percent: 99.24 %
Brady Statistic AS VP Percent: 0.01 %
Brady Statistic AS VS Percent: 0.09 %
Brady Statistic RA Percent Paced: 99.83 %
Brady Statistic RV Percent Paced: 0.71 %
Date Time Interrogation Session: 20200706052504
HighPow Impedance: 40 Ohm
HighPow Impedance: 52 Ohm
Implantable Lead Implant Date: 20090204
Implantable Lead Implant Date: 20090204
Implantable Lead Location: 753859
Implantable Lead Location: 753860
Implantable Lead Model: 5076
Implantable Lead Model: 6947
Implantable Pulse Generator Implant Date: 20161130
Lead Channel Impedance Value: 304 Ohm
Lead Channel Impedance Value: 380 Ohm
Lead Channel Impedance Value: 399 Ohm
Lead Channel Pacing Threshold Amplitude: 0.875 V
Lead Channel Pacing Threshold Amplitude: 0.875 V
Lead Channel Pacing Threshold Pulse Width: 0.4 ms
Lead Channel Pacing Threshold Pulse Width: 0.4 ms
Lead Channel Sensing Intrinsic Amplitude: 1.375 mV
Lead Channel Sensing Intrinsic Amplitude: 1.375 mV
Lead Channel Sensing Intrinsic Amplitude: 4.25 mV
Lead Channel Sensing Intrinsic Amplitude: 4.25 mV
Lead Channel Setting Pacing Amplitude: 2 V
Lead Channel Setting Pacing Amplitude: 2.5 V
Lead Channel Setting Pacing Pulse Width: 0.4 ms
Lead Channel Setting Sensing Sensitivity: 0.3 mV

## 2019-05-31 ENCOUNTER — Encounter: Payer: Self-pay | Admitting: Cardiology

## 2019-05-31 NOTE — Progress Notes (Signed)
Remote ICD transmission.   

## 2019-06-07 ENCOUNTER — Other Ambulatory Visit: Payer: Self-pay | Admitting: Internal Medicine

## 2019-06-21 ENCOUNTER — Ambulatory Visit (INDEPENDENT_AMBULATORY_CARE_PROVIDER_SITE_OTHER): Payer: Medicare Other

## 2019-06-21 DIAGNOSIS — I5022 Chronic systolic (congestive) heart failure: Secondary | ICD-10-CM | POA: Diagnosis not present

## 2019-06-21 DIAGNOSIS — Z95810 Presence of automatic (implantable) cardiac defibrillator: Secondary | ICD-10-CM | POA: Diagnosis not present

## 2019-06-22 ENCOUNTER — Telehealth: Payer: Self-pay

## 2019-06-22 NOTE — Progress Notes (Signed)
EPIC Encounter for ICM Monitoring  Patient Name: [name] is a 85 y.o. male Date: 06/22/2019 Primary Care Physican: Manon Hilding, MD Primary Cardiologist:Taylor Electrophysiologist: Lovena Le 03/08/2019 Weight:180lbs 06/22/2019 Weight: unknown   Attempted call to patient and unable to reach.   Transmission reviewed.   Optivol thoracic impedance slightly below baseline normal.   Prescribed:Furosemide 20 mg 1 tablet as needed.  Recommendations: Unable to reach.    Follow-up plan: ICM clinic phone appointment on 07/22/2019.  Copy of ICM check sent to Dr.Taylor.  3 month ICM trend: 06/21/2019    1 Year ICM trend:       Rosalene Billings, RN 06/22/2019 9:33 AM

## 2019-06-22 NOTE — Telephone Encounter (Signed)
Remote ICM transmission received.  Attempted call to patient regarding ICM remote transmission and no answer.  

## 2019-07-22 ENCOUNTER — Ambulatory Visit (INDEPENDENT_AMBULATORY_CARE_PROVIDER_SITE_OTHER): Payer: Medicare Other

## 2019-07-22 DIAGNOSIS — I5022 Chronic systolic (congestive) heart failure: Secondary | ICD-10-CM | POA: Diagnosis not present

## 2019-07-22 DIAGNOSIS — Z95810 Presence of automatic (implantable) cardiac defibrillator: Secondary | ICD-10-CM

## 2019-07-23 NOTE — Progress Notes (Signed)
EPIC Encounter for ICM Monitoring  Patient Name: [name] is a 85 y.o. male Date: 07/23/2019 Primary Care Physican: Manon Hilding, MD Primary Cardiologist:Taylor Electrophysiologist: Lovena Le 06/22/2019 Weight: unknown   Spoke with patient.  He said he thought he may have a little fluid and will take PRN Furosemide in the morning.   Optivol thoracic impedance slightly below baseline normal.   Prescribed:Furosemide 20 mg 1 tablet as needed.  Recommendations:  Advised to take 1 PRN Furosemide tablet.   Follow-up plan: ICM clinic phone appointment on 08/24/2019 and 91 device clinic remote transmission scheduled 08/23/2019.  Copy of ICM check sent to Dr.Taylor.  3 month ICM trend: 07/22/2019    1 Year ICM trend:       Rosalene Billings, RN 07/23/2019 4:02 PM

## 2019-07-24 DIAGNOSIS — R42 Dizziness and giddiness: Secondary | ICD-10-CM | POA: Diagnosis not present

## 2019-07-24 DIAGNOSIS — R55 Syncope and collapse: Secondary | ICD-10-CM | POA: Diagnosis not present

## 2019-07-28 DIAGNOSIS — E039 Hypothyroidism, unspecified: Secondary | ICD-10-CM | POA: Diagnosis not present

## 2019-07-28 DIAGNOSIS — R55 Syncope and collapse: Secondary | ICD-10-CM | POA: Diagnosis not present

## 2019-07-28 DIAGNOSIS — N183 Chronic kidney disease, stage 3 (moderate): Secondary | ICD-10-CM | POA: Diagnosis not present

## 2019-07-28 DIAGNOSIS — I951 Orthostatic hypotension: Secondary | ICD-10-CM | POA: Diagnosis not present

## 2019-07-28 DIAGNOSIS — I10 Essential (primary) hypertension: Secondary | ICD-10-CM | POA: Diagnosis not present

## 2019-07-28 DIAGNOSIS — N401 Enlarged prostate with lower urinary tract symptoms: Secondary | ICD-10-CM | POA: Diagnosis not present

## 2019-07-28 DIAGNOSIS — Z23 Encounter for immunization: Secondary | ICD-10-CM | POA: Diagnosis not present

## 2019-07-28 DIAGNOSIS — Z6822 Body mass index (BMI) 22.0-22.9, adult: Secondary | ICD-10-CM | POA: Diagnosis not present

## 2019-08-23 ENCOUNTER — Ambulatory Visit (INDEPENDENT_AMBULATORY_CARE_PROVIDER_SITE_OTHER): Payer: Medicare Other | Admitting: *Deleted

## 2019-08-23 DIAGNOSIS — I5022 Chronic systolic (congestive) heart failure: Secondary | ICD-10-CM | POA: Diagnosis not present

## 2019-08-24 ENCOUNTER — Ambulatory Visit (INDEPENDENT_AMBULATORY_CARE_PROVIDER_SITE_OTHER): Payer: Medicare Other

## 2019-08-24 DIAGNOSIS — I5022 Chronic systolic (congestive) heart failure: Secondary | ICD-10-CM | POA: Diagnosis not present

## 2019-08-24 DIAGNOSIS — Z95810 Presence of automatic (implantable) cardiac defibrillator: Secondary | ICD-10-CM | POA: Diagnosis not present

## 2019-08-24 LAB — CUP PACEART REMOTE DEVICE CHECK
Battery Remaining Longevity: 58 mo
Battery Voltage: 2.98 V
Brady Statistic AP VP Percent: 0.56 %
Brady Statistic AP VS Percent: 99.11 %
Brady Statistic AS VP Percent: 0.01 %
Brady Statistic AS VS Percent: 0.32 %
Brady Statistic RA Percent Paced: 99.64 %
Brady Statistic RV Percent Paced: 0.59 %
Date Time Interrogation Session: 20201005062823
HighPow Impedance: 43 Ohm
HighPow Impedance: 54 Ohm
Implantable Lead Implant Date: 20090204
Implantable Lead Implant Date: 20090204
Implantable Lead Location: 753859
Implantable Lead Location: 753860
Implantable Lead Model: 5076
Implantable Lead Model: 6947
Implantable Pulse Generator Implant Date: 20161130
Lead Channel Impedance Value: 323 Ohm
Lead Channel Impedance Value: 399 Ohm
Lead Channel Impedance Value: 437 Ohm
Lead Channel Pacing Threshold Amplitude: 0.875 V
Lead Channel Pacing Threshold Amplitude: 1 V
Lead Channel Pacing Threshold Pulse Width: 0.4 ms
Lead Channel Pacing Threshold Pulse Width: 0.4 ms
Lead Channel Sensing Intrinsic Amplitude: 1.375 mV
Lead Channel Sensing Intrinsic Amplitude: 1.375 mV
Lead Channel Sensing Intrinsic Amplitude: 3.875 mV
Lead Channel Sensing Intrinsic Amplitude: 3.875 mV
Lead Channel Setting Pacing Amplitude: 2 V
Lead Channel Setting Pacing Amplitude: 2.5 V
Lead Channel Setting Pacing Pulse Width: 0.4 ms
Lead Channel Setting Sensing Sensitivity: 0.3 mV

## 2019-08-27 NOTE — Progress Notes (Signed)
EPIC Encounter for ICM Monitoring  Patient Name: [name] is a 85 y.o. male Date: 08/27/2019 Primary Care Physican: Manon Hilding, MD Primary Cardiologist:Taylor Electrophysiologist: Lovena Le 06/22/2019 Weight:unknown   Transmission reviewed.  Optivol thoracic impedanceslightly below baselinenormal.   Prescribed:Furosemide 20 mg 1 tablet as needed.  Recommendations: None  Follow-up plan: ICM clinic phone appointment on 09/27/2019.   91 day device clinic remote transmission 11/22/2019.    Copy of ICM check sent to Dr. Lovena Le.   3 month ICM trend: 08/23/2019    1 Year ICM trend:       Rosalene Billings, RN 08/27/2019 2:57 PM

## 2019-08-30 NOTE — Progress Notes (Signed)
Remote ICD transmission.   

## 2019-09-17 DIAGNOSIS — E782 Mixed hyperlipidemia: Secondary | ICD-10-CM | POA: Diagnosis not present

## 2019-09-17 DIAGNOSIS — I10 Essential (primary) hypertension: Secondary | ICD-10-CM | POA: Diagnosis not present

## 2019-09-27 ENCOUNTER — Ambulatory Visit (INDEPENDENT_AMBULATORY_CARE_PROVIDER_SITE_OTHER): Payer: Medicare Other

## 2019-09-27 DIAGNOSIS — I5022 Chronic systolic (congestive) heart failure: Secondary | ICD-10-CM

## 2019-09-27 DIAGNOSIS — Z95810 Presence of automatic (implantable) cardiac defibrillator: Secondary | ICD-10-CM | POA: Diagnosis not present

## 2019-09-28 ENCOUNTER — Telehealth: Payer: Self-pay

## 2019-09-28 NOTE — Progress Notes (Signed)
EPIC Encounter for ICM Monitoring  Patient Name: [name] is a 85 y.o. male Date: 09/28/2019 Primary Care Physican: Manon Hilding, MD Primary Cardiologist:Taylor Electrophysiologist: Lovena Le 06/22/2019 Weight:unknown   Attempted call to patient and unable to reach.  Transmission reviewed.   Optivol thoracic impedanceslightly below baselinenormal.   Prescribed:Furosemide 20 mg 1 tablet as needed.  Recommendations: Unable to reach.    Follow-up plan: ICM clinic phone appointment on 11/01/2019.   91 day device clinic remote transmission 11/22/2019.     Copy of ICM check sent to Dr. Lovena Le.   3 month ICM trend: 09/27/2019    1 Year ICM trend:       Rosalene Billings, RN 09/28/2019 3:19 PM

## 2019-09-28 NOTE — Telephone Encounter (Signed)
Remote ICM transmission received.  Attempted call to patient regarding ICM remote transmission and no answer or voice mail box.

## 2019-10-18 DIAGNOSIS — N183 Chronic kidney disease, stage 3 unspecified: Secondary | ICD-10-CM | POA: Diagnosis not present

## 2019-10-18 DIAGNOSIS — I10 Essential (primary) hypertension: Secondary | ICD-10-CM | POA: Diagnosis not present

## 2019-10-21 DIAGNOSIS — I10 Essential (primary) hypertension: Secondary | ICD-10-CM | POA: Diagnosis not present

## 2019-10-21 DIAGNOSIS — E782 Mixed hyperlipidemia: Secondary | ICD-10-CM | POA: Diagnosis not present

## 2019-10-21 DIAGNOSIS — E039 Hypothyroidism, unspecified: Secondary | ICD-10-CM | POA: Diagnosis not present

## 2019-10-21 DIAGNOSIS — N189 Chronic kidney disease, unspecified: Secondary | ICD-10-CM | POA: Diagnosis not present

## 2019-10-27 DIAGNOSIS — Z23 Encounter for immunization: Secondary | ICD-10-CM | POA: Diagnosis not present

## 2019-10-27 DIAGNOSIS — I10 Essential (primary) hypertension: Secondary | ICD-10-CM | POA: Diagnosis not present

## 2019-10-27 DIAGNOSIS — E039 Hypothyroidism, unspecified: Secondary | ICD-10-CM | POA: Diagnosis not present

## 2019-10-27 DIAGNOSIS — Z6822 Body mass index (BMI) 22.0-22.9, adult: Secondary | ICD-10-CM | POA: Diagnosis not present

## 2019-10-27 DIAGNOSIS — I472 Ventricular tachycardia: Secondary | ICD-10-CM | POA: Diagnosis not present

## 2019-10-27 DIAGNOSIS — N401 Enlarged prostate with lower urinary tract symptoms: Secondary | ICD-10-CM | POA: Diagnosis not present

## 2019-11-01 ENCOUNTER — Ambulatory Visit (INDEPENDENT_AMBULATORY_CARE_PROVIDER_SITE_OTHER): Payer: Medicare Other

## 2019-11-01 DIAGNOSIS — Z95810 Presence of automatic (implantable) cardiac defibrillator: Secondary | ICD-10-CM

## 2019-11-01 DIAGNOSIS — I5022 Chronic systolic (congestive) heart failure: Secondary | ICD-10-CM | POA: Diagnosis not present

## 2019-11-02 ENCOUNTER — Other Ambulatory Visit: Payer: Self-pay | Admitting: Internal Medicine

## 2019-11-02 NOTE — Progress Notes (Signed)
EPIC Encounter for ICM Monitoring  Patient Name: [name] is a 85 y.o. male Date: 11/02/2019 Primary Care Physican: Manon Hilding, MD Primary Cardiologist:Taylor Electrophysiologist: Lovena Le 06/22/2019 Weight:unknown   Transmission reviewed.   Optivol thoracic impedancenormal.   Prescribed:Furosemide 20 mg 1 tablet as needed.  Recommendations: None  Follow-up plan: ICM clinic phone appointment on 12/06/2019.   91 day device clinic remote transmission 11/22/2019.     Copy of ICM check sent to Dr. Lovena Le.    3 month ICM trend: 11/01/2019    1 Year ICM trend:       Rosalene Billings, RN 11/02/2019 3:02 PM

## 2019-11-22 ENCOUNTER — Ambulatory Visit (INDEPENDENT_AMBULATORY_CARE_PROVIDER_SITE_OTHER): Payer: Medicare Other | Admitting: *Deleted

## 2019-11-22 DIAGNOSIS — I5022 Chronic systolic (congestive) heart failure: Secondary | ICD-10-CM | POA: Diagnosis not present

## 2019-11-22 LAB — CUP PACEART REMOTE DEVICE CHECK
Battery Remaining Longevity: 50 mo
Battery Voltage: 2.97 V
Brady Statistic AP VP Percent: 0.63 %
Brady Statistic AP VS Percent: 99.05 %
Brady Statistic AS VP Percent: 0 %
Brady Statistic AS VS Percent: 0.31 %
Brady Statistic RA Percent Paced: 99.52 %
Brady Statistic RV Percent Paced: 0.73 %
Date Time Interrogation Session: 20210104022724
HighPow Impedance: 40 Ohm
HighPow Impedance: 51 Ohm
Implantable Lead Implant Date: 20090204
Implantable Lead Implant Date: 20090204
Implantable Lead Location: 753859
Implantable Lead Location: 753860
Implantable Lead Model: 5076
Implantable Lead Model: 6947
Implantable Pulse Generator Implant Date: 20161130
Lead Channel Impedance Value: 304 Ohm
Lead Channel Impedance Value: 399 Ohm
Lead Channel Impedance Value: 437 Ohm
Lead Channel Pacing Threshold Amplitude: 0.875 V
Lead Channel Pacing Threshold Amplitude: 1 V
Lead Channel Pacing Threshold Pulse Width: 0.4 ms
Lead Channel Pacing Threshold Pulse Width: 0.4 ms
Lead Channel Sensing Intrinsic Amplitude: 1.375 mV
Lead Channel Sensing Intrinsic Amplitude: 1.375 mV
Lead Channel Sensing Intrinsic Amplitude: 4.125 mV
Lead Channel Sensing Intrinsic Amplitude: 4.125 mV
Lead Channel Setting Pacing Amplitude: 2 V
Lead Channel Setting Pacing Amplitude: 2.5 V
Lead Channel Setting Pacing Pulse Width: 0.4 ms
Lead Channel Setting Sensing Sensitivity: 0.3 mV

## 2019-12-06 ENCOUNTER — Ambulatory Visit (INDEPENDENT_AMBULATORY_CARE_PROVIDER_SITE_OTHER): Payer: Medicare Other

## 2019-12-06 DIAGNOSIS — I5022 Chronic systolic (congestive) heart failure: Secondary | ICD-10-CM | POA: Diagnosis not present

## 2019-12-06 DIAGNOSIS — Z95810 Presence of automatic (implantable) cardiac defibrillator: Secondary | ICD-10-CM

## 2019-12-08 NOTE — Progress Notes (Signed)
EPIC Encounter for ICM Monitoring  Patient Name: [name] is a 86 y.o. male Date: 12/08/2019 Primary Care Physican: Manon Hilding, MD Primary Cardiologist:Taylor Electrophysiologist: Lovena Le 12/08/2019 Weight:170 lbs   Spoke with patient and he is doing well.    Optivol thoracic impedancenormal.   Prescribed:Furosemide 20 mg 1 tablet as needed.  Recommendations:No changes and encouraged to call if experiencing any fluid symptoms.  Follow-up plan: ICM clinic phone appointment on2/22/2021. 91 day device clinic remote transmission 02/21/2020. Office visit with Dr Lovena Le 01/21/2020.  Copy of ICM check sent to Dr.Taylor.   3 month ICM trend: 12/06/2019    1 Year ICM trend:       Rosalene Billings, RN 12/08/2019 1:19 PM

## 2019-12-17 DIAGNOSIS — E039 Hypothyroidism, unspecified: Secondary | ICD-10-CM | POA: Diagnosis not present

## 2019-12-17 DIAGNOSIS — I10 Essential (primary) hypertension: Secondary | ICD-10-CM | POA: Diagnosis not present

## 2020-01-10 ENCOUNTER — Ambulatory Visit (INDEPENDENT_AMBULATORY_CARE_PROVIDER_SITE_OTHER): Payer: Medicare Other

## 2020-01-10 DIAGNOSIS — Z95810 Presence of automatic (implantable) cardiac defibrillator: Secondary | ICD-10-CM | POA: Diagnosis not present

## 2020-01-10 DIAGNOSIS — I5022 Chronic systolic (congestive) heart failure: Secondary | ICD-10-CM

## 2020-01-12 ENCOUNTER — Telehealth: Payer: Self-pay

## 2020-01-12 NOTE — Progress Notes (Signed)
EPIC Encounter for ICM Monitoring  Patient Name: [name] is a 86 y.o. male Date: 01/12/2020 Primary Care Physican: Manon Hilding, MD Primary Cardiologist:Taylor Electrophysiologist: Lovena Le 12/08/2019 Weight:170 lbs   Attempted call to patient and unable to reach.  Transmission reviewed.   Optivol thoracic impedancenormal.   Prescribed:Furosemide 20 mg 1 tablet as needed.  Recommendations:Unable to reach.    Follow-up plan: ICM clinic phone appointment on4/04/2020. 91 day device clinic remote transmission 02/21/2020. Office visit with Dr Lovena Le 01/21/2020.  Copy of ICM check sent to Dr.Taylor.  3 month ICM trend: 01/10/2020    1 Year ICM trend:       Rosalene Billings, RN 01/12/2020 2:12 PM

## 2020-01-12 NOTE — Telephone Encounter (Signed)
Remote ICM transmission received.  Attempted call to patient regarding ICM remote transmission and no answer or answering machine. 

## 2020-01-21 ENCOUNTER — Other Ambulatory Visit: Payer: Self-pay

## 2020-01-21 ENCOUNTER — Encounter: Payer: Self-pay | Admitting: Internal Medicine

## 2020-01-21 ENCOUNTER — Ambulatory Visit (INDEPENDENT_AMBULATORY_CARE_PROVIDER_SITE_OTHER): Payer: Medicare Other | Admitting: Internal Medicine

## 2020-01-21 VITALS — BP 148/90 | HR 84 | Ht 75.0 in | Wt 171.0 lb

## 2020-01-21 DIAGNOSIS — N529 Male erectile dysfunction, unspecified: Secondary | ICD-10-CM

## 2020-01-21 DIAGNOSIS — I493 Ventricular premature depolarization: Secondary | ICD-10-CM

## 2020-01-21 DIAGNOSIS — I5022 Chronic systolic (congestive) heart failure: Secondary | ICD-10-CM

## 2020-01-21 DIAGNOSIS — Z95810 Presence of automatic (implantable) cardiac defibrillator: Secondary | ICD-10-CM

## 2020-01-21 MED ORDER — AMIODARONE HCL 200 MG PO TABS: ORAL_TABLET | ORAL | 3 refills | Status: DC

## 2020-01-21 NOTE — Progress Notes (Signed)
HPI [name] returns today for followup. He is a pleasant 86 yo man with chronic systolic heart failure, s/p ICD insertion, dense ventricular ectopy, on low dose amiodarone. He has not had any additional VT since his last visit. He notes a cough after he eats.  No Known Allergies   Current Outpatient Medications  Medication Sig Dispense Refill  . finasteride (PROSCAR) 5 MG tablet Take 1 tablet by mouth daily.    Marland Kitchen levothyroxine (SYNTHROID, LEVOTHROID) 50 MCG tablet Take 50 mcg by mouth daily.      Marland Kitchen losartan (COZAAR) 25 MG tablet Take 25 mg by mouth daily.    . metoprolol tartrate (LOPRESSOR) 25 MG tablet Take 25 mg by mouth 2 (two) times daily.     Marland Kitchen omega-3 acid ethyl esters (LOVAZA) 1 G capsule Take 1 g by mouth 2 (two) times daily.    . Tamsulosin HCl (FLOMAX) 0.4 MG CAPS Take 0.4 mg by mouth daily.    Marland Kitchen amiodarone (PACERONE) 200 MG tablet Take one tablet by mouth daily Monday through Saturday.  Do NOT take on Sunday. 90 tablet 3   No current facility-administered medications for this visit.     Past Medical History:  Diagnosis Date  . Automatic implantable cardiac defibrillator in situ    MDT, implanted 2009  . CAD (coronary artery disease)    minimal by cath  . CHF NYHA class II ([place])   . DCM (dilated cardiomyopathy) ([place])     ROS:   All systems reviewed and negative except as noted in the HPI.   Past Surgical History:  Procedure Laterality Date  . CARDIAC CATHETERIZATION  12/17/07  . EP IMPLANTABLE DEVICE N/A 10/18/2015   Procedure: ICD Generator Changeout;  Surgeon: Evans Lance, MD;  Location: [facility];  Service: Cardiovascular;  Laterality: N/A;  . S/P ICD  2009     Family History  Problem Relation Age of Onset  . Tuberculosis Father      Social History   Socioeconomic History  . Marital status: Married    Spouse name: Not on file  . Number of children: Not on file  . Years of education: Not on file  . Highest education level:  Not on file  Occupational History  . Not on file  Tobacco Use  . Smoking status: Never Smoker  . Smokeless tobacco: Never Used  Substance and Sexual Activity  . Alcohol use: No  . Drug use: Never  . Sexual activity: Not on file  Other Topics Concern  . Not on file  Social History Narrative   Retired.    Social Determinants of Health   Financial Resource Strain:   . Difficulty of Paying Living Expenses: Not on file  Food Insecurity:   . Worried About Charity fundraiser in the Last Year: Not on file  . Ran Out of Food in the Last Year: Not on file  Transportation Needs:   . Lack of Transportation (Medical): Not on file  . Lack of Transportation (Non-Medical): Not on file  Physical Activity:   . Days of Exercise per Week: Not on file  . Minutes of Exercise per Session: Not on file  Stress:   . Feeling of Stress : Not on file  Social Connections:   . Frequency of Communication with Friends and Family: Not on file  . Frequency of Social Gatherings with Friends and Family: Not on file  . Attends Religious Services: Not on file  .  Active Member of Clubs or Organizations: Not on file  . Attends Archivist Meetings: Not on file  . Marital Status: Not on file  Intimate Partner Violence:   . Fear of Current or Ex-Partner: Not on file  . Emotionally Abused: Not on file  . Physically Abused: Not on file  . Sexually Abused: Not on file     BP (!) 148/90   Pulse 84   Ht 6\' 3"  (1.905 m)   Wt 171 lb (77.6 kg)   SpO2 98%   BMI 21.37 kg/m   Physical Exam:  Well appearing NAD HEENT: Unremarkable Neck:  No JVD, no thyromegally Lymphatics:  No adenopathy Back:  No CVA tenderness Lungs:  Clear with no wheezes HEART:  Regular rate rhythm, no murmurs, no rubs, no clicks Abd:  soft, positive bowel sounds, no organomegally, no rebound, no guarding Ext:  2 plus pulses, no edema, no cyanosis, no clubbing Skin:  No rashes no nodules Neuro:  CN II through XII intact,  motor grossly intact  EKG - nsr  DEVICE  Normal device function.  See PaceArt for details.   Assess/Plan: 1. VT - he has had no recurrent symptoms over the past year. I have asked him to reduce his dose of amiodarone. 2. Chronic systolic heart failure - his EF is 25% chronically. He will continue his current meds. 3. ICD - his medtronic DDD ICD has about 4 years of battery longevity. 4. Atrial fib - he has maintained NSR. He will continue low dose amiodarone.   Mikle Bosworth.D.

## 2020-01-21 NOTE — Patient Instructions (Addendum)
Medication Instructions:  Your physician has recommended you make the following change in your medication:   1.  Reduce your amiodarone 200 mg tablets- Take one tablet by mouth daily Monday through Saturday.  Do NOT take on Sunday.  Labwork: None ordered.  Testing/Procedures: None ordered.  Follow-Up: Your physician wants you to follow-up in: one year with Dr. Lovena Le.   You will receive a reminder letter in the mail two months in advance. If you don't receive a letter, please call our office to schedule the follow-up appointment.  Remote monitoring is used to monitor your ICD from home. This monitoring reduces the number of office visits required to check your device to one time per year. It allows [place] to keep an eye on the functioning of your device to ensure it is working properly. You are scheduled for a device check from home on 02/21/2020. You may send your transmission at any time that day. If you have a wireless device, the transmission will be sent automatically. After your physician reviews your transmission, you will receive a postcard with your next transmission date.  Any Other Special Instructions Will Be Listed Below (If Applicable).  If you need a refill on your cardiac medications before your next appointment, please call your pharmacy.

## 2020-01-27 DIAGNOSIS — Z23 Encounter for immunization: Secondary | ICD-10-CM | POA: Diagnosis not present

## 2020-02-16 DIAGNOSIS — I10 Essential (primary) hypertension: Secondary | ICD-10-CM | POA: Diagnosis not present

## 2020-02-16 DIAGNOSIS — E039 Hypothyroidism, unspecified: Secondary | ICD-10-CM | POA: Diagnosis not present

## 2020-02-21 ENCOUNTER — Ambulatory Visit (INDEPENDENT_AMBULATORY_CARE_PROVIDER_SITE_OTHER): Payer: Medicare Other | Admitting: *Deleted

## 2020-02-21 DIAGNOSIS — I5022 Chronic systolic (congestive) heart failure: Secondary | ICD-10-CM

## 2020-02-21 LAB — CUP PACEART REMOTE DEVICE CHECK
Battery Remaining Longevity: 49 mo
Battery Voltage: 2.97 V
Brady Statistic AP VP Percent: 0.35 %
Brady Statistic AP VS Percent: 99.5 %
Brady Statistic AS VP Percent: 0 %
Brady Statistic AS VS Percent: 0.15 %
Brady Statistic RA Percent Paced: 99.82 %
Brady Statistic RV Percent Paced: 0.38 %
Date Time Interrogation Session: 20210405033423
HighPow Impedance: 39 Ohm
HighPow Impedance: 48 Ohm
Implantable Lead Implant Date: 20090204
Implantable Lead Implant Date: 20090204
Implantable Lead Location: 753859
Implantable Lead Location: 753860
Implantable Lead Model: 5076
Implantable Lead Model: 6947
Implantable Pulse Generator Implant Date: 20161130
Lead Channel Impedance Value: 304 Ohm
Lead Channel Impedance Value: 342 Ohm
Lead Channel Impedance Value: 456 Ohm
Lead Channel Pacing Threshold Amplitude: 0.875 V
Lead Channel Pacing Threshold Amplitude: 0.875 V
Lead Channel Pacing Threshold Pulse Width: 0.4 ms
Lead Channel Pacing Threshold Pulse Width: 0.4 ms
Lead Channel Sensing Intrinsic Amplitude: 1.5 mV
Lead Channel Sensing Intrinsic Amplitude: 1.5 mV
Lead Channel Sensing Intrinsic Amplitude: 3.5 mV
Lead Channel Sensing Intrinsic Amplitude: 3.5 mV
Lead Channel Setting Pacing Amplitude: 2 V
Lead Channel Setting Pacing Amplitude: 2.5 V
Lead Channel Setting Pacing Pulse Width: 0.4 ms
Lead Channel Setting Sensing Sensitivity: 0.3 mV

## 2020-02-22 ENCOUNTER — Ambulatory Visit (INDEPENDENT_AMBULATORY_CARE_PROVIDER_SITE_OTHER): Payer: Medicare Other

## 2020-02-22 DIAGNOSIS — Z95810 Presence of automatic (implantable) cardiac defibrillator: Secondary | ICD-10-CM

## 2020-02-22 DIAGNOSIS — I5022 Chronic systolic (congestive) heart failure: Secondary | ICD-10-CM

## 2020-02-22 NOTE — Progress Notes (Signed)
ICD Remote  

## 2020-02-23 NOTE — Progress Notes (Signed)
EPIC Encounter for ICM Monitoring  Patient Name: [name] is a 86 y.o. male Date: 02/23/2020 Primary Care Physican: Manon Hilding, MD Primary Cardiologist:Taylor Electrophysiologist: Lovena Le 3/5/2021Office Weight:171 lbs   Transmission reviewed.   Optivol thoracic impedancenormal.   Recommendations:None    Follow-up plan: ICM clinic phone appointment on5/08/2020. 91 day device clinic remote transmission7/04/2020.   Copy of ICM check sent to Dr.Taylor.  3 month ICM trend: 02/22/2020    1 Year ICM trend:       Rosalene Billings, RN 02/23/2020 5:05 PM

## 2020-02-26 DIAGNOSIS — Z23 Encounter for immunization: Secondary | ICD-10-CM | POA: Diagnosis not present

## 2020-03-27 ENCOUNTER — Ambulatory Visit (INDEPENDENT_AMBULATORY_CARE_PROVIDER_SITE_OTHER): Payer: Medicare Other

## 2020-03-27 DIAGNOSIS — I5022 Chronic systolic (congestive) heart failure: Secondary | ICD-10-CM

## 2020-03-27 DIAGNOSIS — Z95810 Presence of automatic (implantable) cardiac defibrillator: Secondary | ICD-10-CM | POA: Diagnosis not present

## 2020-03-29 ENCOUNTER — Telehealth: Payer: Self-pay

## 2020-03-29 NOTE — Progress Notes (Signed)
EPIC Encounter for ICM Monitoring  Patient Name: [name] is a 86 y.o. male Date: 03/29/2020 Primary Care Physican: Manon Hilding, MD Primary Cardiologist:Taylor Electrophysiologist: Taylor 3/5/2021Office Weight:171 lbs   Attempted call to patient and unable to reach.   Transmission reviewed.   Optivol thoracic impedancenormal.   Recommendations:Unable to reach.    Follow-up plan: ICM clinic phone appointment on6/14/2021. 91 day device clinic remote transmission7/04/2020.   Copy of ICM check sent to Dr.Taylor.  3 month ICM trend: 03/27/2020    1 Year ICM trend:       Rosalene Billings, RN 03/29/2020 10:46 AM

## 2020-03-29 NOTE — Telephone Encounter (Signed)
Remote ICM transmission received.  Attempted call to patient regarding ICM remote transmission and phone rang and then disconnected.

## 2020-03-30 ENCOUNTER — Telehealth: Payer: Self-pay | Admitting: Internal Medicine

## 2020-03-30 DIAGNOSIS — R4781 Slurred speech: Secondary | ICD-10-CM

## 2020-03-30 NOTE — Telephone Encounter (Signed)
[name] is calling stating he is thinking he is having a TIA due to having slurred speech that is progressively getting worse. He states he is having no other symptoms. Please advise.

## 2020-03-30 NOTE — Progress Notes (Addendum)
Cardiology Office Note Date:  03/30/2020  Patient ID:  [name], DOB Oct 09, 1934, MRN [id number] PCP:  Manon Hilding, MD  Cardiologist/Electrophysiologist: Dr. Lovena Le    Chief Complaint:   slurred speech  History of Present Illness: [name] is a 86 y.o. male with history of non-ischemic CM, minimal CAD by cath 5993, chronic systolic CHF, PVCs/VT on amiodarone, autonomic dysfunction.    He last saw Dr. Lovena Le in March, at tat time doing well.  His amiodarone dose reduced to taking 1 tab Mon-Sat, none on Sunday.    He had done well until about a month ago he noted trouble speaking, he thought was allergies, pollen causing a problem with his throat, though it has persisted now weeks and his family and neighbor neighbor have insisted he get checked.  He does mention calling his PMD office a week or so ago and the nurse told him he needed to go to the hospital, but since he felt fine otherwise, he thought she was overreacting and did not think was necessary. He has not noted any physical changes, weakness, numbness.  He tells me he has been out working his property as usual and without difficulty. His brother says that he sometimes is harder to understand, even unable to tell what he is saying.  They both have noticed that he tends to drool from the right side of his mouth. Once a couple weeks ago he had choked a bit on his food and had to cough it up, but denies difficulty with swallowing/eating otherwise  No CP, palpitations, no SOB, DOE, no near syncope or syncope.  His brother mentions about 3 weeks ago he had fallen in the yard twice.  Unclear why or how, but the patient is certain he did not faint.   Device infofmation MDT dual chamber ICD implanted 12/23/2007, gen change 2016  AAD hx Amiodarone initially for PVCs started 2014 + h/o VT below detection requiring external cardioversion March 2019, device was reprogrammed and amiodarone dose increased Is current drug for him     Past Medical History:  Diagnosis Date  . Automatic implantable cardiac defibrillator in situ    MDT, implanted 2009  . CAD (coronary artery disease)    minimal by cath  . CHF NYHA class II ([place])   . DCM (dilated cardiomyopathy) ([place])     Past Surgical History:  Procedure Laterality Date  . CARDIAC CATHETERIZATION  12/17/07  . EP IMPLANTABLE DEVICE N/A 10/18/2015   Procedure: ICD Generator Changeout;  Surgeon: Evans Lance, MD;  Location: [facility];  Service: Cardiovascular;  Laterality: N/A;  . S/P ICD  2009    Current Outpatient Medications  Medication Sig Dispense Refill  . amiodarone (PACERONE) 200 MG tablet Take one tablet by mouth daily Monday through Saturday.  Do NOT take on Sunday. 90 tablet 3  . finasteride (PROSCAR) 5 MG tablet Take 1 tablet by mouth daily.    Marland Kitchen levothyroxine (SYNTHROID, LEVOTHROID) 50 MCG tablet Take 50 mcg by mouth daily.      Marland Kitchen losartan (COZAAR) 25 MG tablet Take 25 mg by mouth daily.    . metoprolol tartrate (LOPRESSOR) 25 MG tablet Take 25 mg by mouth 2 (two) times daily.     Marland Kitchen omega-3 acid ethyl esters (LOVAZA) 1 G capsule Take 1 g by mouth 2 (two) times daily.    . Tamsulosin HCl (FLOMAX) 0.4 MG CAPS Take 0.4 mg by mouth daily.     No  current facility-administered medications for this visit.    Allergies:   Patient has no known allergies.   Social History:  The patient  reports that he has never smoked. He has never used smokeless tobacco. He reports that he does not drink alcohol or use drugs.   Family History:  The patient's family history includes Tuberculosis in his father.  ROS:  Please see the history of present illness.   All other systems are reviewed and otherwise negative.   PHYSICAL EXAM:  VS:  There were no vitals taken for this visit. BMI: There is no height or weight on file to calculate BMI. Well nourished, well developed, in no acute distress  HEENT: normocephalic, atraumatic  Neck: no JVD, carotid bruits or  masses Cardiac:  RRR; no significant murmurs, no rubs, or gallops Lungs: CTA b/l, no wheezing, rhonchi or rales  Abd: soft, nontender MS: no deformity, age appropriate atrophy Ext:  no edema  Skin: warm and dry, no rash Neuro:  I do not note any facial droop or aymmetry, his tongue is midline, he is very strong b/l upper and lower extremities without any clear focal motor deficits.  His speech is slurred some, I do not know him previously , his brother says is slurred, patient too, some words were difficult to understand, though I was able to understand him.  No word finding difficulties or inappropriate word use Psych: euthymic mood, full affect   ICD site is stable, no tethering or discomfort   EKG:  Done today and reviewed by myself shows AP/VS with long PR, no acute looking changes  ICD interrogation done today and reviewed by myself: Battery and lead measurements are good No arrhythmias, I did an interrogate all and nothing since the VT back in 2019.  NO AFIB He is programmed MVP VP 0.9%  10/16/2015; TTE Study Conclusions  - Left ventricle: The cavity size was moderately dilated. Systolic  function was mildly to moderately reduced. The estimated ejection  fraction was in the range of 40% to 45%. Diffuse hypokinesis.  Doppler parameters are consistent with diastolic dysfunction and  elevated LV filling pressure.  - Aortic valve: There was mild regurgitation.  - Mitral valve: Mildly thickened leaflets . There was mild  regurgitation.  - Left atrium: The atrium was normal in size.  - Right ventricle: Pacer wire or catheter noted in right ventricle.  - Right atrium: The atrium was mildly dilated. Pacer wire or  catheter noted in right atrium.  - Tricuspid valve: There was mild regurgitation.  - Pulmonary arteries: PA peak pressure: 27 mm Hg (S).  - Inferior vena cava: The vessel was normal in size. The  respirophasic diameter changes were in the normal range (=  50%),  consistent with normal central venous pressure.   Impressions:  - LVEF 40-45%, normal wall thickness, moderately dilated LV,  diastolic dysfunction with elevated LV filling pressure, mild AI,  MR and TR, normal RVSP, pacer or AICD wires noted.    Recent Labs: No recent labs found  Wt Readings from Last 3 Encounters:  01/21/20 171 lb (77.6 kg)  01/01/19 192 lb (87.1 kg)  07/13/18 173 lb 3.2 oz (78.6 kg)     Other studies reviewed: Additional studies/records reviewed today include prior records, device remote  DEVICE INFORMATION:  MDT [place] implanted 12/23/07, Dr. Lovena Le  ASSESSMENT AND PLAN:  1. ICD     Intact function     No programming changes made  2. Non-ischemic CM  NO symptoms or exam findings to suggest volume OL< OptiVol is well below threshold     On BB/ARB tx  3. PVCs, VT     On amiodarone     Labs today  4. Slurred speech      Dr. Tanna Furry note in March mentions AFib, and amiodarone.  This is the first mention of this and I find no evidence of AFib via his device      No carotid bruits appreciated      BP good       I reviewed the case with DOD, Dr. Burt Knack.  Since he has the CT ordered wiuld pursue that, otherwise, his symptom has been ongoing for a month and no need for the ER.  To follow up ASAP with his PMD       They tell me that his PMD office os quite good, and they will call today and get an appointment           Disposition: from a cardiac/device perspective, continue remotes Q 3 mo, and in clinic in 70mo, will f/u CT, and he will reach out to his PMD when he gets home.  Discussed if any escalation or new symptoms to seek medical attention immediately/ER or EMS if needed   ADDEND: IMPRESSION: CT head: 1. No evidence of acute intracranial abnormality. 2. Mild generalized parenchymal atrophy. 3. Small left maxillary sinus mucous retention cyst. CTA head: 1. No intracranial large vessel occlusion or proximal high-grade arterial  stenosis. 2. Moderate focal atherosclerotic narrowing within a mid M2 right MCA branch vessel. 3. Atherosclerotic irregularity of the P3 and more distal right posterior cerebral artery. 4. Calcified plaque within the intracranial internal carotid arteries without significant stenosis.  The pt was allowed to leave from [place] at [facility]  No change in plan.    Current medicines are reviewed at length with the patient today.  The patient did not have any concerns regarding medicines.  Haywood Lasso, PA-C 03/30/2020 12:02 PM     [place] [address] [place] [phone] (office)  [phone] (fax)

## 2020-03-30 NOTE — Telephone Encounter (Signed)
Consulted DOD, Dr. Meda Coffee, she recommend a stat head CT, but does not think patient needs to go to ED since symptoms are not acute. Called patient to let him know CT has been ordered on him and he will get a call to schedule and to keep his appointment tomorrow morning. Patient verbalized understanding.

## 2020-03-30 NOTE — Telephone Encounter (Signed)
Called patient back about message. Patient stated he has slurred speech for about 1 month. Informed patient if he had just started having slurred speech he would need to call 911. Since patient stated he has had slurred speech for over a month and has no other deficits reported advised patient he could come in to see one of Dr. Tanna Furry PAs. Patient will come in tomorrow morning to see Tommye Standard PA. Advised patient if his symptoms get worse to go to ED. Patient verbalized understanding.

## 2020-03-31 ENCOUNTER — Other Ambulatory Visit: Payer: Self-pay | Admitting: Physician Assistant

## 2020-03-31 ENCOUNTER — Ambulatory Visit (HOSPITAL_COMMUNITY)
Admission: RE | Admit: 2020-03-31 | Discharge: 2020-03-31 | Disposition: A | Discharge status: Home / Self Care | Payer: Medicare Other | Source: Ambulatory Visit | Attending: Cardiology | Admitting: Cardiology

## 2020-03-31 ENCOUNTER — Ambulatory Visit (INDEPENDENT_AMBULATORY_CARE_PROVIDER_SITE_OTHER): Payer: Medicare Other | Admitting: Physician Assistant

## 2020-03-31 ENCOUNTER — Other Ambulatory Visit: Payer: Self-pay

## 2020-03-31 ENCOUNTER — Telehealth: Payer: Self-pay | Admitting: Physician Assistant

## 2020-03-31 ENCOUNTER — Encounter (INDEPENDENT_AMBULATORY_CARE_PROVIDER_SITE_OTHER): Payer: Self-pay

## 2020-03-31 ENCOUNTER — Other Ambulatory Visit (HOSPITAL_COMMUNITY): Payer: Self-pay | Admitting: Cardiology

## 2020-03-31 VITALS — BP 148/82 | HR 69 | Ht 75.0 in | Wt 168.0 lb

## 2020-03-31 DIAGNOSIS — I6523 Occlusion and stenosis of bilateral carotid arteries: Secondary | ICD-10-CM | POA: Diagnosis not present

## 2020-03-31 DIAGNOSIS — R4781 Slurred speech: Secondary | ICD-10-CM

## 2020-03-31 DIAGNOSIS — I428 Other cardiomyopathies: Secondary | ICD-10-CM | POA: Diagnosis not present

## 2020-03-31 DIAGNOSIS — I5022 Chronic systolic (congestive) heart failure: Secondary | ICD-10-CM | POA: Diagnosis not present

## 2020-03-31 DIAGNOSIS — Z79899 Other long term (current) drug therapy: Secondary | ICD-10-CM | POA: Diagnosis not present

## 2020-03-31 DIAGNOSIS — I6621 Occlusion and stenosis of right posterior cerebral artery: Secondary | ICD-10-CM | POA: Diagnosis not present

## 2020-03-31 DIAGNOSIS — Z95810 Presence of automatic (implantable) cardiac defibrillator: Secondary | ICD-10-CM | POA: Diagnosis not present

## 2020-03-31 LAB — BASIC METABOLIC PANEL
BUN/Creatinine Ratio: 15 (ref 10–24)
BUN: 17 mg/dL (ref 8–27)
CO2: 29 mmol/L (ref 20–29)
Calcium: 9 mg/dL (ref 8.6–10.2)
Chloride: 103 mmol/L (ref 96–106)
Creatinine, Ser: 1.12 mg/dL (ref 0.76–1.27)
GFR calc Af Amer: 70 mL/min/{1.73_m2} (ref >59)
GFR calc non Af Amer: 60 mL/min/{1.73_m2} (ref >59)
Glucose: 91 mg/dL (ref 65–99)
Potassium: 4.7 mmol/L (ref 3.5–5.2)
Sodium: 138 mmol/L (ref 134–144)

## 2020-03-31 LAB — HEPATIC FUNCTION PANEL
ALT: 28 IU/L (ref 0–44)
AST: 23 IU/L (ref 0–40)
Albumin: 4.1 g/dL (ref 3.6–4.6)
Alkaline Phosphatase: 94 IU/L (ref 39–117)
Bilirubin Total: 0.7 mg/dL (ref 0.0–1.2)
Bilirubin, Direct: 0.18 mg/dL (ref 0.00–0.40)
Total Protein: 6.2 g/dL (ref 6.0–8.5)

## 2020-03-31 LAB — TSH: TSH: 5.36 u[IU]/mL — ABNORMAL HIGH (ref 0.450–4.500)

## 2020-03-31 LAB — POCT I-STAT CREATININE: Creatinine, Ser: 1.1 mg/dL (ref 0.61–1.24)

## 2020-03-31 MED ORDER — IOHEXOL 350 MG/ML SOLN
100.0000 mL | Freq: Once | INTRAVENOUS | Status: AC | PRN
  Administered 2020-03-31: 100 mL via INTRAVENOUS

## 2020-03-31 NOTE — Telephone Encounter (Signed)
Patient's daughter calling to follow up. States she has not heard from her father. She's aware he had a CT today but is unsure what it is for. Please advise.

## 2020-03-31 NOTE — Patient Instructions (Addendum)
Medication Instructions:   Your physician recommends that you continue on your current medications as directed. Please refer to the Current Medication list given to you today.   *If you need a refill on your cardiac medications before your next appointment, please call your pharmacy*   Lab Work:  BMET LFT AND TSH TODAY   If you have labs (blood work) drawn today and your tests are completely normal, you will receive your results only by: Marland Kitchen MyChart Message (if you have MyChart) OR . A paper copy in the mail If you have any lab test that is abnormal or we need to change your treatment, we will call you to review the results.   Testing/Procedures Non-Cardiac CT Angiography (CTA), is a special type of CT scan that uses a computer to produce multi-dimensional views of major blood vessels throughout the body. In CT angiography, a contrast material is injected through an IV to help visualize the blood vessels   Follow-Up: At [facility], you and your health needs are our priority.  As part of our continuing mission to provide you with exceptional heart care, we have created designated Provider Care Teams.  These Care Teams include your primary Cardiologist (physician) and Advanced Practice Providers (APPs -  Physician Assistants and Nurse Practitioners) who all work together to provide you with the care you need, when you need it.  We recommend signing up for the patient portal called "MyChart".  Sign up information is provided on this After Visit Summary.  MyChart is used to connect with patients for Virtual Visits (Telemedicine).  Patients are able to view lab/test results, encounter notes, upcoming appointments, etc.  Non-urgent messages can be sent to your provider as well.   To learn more about what you can do with MyChart, go to [email or web address].    Your next appointment:   6 month(s)  The format for your next appointment:   In Person  Provider:   You may see Dr. Lovena Le  or  one of the following Advanced Practice Providers on your designated Care Team:    Chanetta Marshall, NP  Tommye Standard, PA-C  Legrand Como "Oda [place], [place]    Other Instructions

## 2020-03-31 NOTE — Telephone Encounter (Signed)
CT dept called for lab result. Advised them lab results were just done this morning we don't have lab results yet.

## 2020-04-03 ENCOUNTER — Telehealth: Payer: Self-pay | Admitting: Cardiology

## 2020-04-03 DIAGNOSIS — I679 Cerebrovascular disease, unspecified: Secondary | ICD-10-CM

## 2020-04-03 DIAGNOSIS — R4781 Slurred speech: Secondary | ICD-10-CM

## 2020-04-03 NOTE — Telephone Encounter (Signed)
CT ANGIO HEAD W OR WO CONTRAST: Result Notes  Dorothy Spark, MD  03/31/2020 3:51 PM EDT    No acute findings but he should follow with neurology  1. No intracranial large vessel occlusion or proximal high-grade arterial stenosis. 2. Moderate focal atherosclerotic narrowing within a mid M2 right MCA branch vessel. 3. Atherosclerotic irregularity of the P3 and more distal right posterior cerebral artery.     Informed the pt of preliminary read of his CT Angio Head, as interpreted by Dr. Meda Coffee who ordered this for a triage nurse, on her DOD day.  Pt aware of Dr. Francesca Oman interpretation, but wants to know if Tommye Standard PA-C has seen this yet, and given her recommendations about this.  Informed the pt that I will route this message to Tommye Standard PA-C and her covering CMA to further review and advise on pts recent CT Angio, and follow-up with the pt thereafter.  Pt verbalized understanding and agrees with this plan.

## 2020-04-03 NOTE — Telephone Encounter (Signed)
[name] is calling requesting a nurse call him to give him his CT results. Please advise.

## 2020-04-04 DIAGNOSIS — R4702 Dysphasia: Secondary | ICD-10-CM | POA: Diagnosis not present

## 2020-04-04 DIAGNOSIS — R2689 Other abnormalities of gait and mobility: Secondary | ICD-10-CM | POA: Diagnosis not present

## 2020-04-04 DIAGNOSIS — R1310 Dysphagia, unspecified: Secondary | ICD-10-CM | POA: Diagnosis not present

## 2020-04-04 DIAGNOSIS — Z6821 Body mass index (BMI) 21.0-21.9, adult: Secondary | ICD-10-CM | POA: Diagnosis not present

## 2020-04-04 DIAGNOSIS — I10 Essential (primary) hypertension: Secondary | ICD-10-CM | POA: Diagnosis not present

## 2020-04-04 NOTE — Telephone Encounter (Signed)
I spoke with patient's brother and reviewed results with him. Patient has not seen neurology before and they would like referral.  Will place referral to [facility]. Patient is seeing Dr Quintin Alto (PCP) today.  Will forward CT results to Dr Quintin Alto.

## 2020-04-04 NOTE — Telephone Encounter (Signed)
Follow Up  Received a phone call from brother Hermon and sister-in-law and they want the doctor to call them instead of the patient for his CT Angio Head results. Says that patient isn't in his right mind to receive them. Brother Shea Stakes is on [place]. Says call number [phone]

## 2020-04-05 ENCOUNTER — Telehealth: Payer: Self-pay | Admitting: *Deleted

## 2020-04-05 NOTE — Telephone Encounter (Signed)
ERROR

## 2020-04-05 NOTE — Telephone Encounter (Signed)
Lvm for patient and brother of normal results to call back clinic to discuss about PMD visit about slurred speech.

## 2020-04-05 NOTE — Telephone Encounter (Signed)
Follow up  Lelan Pons sister in law called, no DPR on file she gave information to give to [facility], she said she was with pt yesterday to his appt with pcp. Dr. Quintin Alto made an appt for pt to see a neurologist. Pt had multiple strokes and may have affected his speech and have difficulty swallowing. Pt might have to meet with a speech therapy, also, pt will have thyroid test ordered by pcp

## 2020-04-10 ENCOUNTER — Ambulatory Visit (HOSPITAL_COMMUNITY): Payer: Medicare Other | Admitting: Speech Pathology

## 2020-04-10 ENCOUNTER — Encounter (HOSPITAL_COMMUNITY): Payer: Self-pay

## 2020-04-17 DIAGNOSIS — E7849 Other hyperlipidemia: Secondary | ICD-10-CM | POA: Diagnosis not present

## 2020-04-17 DIAGNOSIS — I482 Chronic atrial fibrillation, unspecified: Secondary | ICD-10-CM | POA: Diagnosis not present

## 2020-04-17 DIAGNOSIS — I129 Hypertensive chronic kidney disease with stage 1 through stage 4 chronic kidney disease, or unspecified chronic kidney disease: Secondary | ICD-10-CM | POA: Diagnosis not present

## 2020-04-17 DIAGNOSIS — N183 Chronic kidney disease, stage 3 unspecified: Secondary | ICD-10-CM | POA: Diagnosis not present

## 2020-04-21 DIAGNOSIS — E782 Mixed hyperlipidemia: Secondary | ICD-10-CM | POA: Diagnosis not present

## 2020-04-21 DIAGNOSIS — N183 Chronic kidney disease, stage 3 unspecified: Secondary | ICD-10-CM | POA: Diagnosis not present

## 2020-04-21 DIAGNOSIS — I10 Essential (primary) hypertension: Secondary | ICD-10-CM | POA: Diagnosis not present

## 2020-04-21 DIAGNOSIS — E039 Hypothyroidism, unspecified: Secondary | ICD-10-CM | POA: Diagnosis not present

## 2020-04-25 DIAGNOSIS — R4702 Dysphasia: Secondary | ICD-10-CM | POA: Diagnosis not present

## 2020-04-25 DIAGNOSIS — Z6821 Body mass index (BMI) 21.0-21.9, adult: Secondary | ICD-10-CM | POA: Diagnosis not present

## 2020-04-25 DIAGNOSIS — I472 Ventricular tachycardia: Secondary | ICD-10-CM | POA: Diagnosis not present

## 2020-04-25 DIAGNOSIS — I10 Essential (primary) hypertension: Secondary | ICD-10-CM | POA: Diagnosis not present

## 2020-04-25 DIAGNOSIS — N401 Enlarged prostate with lower urinary tract symptoms: Secondary | ICD-10-CM | POA: Diagnosis not present

## 2020-05-01 ENCOUNTER — Ambulatory Visit (INDEPENDENT_AMBULATORY_CARE_PROVIDER_SITE_OTHER): Payer: Medicare Other

## 2020-05-01 DIAGNOSIS — Z95810 Presence of automatic (implantable) cardiac defibrillator: Secondary | ICD-10-CM | POA: Diagnosis not present

## 2020-05-01 DIAGNOSIS — I5022 Chronic systolic (congestive) heart failure: Secondary | ICD-10-CM | POA: Diagnosis not present

## 2020-05-01 NOTE — Progress Notes (Signed)
EPIC Encounter for ICM Monitoring  Patient Name: [name] is a 86 y.o. male Date: 05/01/2020 Primary Care Physican: Manon Hilding, MD Primary Cardiologist:Taylor Electrophysiologist: Lovena Le 3/5/2021OfficeWeight:171lbs   Transmission reviewed.   Optivol thoracic impedancenormal.   Recommendations:None  Follow-up plan: ICM clinic phone appointment on7/19/2021. 91 day device clinic remote transmission7/04/2020.   Copy of ICM check sent to Dr.Taylor.  3 month ICM trend: 05/01/2020    1 Year ICM trend:       Rosalene Billings, RN 05/01/2020 5:05 PM

## 2020-05-11 ENCOUNTER — Encounter (HOSPITAL_COMMUNITY): Payer: Self-pay | Admitting: Speech Pathology

## 2020-05-11 ENCOUNTER — Ambulatory Visit (HOSPITAL_COMMUNITY): Payer: Medicare Other | Attending: Family Medicine | Admitting: Speech Pathology

## 2020-05-11 ENCOUNTER — Other Ambulatory Visit: Payer: Self-pay

## 2020-05-11 DIAGNOSIS — R1312 Dysphagia, oropharyngeal phase: Secondary | ICD-10-CM | POA: Insufficient documentation

## 2020-05-11 DIAGNOSIS — R41841 Cognitive communication deficit: Secondary | ICD-10-CM

## 2020-05-11 NOTE — Therapy (Signed)
[place], [place], [address] Phone: [phone]   Fax:  [phone]  Speech Language Pathology Evaluation  Patient Details  Name: [name] MRN: [id number] Date of Birth: 03-23-1934 Referring Provider (SLP): Consuello Masse, MD   Encounter Date: 05/11/2020   End of Session - 05/11/20 1118    Visit Number 1    Authorization Type Medicare    SLP Start Time 1033    SLP Stop Time  1130    SLP Time Calculation (min) 57 min    Activity Tolerance Patient tolerated treatment well           Past Medical History:  Diagnosis Date  . Automatic implantable cardiac defibrillator in situ    MDT, implanted 2009  . CAD (coronary artery disease)    minimal by cath  . CHF NYHA class II ([place])   . DCM (dilated cardiomyopathy) ([place])     Past Surgical History:  Procedure Laterality Date  . CARDIAC CATHETERIZATION  12/17/07  . EP IMPLANTABLE DEVICE N/A 10/18/2015   Procedure: ICD Generator Changeout;  Surgeon: Evans Lance, MD;  Location: [facility];  Service: Cardiovascular;  Laterality: N/A;  . S/P ICD  2009    There were no vitals filed for this visit.   Subjective Assessment - 05/11/20 1052    Subjective "Can't talk right and I have trouble swallowing little bits"    Currently in Pain? No/denies              SLP Evaluation [facility] - 05/11/20 1052      SLP Visit Information   SLP Received On 05/11/20    Referring Provider (SLP) Consuello Masse, MD    Onset Date 03/18/2020      General Information   HPI [name] is an 86 yo male who was referred by Dr. Consuello Masse for a cognitive linguistic evaluation and clinical swallow evaluation due to Pt with reports of changes in his speech (dysarthria) and swallowing. His head CT on 03/31/20 was negative for acute changes. No MRI. Pt will see Dr. Leonie Man (Neurology) in July. [name] lives alone and has a 9th grade education. Family and friends live nearby.     Behavioral/Cognition Alert and cooperative    Mobility Status ambulatory      Balance Screen   Has the patient fallen in the past 6 months No    Has the patient had a decrease in activity level because of a fear of falling?  No    Is the patient reluctant to leave their home because of a fear of falling?  No      Prior Functional Status   Cognitive/Linguistic Baseline Baseline deficits    Baseline deficit details Pt and friend report baseline mild short term memory deficits; limited match skills due to 9th grade education    Type of Home House     Lives With Alone    Available Support Friend(s)    Education 9th grade    Vocation Retired      Associate Professor   Overall Cognitive Status History of cognitive impairments - at baseline    Attention Sustained    Sustained Attention Impaired    Sustained Attention Impairment Verbal complex    Memory Impaired    Memory Impairment Storage deficit    Awareness Appears intact    Problem Solving Appears intact      Auditory Comprehension   Overall Auditory Comprehension Impaired  Yes/No Questions Within Functional Limits    Commands Within Functional Limits   repetition due to [facility] Moderately complex    Interfering Components Attention;Working Marine scientist;Hearing    EffectiveTechniques Extra processing time;Increased volume;Stressing words      Visual Recognition/Discrimination   Discrimination Within Function Limits      Reading Comprehension   Reading Status Not tested      Expression   Primary Mode of Expression Verbal      Verbal Expression   Overall Verbal Expression Appears within functional limits for tasks assessed    Initiation No impairment    Automatic Speech Name;Social Response    Level of Generative/Spontaneous Verbalization Conversation    Repetition No impairment    Naming No impairment    Pragmatics No impairment    Interfering Components Speech intelligibility    Non-Verbal Means of Communication Not  applicable      Written Expression   Dominant Hand Right    Written Expression Not tested      Oral Motor/Sensory Function   Overall Oral Motor/Sensory Function Impaired    Labial ROM Within Functional Limits    Labial Symmetry Within Functional Limits    Labial Strength Within Functional Limits    Labial Sensation Within Functional Limits    Labial Coordination WFL    Lingual ROM Within Functional Limits    Lingual Symmetry Within Functional Limits    Lingual Strength Reduced    Lingual Sensation Within Functional Limits    Lingual Coordination WFL    Facial ROM Within Functional Limits    Facial Symmetry Within Functional Limits    Facial Strength Within Functional Limits    Facial Sensation Within Functional Limits    Facial Coordination WFL    Velum Within Functional Limits    Mandible Within Functional Limits    Overall Oral Motor/Sensory Function --   mild lingual weakness, coated tongue, Pt reports drooling R     Motor Speech   Overall Motor Speech Impaired    Respiration Within functional limits    Phonation Normal    Resonance Within functional limits    Articulation Impaired    Level of Impairment Sentence    Intelligibility Intelligibility reduced    Word 75-100% accurate    Phrase 75-100% accurate    Sentence 75-100% accurate    Conversation 75-100% accurate    Motor Planning Witnin functional limits    Motor Speech Errors Aware    Interfering Components --   Pt with U/L dentures   Effective Techniques Slow rate;Over-articulate    Phonation WFL      Standardized Assessments   Standardized Assessments  --   SLUMS 15/30             General - 05/11/20 1052      General Information   Date of Onset 03/18/20    Type of Study Bedside Swallow Evaluation    Previous Swallow Assessment None on record    Diet Prior to this Study Regular;Thin liquids    Temperature Spikes Noted No    Respiratory Status Room air    History of Recent Intubation No     Behavior/Cognition Alert;Cooperative;Pleasant mood    Oral Cavity Assessment Within Functional Limits    Oral Care Completed by SLP No    Oral Cavity - Dentition Dentures, top;Dentures, bottom    Vision Functional for self-feeding    Self-Feeding Abilities Able to feed self    Patient Positioning Upright in chair  Baseline Vocal Quality Normal    Volitional Cough Strong    Volitional Swallow Able to elicit            Oral Motor/Sensory Function - 05/11/20 1322      Oral Motor/Sensory Function   Overall Oral Motor/Sensory Function Mild impairment    Lingual Strength Reduced           Ice Chips - 05/11/20 1322      Ice Chips   Ice chips Not tested           Thin Liquid - 05/11/20 1322      Thin Liquid   Thin Liquid Impaired    Presentation Cup;Self Fed    Pharyngeal  Phase Impairments Wet Vocal Quality    Other Comments min wet vocal quality             Puree - 05/11/20 1323      Puree   Puree Within functional limits    Presentation Self Fed;Spoon           Solid - 05/11/20 1323      Solid   Solid Within functional limits    Presentation Self Fed               SLP Education - 05/11/20 1320    Education Details Recommend MBSS    Person(s) Educated Patient    Methods Explanation    Comprehension Verbalized understanding            SLP Short Term Goals - 05/11/20 1324      SLP SHORT TERM GOAL #1   Title Recommend MBSS to objectively evaluate swallow due to wet vocal quality, drooling, and self report of difficulty swallowing.              Plan - 05/11/20 1323    Clinical Impression Statement Clinical swallow evaluation and cognitive linguistic evaluation completed. Pt was accompanied to the evaluation by his good friend and neighbor, [name]. Pt presents with mild dysarthria and suspected mild oropharyngeal dysphagia. The SLUMS was administered as well as other informal measures, to evaluate cognitive linguistic skills and Pt  scored a 15/30 with deficits in mental calculations, short term memory (3/5 words recalled independently), and attention. Pt and friend indicated that this likely reflects his baseline given his 9th grade education. Pt is also hard of hearing, but does wear hearing aids. He has no difficulty managing his medications, finances, driving, and home tasks independently per Pt and friend. Pt is only concerned about the change in his speech and difficulty managing oral secretions. Pt consumed self presented water, puree, and peanut butter crackers in session. Pt with min wet vocal quality, but no overt coughing or reports of globus. Although drooling was not observed today, Pt reports frequent drooling on the right side of his mouth. Pt was given information regarding aspiration precautions and speech intelligibility strategies. He was encouraged to swallow more frequently (goal of every 30 seconds) to clear pooled secretions. Recommend objective swallow assessment (MBSS) to further evaluate swallow and make necessary recommendations. Pt is in agreement with plan of care. Pt also noted to have white coating on tongue and he will notify his MD if it does not clear with standard toothbrush oral care. Will try arrange MBSS for next week if radiology can accommodate.    Treatment/Interventions Other (comment)   MBSS   Consulted and Agree with Plan of Care Patient           Patient  will benefit from skilled therapeutic intervention in order to improve the following deficits and impairments:   Cognitive communication deficit  Dysphagia, oropharyngeal phase    Problem List Patient Active Problem List   Diagnosis Date Noted  . Erectile dysfunction 01/21/2020  . PVC's (premature ventricular contractions) 12/24/2012  . CARDIOMYOPATHY 12/19/2009  . CHRONIC SYSTOLIC HEART FAILURE 85/50/1586  . Automatic implantable cardioverter-defibrillator in situ 12/19/2009   Thank you,  Genene Churn,  [place]  Genene Churn 05/11/2020, 1:26 PM  [facility] [address] [place], [place], [address] Phone: [phone]   Fax:  [phone]  Name: KIONTE BAUMGARDNER MRN: [id number] Date of Birth: 1934/10/24

## 2020-05-15 ENCOUNTER — Other Ambulatory Visit (HOSPITAL_COMMUNITY): Payer: Self-pay | Admitting: Specialist

## 2020-05-15 DIAGNOSIS — R471 Dysarthria and anarthria: Secondary | ICD-10-CM

## 2020-05-15 DIAGNOSIS — K117 Disturbances of salivary secretion: Secondary | ICD-10-CM

## 2020-05-15 DIAGNOSIS — I6389 Other cerebral infarction: Secondary | ICD-10-CM

## 2020-05-15 DIAGNOSIS — G459 Transient cerebral ischemic attack, unspecified: Secondary | ICD-10-CM

## 2020-05-15 DIAGNOSIS — R1319 Other dysphagia: Secondary | ICD-10-CM

## 2020-05-16 ENCOUNTER — Ambulatory Visit (HOSPITAL_COMMUNITY): Payer: Medicare Other | Admitting: Speech Pathology

## 2020-05-23 ENCOUNTER — Ambulatory Visit (INDEPENDENT_AMBULATORY_CARE_PROVIDER_SITE_OTHER): Payer: Medicare Other | Admitting: *Deleted

## 2020-05-23 DIAGNOSIS — I428 Other cardiomyopathies: Secondary | ICD-10-CM | POA: Diagnosis not present

## 2020-05-23 LAB — CUP PACEART REMOTE DEVICE CHECK
Battery Remaining Longevity: 44 mo
Battery Voltage: 2.97 V
Brady Statistic AP VP Percent: 1.21 %
Brady Statistic AP VS Percent: 98.56 %
Brady Statistic AS VP Percent: 0 %
Brady Statistic AS VS Percent: 0.22 %
Brady Statistic RA Percent Paced: 99.77 %
Brady Statistic RV Percent Paced: 1.22 %
Date Time Interrogation Session: 20210706022604
HighPow Impedance: 42 Ohm
HighPow Impedance: 53 Ohm
Implantable Lead Implant Date: 20090204
Implantable Lead Implant Date: 20090204
Implantable Lead Location: 753859
Implantable Lead Location: 753860
Implantable Lead Model: 5076
Implantable Lead Model: 6947
Implantable Pulse Generator Implant Date: 20161130
Lead Channel Impedance Value: 323 Ohm
Lead Channel Impedance Value: 399 Ohm
Lead Channel Impedance Value: 456 Ohm
Lead Channel Pacing Threshold Amplitude: 0.875 V
Lead Channel Pacing Threshold Amplitude: 0.875 V
Lead Channel Pacing Threshold Pulse Width: 0.4 ms
Lead Channel Pacing Threshold Pulse Width: 0.4 ms
Lead Channel Sensing Intrinsic Amplitude: 1.375 mV
Lead Channel Sensing Intrinsic Amplitude: 1.375 mV
Lead Channel Sensing Intrinsic Amplitude: 4.125 mV
Lead Channel Sensing Intrinsic Amplitude: 4.125 mV
Lead Channel Setting Pacing Amplitude: 2 V
Lead Channel Setting Pacing Amplitude: 2.5 V
Lead Channel Setting Pacing Pulse Width: 0.4 ms
Lead Channel Setting Sensing Sensitivity: 0.3 mV

## 2020-05-24 NOTE — Progress Notes (Signed)
Remote ICD transmission.   

## 2020-05-25 ENCOUNTER — Encounter (HOSPITAL_COMMUNITY): Payer: Self-pay | Admitting: Speech Pathology

## 2020-05-25 ENCOUNTER — Ambulatory Visit (HOSPITAL_COMMUNITY)
Admission: RE | Admit: 2020-05-25 | Discharge: 2020-05-25 | Disposition: A | Discharge status: Home / Self Care | Payer: Medicare Other | Source: Ambulatory Visit | Attending: Family Medicine | Admitting: Family Medicine

## 2020-05-25 ENCOUNTER — Other Ambulatory Visit: Payer: Self-pay

## 2020-05-25 ENCOUNTER — Ambulatory Visit (HOSPITAL_COMMUNITY): Payer: Medicare Other | Attending: Family Medicine | Admitting: Speech Pathology

## 2020-05-25 DIAGNOSIS — R471 Dysarthria and anarthria: Secondary | ICD-10-CM

## 2020-05-25 DIAGNOSIS — G459 Transient cerebral ischemic attack, unspecified: Secondary | ICD-10-CM | POA: Insufficient documentation

## 2020-05-25 DIAGNOSIS — K117 Disturbances of salivary secretion: Secondary | ICD-10-CM | POA: Diagnosis present

## 2020-05-25 DIAGNOSIS — R1319 Other dysphagia: Secondary | ICD-10-CM | POA: Diagnosis not present

## 2020-05-25 DIAGNOSIS — R1312 Dysphagia, oropharyngeal phase: Secondary | ICD-10-CM | POA: Diagnosis not present

## 2020-05-25 DIAGNOSIS — I6389 Other cerebral infarction: Secondary | ICD-10-CM | POA: Diagnosis present

## 2020-05-25 NOTE — Therapy (Signed)
[facility], [place], [address] Phone: [phone]   Fax:  [phone]  Modified Barium Swallow  Patient Details  Name: [name] MRN: [id number] Date of Birth: 1934-01-08 Referring Provider (SLP): Consuello Masse, MD   Encounter Date: 05/25/2020   End of Session - 05/25/20 1306    Visit Number 2    Number of Visits 6    Authorization Type Medicare    SLP Start Time 1130    SLP Stop Time  1202    SLP Time Calculation (min) 32 min    Activity Tolerance Patient tolerated treatment well           Past Medical History:  Diagnosis Date  . Automatic implantable cardiac defibrillator in situ    MDT, implanted 2009  . CAD (coronary artery disease)    minimal by cath  . CHF NYHA class II ([place])   . DCM (dilated cardiomyopathy) ([place])     Past Surgical History:  Procedure Laterality Date  . CARDIAC CATHETERIZATION  12/17/07  . EP IMPLANTABLE DEVICE N/A 10/18/2015   Procedure: ICD Generator Changeout;  Surgeon: Evans Lance, MD;  Location: [facility];  Service: Cardiovascular;  Laterality: N/A;  . S/P ICD  2009    There were no vitals filed for this visit.   Subjective Assessment - 05/25/20 1252    Subjective "I still have to wipe the drool off my face."    Patient is accompained by: --   friend, Florencia Reasons   Special Tests MBSS    Currently in Pain? No/denies             General - 05/25/20 1255      General Information   Date of Onset 03/18/20    HPI [name] is an 86 yo male who was referred by Dr. Consuello Masse for a cognitive linguistic evaluation and clinical swallow evaluation due to Pt with reports of changes in his speech (dysarthria) and swallowing. His head CT on 03/31/20 was negative for acute changes. No MRI. Pt will see Dr. Leonie Man (Neurology) in July. [name] lives alone and has a 9th grade education. Family and friends live nearby. MBSS was recommended after clinical swallow  evaluation on 05/11/20    Type of Study MBS-Modified Barium Swallow Study    Previous Swallow Assessment clinical swallow evaluation 05/11/20    Diet Prior to this Study Regular;Thin liquids    Temperature Spikes Noted No    Respiratory Status Room air    History of Recent Intubation No    Behavior/Cognition Alert;Cooperative;Pleasant mood    Oral Care Completed by SLP No    Oral Cavity - Dentition Dentures, top;Dentures, bottom   loose fitting lower dentures   Vision Functional for self feeding    Self-Feeding Abilities Able to feed self    Patient Positioning Upright in chair    Baseline Vocal Quality Normal;Wet    Volitional Cough Strong    Volitional Swallow Able to elicit    Anatomy Within functional limits    Pharyngeal Secretions Not observed secondary MBS              Oral Preparation/Oral Phase - 05/25/20 1256      Oral Preparation/Oral Phase   Oral Phase Impaired      Oral - Thin   Oral - Thin Cup Decreased bolus cohesion      Oral - Solids   Oral - Puree Decreased bolus cohesion  Electrical stimulation - Oral Phase   Was Electrical Stimulation Used No            Pharyngeal Phase - 05/25/20 1257      Pharyngeal Phase   Pharyngeal Phase Impaired      Pharyngeal - Nectar   Pharyngeal- Nectar Cup Swallow initiation at vallecula;Reduced epiglottic inversion;Pharyngeal residue - valleculae;Pharyngeal residue - pyriform      Pharyngeal - Thin   Pharyngeal- Thin Teaspoon Swallow initiation at vallecula;Swallow initiation at pyriform sinus;Reduced epiglottic inversion;Reduced airway/laryngeal closure;Penetration/Aspiration before swallow;Penetration/Aspiration during swallow;Trace aspiration;Pharyngeal residue - valleculae;Pharyngeal residue - pyriform    Pharyngeal Material enters airway, passes BELOW cords then ejected out    Pharyngeal- Thin Cup Swallow initiation at vallecula;Swallow initiation at pyriform sinus;Reduced epiglottic  inversion;Penetration/Aspiration before swallow;Reduced airway/laryngeal closure;Penetration/Aspiration during swallow;Trace aspiration;Pharyngeal residue - valleculae;Pharyngeal residue - pyriform    Pharyngeal Material enters airway, passes BELOW cords without attempt by patient to eject out (silent aspiration)    Pharyngeal- Thin Straw Swallow initiation at pyriform sinus;Reduced epiglottic inversion;Reduced airway/laryngeal closure;Penetration/Aspiration during swallow    Pharyngeal Material enters airway, remains ABOVE vocal cords then ejected out      Pharyngeal - Solids   Pharyngeal- Puree Swallow initiation at vallecula;Reduced epiglottic inversion;Reduced tongue base retraction;Pharyngeal residue - valleculae    Pharyngeal- Regular Delayed swallow initiation-vallecula;Reduced epiglottic inversion;Reduced tongue base retraction;Pharyngeal residue - valleculae    Pharyngeal- Pill --   unwitnessed trace aspiration of thin when taking pill     Pharyngeal Phase - Comment   Pharyngeal Comment reduced tongue base retraction, epiglottic deflection, and vocal fold closure      Electrical Stimulation - Pharyngeal Phase   Was Electrical Stimulation Used No            Cricopharyngeal Phase - 05/25/20 1304      Cervical Esophageal Phase   Cervical Esophageal Phase Impaired      Cervical Esophageal Phase - Solids   Puree Esophageal backflow into cervical esophagus                SLP Short Term Goals - 05/25/20 1311      SLP SHORT TERM GOAL #1   Title Pt will complete pharyngeal strengthening exercises with 90% acc when provided with initial model and written cues.    Baseline Pt does not have exercises yet    Time 4    Period Weeks    Status New    Target Date 06/29/20      SLP SHORT TERM GOAL #2   Title Pt will swallow at least once every 30 seconds during a 30 minute time frame with min cues from SLP (and use of counter).    Baseline Pt needs mod reminders to swallow to  clear wet vocal quality    Time 4    Period Weeks    Target Date 06/29/20      SLP SHORT TERM GOAL #3   Title Pt will consume self regulated regular textures and thin liquids with use of strategies provided in written format with min cues from SLP in session.    Baseline mod cues for cough and repeat swallow    Time 4    Period Weeks    Status New    Target Date 06/29/20            SLP Long Term Goals - 05/25/20 1314      SLP LONG TERM GOAL #1   Title Pt will consume self regulated regular textures and thin liquids  with use of strategies to minimize risk of aspiration pneumonia.    Baseline mod assist for strategies    Time 1    Period Months    Status New    Target Date 06/29/20            Plan - 05/25/20 1308    Clinical Impression Statement Pt presents with mild/moderate oropharyngeal phase dysphagia characterized by weak lingual manipulation with reduced oral bolus cohesiveness, delay in swallow initiation, reduced tongue base retraction, epiglottic deflection, and laryngeal closure resulting in variable penetration and trace aspiration of thins before and during the swallow and mi/mod vallecular and min pyriform residue. Pt was sensate to one episode of aspiration and eventually produced a strong cough to remove from anterior tracheal wall. He was not sensate to aspiration down the posterior pharyngeal wall, however when cued to cough, he was able to clear. Recommend self regulated regular textures and thin liquids with use of strategies- small sips, swallow 2x for each bite/sip, cough periodically after sips of liquids and repeat/dry swallow, practice good oral care, and swallow more frequently when not eating. Pt does present with wet vocal quality and suspect he has difficulty managing his secretions likely due to reduced laryngeal closure (consider ENT consult to evaluate larynx). Also recommend dysphagia treatment to train strategies and provide pharyngeal strengthening  exercises. Pt is in agreement with plan of care.    Speech Therapy Frequency 4x / week    Duration 4 weeks    Treatment/Interventions Aspiration precaution training;SLP instruction and feedback;Diet toleration management by SLP;Patient/family education;Pharyngeal strengthening exercises;Compensatory techniques;Compensatory strategies    Potential to Achieve Goals Good    Potential Considerations Ability to learn/carryover information    SLP Home Exercise Plan Pt will complete HEP as assigned to facilitate carryover of treatment strategies and techniques in home environment    Consulted and Agree with Plan of Care Patient           Patient will benefit from skilled therapeutic intervention in order to improve the following deficits and impairments:   Dysphagia, oropharyngeal phase     Recommendations/Treatment - 05/25/20 1304      Swallow Evaluation Recommendations   Recommended Consults Consider ENT evaluation   to evaluate vocal folds if Pt desires   SLP Diet Recommendations Age appropriate regular;Thin    Liquid Administration via Cup    Medication Administration Whole meds with liquid    Supervision Patient able to self feed    Compensations Small sips/bites;Multiple dry swallows after each bite/sip;Hard cough after swallow    Postural Changes Seated upright at 90 degrees;Remain upright for at least 30 minutes after feeds/meals            Prognosis - 05/25/20 1305      Prognosis   Prognosis for Safe Diet Advancement Good      Individuals Consulted   Consulted and Agree with Results and Recommendations Patient    Report Sent to  Referring physician           Problem List Patient Active Problem List   Diagnosis Date Noted  . Erectile dysfunction 01/21/2020  . PVC's (premature ventricular contractions) 12/24/2012  . CARDIOMYOPATHY 12/19/2009  . CHRONIC SYSTOLIC HEART FAILURE 89/38/1017  . Automatic implantable cardioverter-defibrillator in situ 12/19/2009    Thank you,  Genene Churn, [place]  [place] 05/25/2020, 1:16 PM  [facility] [address] [place], [place], [address] Phone: [phone]   Fax:  [phone]  Name: Galileo  GRACIN MCPARTLAND MRN: [id number] Date of Birth: 08-03-1934

## 2020-05-30 ENCOUNTER — Other Ambulatory Visit: Payer: Self-pay

## 2020-05-30 ENCOUNTER — Ambulatory Visit (INDEPENDENT_AMBULATORY_CARE_PROVIDER_SITE_OTHER): Payer: Medicare Other | Admitting: Neurology

## 2020-05-30 ENCOUNTER — Encounter: Payer: Self-pay | Admitting: Neurology

## 2020-05-30 ENCOUNTER — Telehealth: Payer: Self-pay | Admitting: Neurology

## 2020-05-30 VITALS — BP 152/86 | HR 66 | Ht 75.0 in | Wt 168.0 lb

## 2020-05-30 DIAGNOSIS — I6389 Other cerebral infarction: Secondary | ICD-10-CM

## 2020-05-30 DIAGNOSIS — R471 Dysarthria and anarthria: Secondary | ICD-10-CM

## 2020-05-30 DIAGNOSIS — R1312 Dysphagia, oropharyngeal phase: Secondary | ICD-10-CM | POA: Diagnosis not present

## 2020-05-30 DIAGNOSIS — G1229 Other motor neuron disease: Secondary | ICD-10-CM

## 2020-05-30 DIAGNOSIS — G1222 Progressive bulbar palsy: Secondary | ICD-10-CM | POA: Insufficient documentation

## 2020-05-30 NOTE — Progress Notes (Signed)
[facility] [address] [place]. [place] [address] [phone]       OFFICE CONSULT NOTE  [name] Date of Birth:  25-Jan-1934 Medical Record Number:  [id number]   Referring MD: Ena Dawley  Reason for Referral: Speech difficulties and trouble swallowing HPI: [name] is a 86 year old Caucasian male with past medical history of chronic systolic congestive heart failure, nonischemic cardiomyopathy, autonomic dysfunction who is accompanied today by his friend Ronalee Belts who is also his caregiver. History is obtained from them, review of electronic medical records and have reviewed available imaging films in PACS. He states for the last 3 months or so has had gradual worsening of his speech which has become slurred and difficult to understand. When he is having conversations over the phone of the people can barely understand what he saying. He is also noticed increased drooling of saliva as well as some trouble swallowing. He denies any pain in his neck or a mechanical obstruction when he swallows. He had modified barium swallow done recently on 05/25/2020 which showed mild to moderate oropharyngeal phase dysphagia with weak manipulation of the tongue and delay in swallow initiation and reduced tongue base retraction. Patient denies any fasciculations or twitching's in his extremities or weakness in his arms legs. He does admit to having noticed some imbalance and dizziness particularly when he first stands up but this is transient and resolves in few seconds. At times he has noticed that he trips easily though he has had no major falls or injuries. He does have a history of a major neck injury from a fall 30 years ago and had pain in the neck and difficulty walking for quite some time but he improved. Patient has not noticed any emotional lability with inappropriate laughter or crying. Used to work as a Animator denies exposure to heavy metals or toxins. Does not drink  alcohol or smoke. He has no prior history of strokes TIAs seizures or significant neurological problems. Review of electronic medical records show that he was seen in the ER for these complaints and had CT angiogram of the brain and neck done on 03/31/2020 which shows moderate right M2 stenosis and mild irregularity of the left P3 segment of posterior cerebral artery. There were no significant extracranial stenosis noted. Patient has a pacemaker and MRI cannot be done. He denies significant paresthesias or pain in his hands or feet.  ROS:   14 system review of systems is positive for speech difficulties, difficulty swallowing, imbalance, tripping, staggering, dizziness and all other systems negative  PMH:  Past Medical History:  Diagnosis Date  . Automatic implantable cardiac defibrillator in situ    MDT, implanted 2009  . CAD (coronary artery disease)    minimal by cath  . CHF NYHA class II ([place])   . DCM (dilated cardiomyopathy) ([place])     Social History:  Social History   Socioeconomic History  . Marital status: Married    Spouse name: Not on file  . Number of children: Not on file  . Years of education: Not on file  . Highest education level: Not on file  Occupational History  . Not on file  Tobacco Use  . Smoking status: Never Smoker  . Smokeless tobacco: Never Used  Vaping Use  . Vaping Use: Never used  Substance and Sexual Activity  . Alcohol use: No  . Drug use: Never  . Sexual activity: Not on file  Other Topics Concern  . Not on file  Social History Narrative   Retired.    Social Determinants of Health   Financial Resource Strain:   . Difficulty of Paying Living Expenses:   Food Insecurity:   . Worried About Charity fundraiser in the Last Year:   . Arboriculturist in the Last Year:   Transportation Needs:   . Film/video editor (Medical):   Marland Kitchen Lack of Transportation (Non-Medical):   Physical Activity:   . Days of Exercise per Week:   . Minutes of  Exercise per Session:   Stress:   . Feeling of Stress :   Social Connections:   . Frequency of Communication with Friends and Family:   . Frequency of Social Gatherings with Friends and Family:   . Attends Religious Services:   . Active Member of Clubs or Organizations:   . Attends Archivist Meetings:   Marland Kitchen Marital Status:   Intimate Partner Violence:   . Fear of Current or Ex-Partner:   . Emotionally Abused:   Marland Kitchen Physically Abused:   . Sexually Abused:     Medications:   Current Outpatient Medications on File Prior to Visit  Medication Sig Dispense Refill  . amiodarone (PACERONE) 200 MG tablet Take one tablet by mouth daily Monday through Saturday.  Do NOT take on Sunday. 90 tablet 3  . atorvastatin (LIPITOR) 10 MG tablet     . finasteride (PROSCAR) 5 MG tablet Take 1 tablet by mouth daily.    Marland Kitchen levothyroxine (SYNTHROID, LEVOTHROID) 50 MCG tablet Take 50 mcg by mouth daily.      Marland Kitchen losartan (COZAAR) 25 MG tablet Take 25 mg by mouth daily.    . metoprolol tartrate (LOPRESSOR) 25 MG tablet Take 25 mg by mouth 2 (two) times daily.     . Tamsulosin HCl (FLOMAX) 0.4 MG CAPS Take 0.4 mg by mouth daily.     No current facility-administered medications on file prior to visit.    Allergies:  No Known Allergies  Physical Exam General: well developed, well nourished, seated, in no evident distress Head: head normocephalic and atraumatic.   Neck: supple with no carotid or supraclavicular bruits Cardiovascular: regular rate and rhythm, no murmurs Musculoskeletal: no deformity Skin:  no rash/petichiae pacemaker noted in the left infraclavicular region. Vascular:  Normal pulses all extremities  Neurologic Exam Mental Status: Awake and fully alert. Oriented to place and time. Recent and remote memory intact. Attention span, concentration and fund of knowledge appropriate. Mood and affect appropriate. Speech is dysarthric with pseudobulbar quality. Decreased facial expression. Jaw  jerk is brisk. Tongue appears to be atrophied with few fasciculations. Cranial Nerves: Fundoscopic exam reveals sharp disc margins. Pupils equal, briskly reactive to light. Extraocular movements full without nystagmus. Visual fields full to confrontation. Hearing intact. Facial sensation intact. Face, tongue, palate moves normally and symmetrically.  Motor: Normal bulk and tone. Normal strength in all tested extremity muscles. No visible fasciculations are noted over the trunk or extremities.  Sensory.: intact to touch , pinprick , position and vibratory sensation.  Coordination: Rapid alternating movements normal in all extremities. Finger-to-nose and heel-to-shin performed accurately bilaterally. Gait and Station: Arises from chair without difficulty. Stance is normal. Gait demonstrates normal stride length and balance . Able to heel, toe and tandem walk without difficulty.  Reflexes: 2+ brisk and symmetric. Toes downgoing.       ASSESSMENT: 86 year old Caucasian male with subacute slurred speech and swallowing difficulties with neurological exam suggesting pseudobulbar palsy. Possibilities do include progressive bulbar  palsy versus bilateral subcortical infarcts or compressive craniovertebral junction lesion.     PLAN: I had a long discussion with the patient and his friend Ronalee Belts regarding his subacute dysarthria and dysphagia and discussed differential diagnosis and answered questions.  Recommend checking CT scan of the head with and without contrast to rule out strokes as well as CT scan of the cervical spine to rule out any compressive cervical spine disease. MRI cannot be obtained because he has incompatible pacemaker. Check EMG nerve conduction study for bulbar palsy.  Check comprehensive metabolic panel, CBC, J24, TSH.ANA,, ESR, zinc, copper, serum protein electrophoresis.  He was advised to follows dysphagia precautions as advised by speech therapy. Greater than 50% time during this  45-minute consultation visit was spent on counseling and coordination of care about his dysarthria and dysphagia and discussion about differential diagnosis and evaluation and treatment plan and answering questions. Return for follow-up in 2 months or call earlier if necessary Antony Contras, MD  [facility] [address] [place], [place] [address]  Phone [phone] Fax [phone] Note: This document was prepared with digital dictation and possible smart phrase technology. Any transcriptional errors that result from this process are unintentional.

## 2020-05-30 NOTE — Patient Instructions (Addendum)
I had a long discussion with the patient and his friend Ronalee Belts regarding his subacute dysarthria and dysphagia and discussed differential diagnosis and answered questions.  Recommend checking CT scan of the head with and without contrast to rule out strokes as well as CT scan of the cervical spine to rule out any compressive cervical spine disease.  Check EMG nerve conduction study for bulbar palsy.  Check comprehensive metabolic panel, CBC, L73, TSH.ANA,, ESR, zinc, copper, serum protein electrophoresis.  He was advised to follows dysphagia precautions as advised by speech therapy.  Return for follow-up in 2 months or call earlier if necessary

## 2020-05-30 NOTE — Telephone Encounter (Signed)
medicare/bankers of life order sent to [facility]. No auth they will reach out to the patient to schedule.

## 2020-06-01 ENCOUNTER — Ambulatory Visit (HOSPITAL_COMMUNITY): Payer: Medicare Other | Admitting: Speech Pathology

## 2020-06-01 ENCOUNTER — Other Ambulatory Visit: Payer: Self-pay

## 2020-06-01 ENCOUNTER — Encounter (HOSPITAL_COMMUNITY): Payer: Self-pay | Admitting: Speech Pathology

## 2020-06-01 DIAGNOSIS — R1312 Dysphagia, oropharyngeal phase: Secondary | ICD-10-CM

## 2020-06-01 NOTE — Therapy (Signed)
[name], [place], [address] Phone: [phone]   Fax:  [phone]  Speech Language Pathology Treatment  Patient Details  Name: [name] MRN: [id number] Date of Birth: 06-01-1934 Referring Provider (SLP): Consuello Masse, MD   Encounter Date: 06/01/2020   End of Session - 06/01/20 1054    Visit Number 3    Number of Visits 6    Authorization Type Medicare    SLP Start Time 9741    SLP Stop Time  6384    SLP Time Calculation (min) 45 min    Activity Tolerance Patient tolerated treatment well           Past Medical History:  Diagnosis Date  . Automatic implantable cardiac defibrillator in situ    MDT, implanted 2009  . CAD (coronary artery disease)    minimal by cath  . CHF NYHA class II ([place])   . DCM (dilated cardiomyopathy) ([place])     Past Surgical History:  Procedure Laterality Date  . CARDIAC CATHETERIZATION  12/17/07  . EP IMPLANTABLE DEVICE N/A 10/18/2015   Procedure: ICD Generator Changeout;  Surgeon: Evans Lance, MD;  Location: [facility];  Service: Cardiovascular;  Laterality: N/A;  . S/P ICD  2009    There were no vitals filed for this visit.   Subjective Assessment - 06/01/20 1048    Subjective "I don't drool at night when I am sleeping."    Currently in Pain? No/denies            ADULT SLP TREATMENT - 06/01/20 1049      General Information   Behavior/Cognition Alert;Cooperative;Pleasant mood    Patient Positioning Upright in chair    Oral care provided N/A    HPI [name] is an 86 yo male who was referred by Dr. Consuello Masse for a cognitive linguistic evaluation and clinical swallow evaluation due to Pt with reports of changes in his speech (dysarthria) and swallowing. His head CT on 03/31/20 was negative for acute changes. No MRI. Pt will see Dr. Leonie Man (Neurology) in July. Mr. Tooker lives alone and has a 9th grade education. Family and friends live nearby. MBSS  was recommended after clinical swallow evaluation on 05/11/20. MBSS completed last week with recommendations below.      Treatment Provided   Treatment provided Dysphagia      Dysphagia Treatment   Temperature Spikes Noted No    Respiratory Status Room air    Oral Cavity - Dentition Dentures, top;Dentures, bottom    Treatment Methods Skilled observation;Therapeutic exercise;Compensation strategy training;Patient/caregiver education    Patient observed directly with PO's Yes    Type of PO's observed Thin liquids    Feeding Able to feed self    Liquids provided via Cup   water bottle   Type of cueing Verbal    Amount of cueing Modified independent      Pain Assessment   Pain Assessment No/denies pain      Assessment / Recommendations / Plan   Plan Discharge SLP treatment due to (comment)   Pt requests to be discharged to HEP     Dysphagia Recommendations   Diet recommendations Regular;Thin liquid    Liquids provided via Cup    Medication Administration Whole meds with liquid    Supervision Patient able to self feed    Compensations Slow rate;Small sips/bites;Multiple dry swallows after each bite/sip;Clear throat intermittently;Effortful swallow    Postural Changes and/or Swallow Maneuvers  Out of bed for meals;Seated upright 90 degrees;Upright 30-60 min after meal      General Recommendations   Oral Care Recommendations Oral care BID;Patient independent with oral care    Follow up Recommendations None      Progression Toward Goals   Progression toward goals Goals met, education completed, patient discharged from [facility] - 06/01/20 1054    Education Details compensatory swallow strategies and pharyngeal exercises provided    Person(s) Educated Patient    Methods Explanation;Handout;Demonstration    Comprehension Verbalized understanding;Returned demonstration            SLP Short Term Goals - 06/01/20 1055      SLP SHORT TERM GOAL #1   Title Pt  will complete pharyngeal strengthening exercises with 90% acc when provided with initial model and written cues.    Baseline Pt does not have exercises yet    Time 4    Period Weeks    Status Partially Met    Target Date 06/29/20      SLP SHORT TERM GOAL #2   Title Pt will swallow at least once every 30 seconds during a 30 minute time frame with min cues from SLP (and use of counter).    Baseline Pt needs mod reminders to swallow to clear wet vocal quality    Time 4    Period Weeks    Status Partially Met    Target Date 06/29/20      SLP SHORT TERM GOAL #3   Title Pt will consume self regulated regular textures and thin liquids with use of strategies provided in written format with min cues from SLP in session.    Baseline mod cues for cough and repeat swallow    Time 4    Period Weeks    Status Partially Met    Target Date 06/29/20            SLP Long Term Goals - 06/01/20 1055      SLP LONG TERM GOAL #1   Title Pt will consume self regulated regular textures and thin liquids with use of strategies to minimize risk of aspiration pneumonia.    Baseline mod assist for strategies    Time 1    Period Months    Status Partially Met            Plan - 06/01/20 1055    Clinical Impression Statement Pt accompanied to therapy by his friend. The MBSS was reviewed with Pt in addition to aspiration precautions and compensatory strategies. Pt continues to report drooling from right oral cavity. He needs reminders to swallow more frequently to reduce pooling of oral secretions. Pt was educated on pharyngeal swallowing exercises with a focus on effortful swallow, chin tuck against resistance, and swallowing 2x per minute. Pt was able to return demonstrated with visual and verbal cues provided.  Recommend self regulated regular textures and thin liquids with use of strategies- small sips, swallow 2x for each bite/sip, cough periodically after sips of liquids and repeat/dry swallow, practice  good oral care, and swallow more frequently when not eating. Consider ENT consult to evaluate vocal fold function. Pt wishes to be discharged from SLP therapy to pursue exercises on his own at home with assist from his friend. They were encouraged to contact me/MD should they have further questions or needs. Will d/c from SLP services at this time.   Treatment/Interventions Aspiration precaution  training;SLP instruction and feedback;Diet toleration management by SLP;Patient/family education;Pharyngeal strengthening exercises;Compensatory techniques;Compensatory strategies    Potential to Achieve Goals Good    Potential Considerations Ability to learn/carryover information    SLP Home Exercise Plan Pt will complete HEP as assigned to facilitate carryover of treatment strategies and techniques in home environment    Consulted and Agree with Plan of Care Patient           Patient will benefit from skilled therapeutic intervention in order to improve the following deficits and impairments:   Dysphagia, oropharyngeal phase    Problem List Patient Active Problem List   Diagnosis Date Noted  . Erectile dysfunction 01/21/2020  . PVC's (premature ventricular contractions) 12/24/2012  . CARDIOMYOPATHY 12/19/2009  . CHRONIC SYSTOLIC HEART FAILURE 51/98/2429  . Automatic implantable cardioverter-defibrillator in situ 12/19/2009    SPEECH THERAPY DISCHARGE SUMMARY  Visits from Start of Care: 3  Current functional level related to goals / functional outcomes: See above, Pt wishes to be discharged to home program   Remaining deficits: Dysphagia persists   Education / Equipment: HEP provided.  Plan: Patient agrees to discharge.  Patient goals were partially met. Patient is being discharged due to being pleased with the current functional level.  ?????        Thank you,  Genene Churn, [place]  [facility] 06/01/2020, 10:56 AM  [facility] [address] [place], [place], [address] Phone: [phone]   Fax:  [phone]   Name: STOY FENN MRN: [id number] Date of Birth: [birth year]

## 2020-06-02 ENCOUNTER — Ambulatory Visit
Admission: RE | Admit: 2020-06-02 | Discharge: 2020-06-02 | Disposition: A | Discharge status: Home / Self Care | Payer: Medicare Other | Source: Ambulatory Visit | Attending: Neurology | Admitting: Neurology

## 2020-06-02 DIAGNOSIS — M542 Cervicalgia: Secondary | ICD-10-CM | POA: Diagnosis not present

## 2020-06-02 DIAGNOSIS — R471 Dysarthria and anarthria: Secondary | ICD-10-CM | POA: Diagnosis not present

## 2020-06-02 DIAGNOSIS — R1312 Dysphagia, oropharyngeal phase: Secondary | ICD-10-CM

## 2020-06-02 DIAGNOSIS — R1310 Dysphagia, unspecified: Secondary | ICD-10-CM | POA: Diagnosis not present

## 2020-06-02 MED ORDER — IOPAMIDOL (ISOVUE-300) INJECTION 61%
75.0000 mL | Freq: Once | INTRAVENOUS | Status: AC | PRN
  Administered 2020-06-02: 75 mL via INTRAVENOUS

## 2020-06-04 NOTE — Progress Notes (Signed)
Kindly inform the patient that comprehensive metabolic panel labs, serum protein look pheresis, CBC count, vitamin D and B1 were all normal.  He had mildly elevated T4 and ANA test was positive which is likely false positive as he has no clinical signs of lupus.  Advise him to discuss his thyroid results with his primary care physician.

## 2020-06-05 ENCOUNTER — Ambulatory Visit (INDEPENDENT_AMBULATORY_CARE_PROVIDER_SITE_OTHER): Payer: Medicare Other

## 2020-06-05 ENCOUNTER — Other Ambulatory Visit: Payer: Self-pay | Admitting: Neurology

## 2020-06-05 DIAGNOSIS — I5022 Chronic systolic (congestive) heart failure: Secondary | ICD-10-CM

## 2020-06-05 DIAGNOSIS — Z95810 Presence of automatic (implantable) cardiac defibrillator: Secondary | ICD-10-CM | POA: Diagnosis not present

## 2020-06-05 DIAGNOSIS — R1312 Dysphagia, oropharyngeal phase: Secondary | ICD-10-CM

## 2020-06-05 NOTE — Progress Notes (Signed)
Kindly inform inform the patient that CT scan of the neck soft tissue shows no major findings of mechanical blockages to his swallowing tubes. He has mild evidence of tonsillitis on the right and a small cyst on the left. I'm going to order CT scan of the cervical spine to look for any bony compression

## 2020-06-06 ENCOUNTER — Telehealth: Payer: Self-pay | Admitting: Neurology

## 2020-06-06 LAB — COMPREHENSIVE METABOLIC PANEL
ALT: 30 IU/L (ref 0–44)
AST: 27 IU/L (ref 0–40)
Albumin/Globulin Ratio: 1.8 (ref 1.2–2.2)
Albumin: 4.2 g/dL (ref 3.6–4.6)
Alkaline Phosphatase: 101 IU/L (ref 48–121)
BUN/Creatinine Ratio: 15 (ref 10–24)
BUN: 18 mg/dL (ref 8–27)
Bilirubin Total: 0.7 mg/dL (ref 0.0–1.2)
CO2: 24 mmol/L (ref 20–29)
Calcium: 9.1 mg/dL (ref 8.6–10.2)
Chloride: 100 mmol/L (ref 96–106)
Creatinine, Ser: 1.17 mg/dL (ref 0.76–1.27)
GFR calc Af Amer: 66 mL/min/{1.73_m2} (ref >59)
GFR calc non Af Amer: 57 mL/min/{1.73_m2} — ABNORMAL LOW (ref >59)
Globulin, Total: 2.4 g/dL (ref 1.5–4.5)
Glucose: 92 mg/dL (ref 65–99)
Potassium: 5.1 mmol/L (ref 3.5–5.2)
Sodium: 137 mmol/L (ref 134–144)
Total Protein: 6.6 g/dL (ref 6.0–8.5)

## 2020-06-06 LAB — CBC
Hematocrit: 44.3 % (ref 37.5–51.0)
Hemoglobin: 15 g/dL (ref 13.0–17.7)
MCH: 30.9 pg (ref 26.6–33.0)
MCHC: 33.9 g/dL (ref 31.5–35.7)
MCV: 91 fL (ref 79–97)
Platelets: 303 10*3/uL (ref 150–450)
RBC: 4.86 x10E6/uL (ref 4.14–5.80)
RDW: 12.2 % (ref 11.6–15.4)
WBC: 8 10*3/uL (ref 3.4–10.8)

## 2020-06-06 LAB — VITAMIN D 1,25 DIHYDROXY
Vitamin D 1, 25 (OH)2 Total: 43 pg/mL
Vitamin D2 1, 25 (OH)2: <10 pg/mL
Vitamin D3 1, 25 (OH)2: 43 pg/mL

## 2020-06-06 LAB — SEDIMENTATION RATE: Sed Rate: 2 mm/hr (ref 0–30)

## 2020-06-06 LAB — PROTEIN ELECTROPHORESIS, SERUM
A/G Ratio: 1.4 (ref 0.7–1.7)
Albumin ELP: 3.8 g/dL (ref 2.9–4.4)
Alpha 1: 0.2 g/dL (ref 0.0–0.4)
Alpha 2: 0.6 g/dL (ref 0.4–1.0)
Beta: 0.9 g/dL (ref 0.7–1.3)
Gamma Globulin: 1.1 g/dL (ref 0.4–1.8)
Globulin, Total: 2.8 g/dL (ref 2.2–3.9)

## 2020-06-06 LAB — ANA: Anti Nuclear Antibody (ANA): POSITIVE — AB

## 2020-06-06 LAB — T3: T3, Total: 94 ng/dL (ref 71–180)

## 2020-06-06 LAB — CERULOPLASMIN: Ceruloplasmin: 20.4 mg/dL (ref 16.0–31.0)

## 2020-06-06 LAB — TSH: TSH: 4.44 u[IU]/mL (ref 0.450–4.500)

## 2020-06-06 LAB — VITAMIN B1: Thiamine: 188.8 nmol/L (ref 66.5–200.0)

## 2020-06-06 LAB — T4: T4, Total: 12.8 ug/dL — ABNORMAL HIGH (ref 4.5–12.0)

## 2020-06-06 LAB — ZINC: Zinc: 80 ug/dL (ref 44–115)

## 2020-06-06 NOTE — Telephone Encounter (Signed)
Called the caregiver, reviewed the lab results with hi, reviewed the CT of the soft tissue results. Advised that Dr Leonie Man wants to order a CT of the neck to look to see if there is any problems with the spine/bones itself. He verbalized understanding and will be waiting to have that testing completed.

## 2020-06-06 NOTE — Telephone Encounter (Signed)
-----   Message from Garvin Fila, MD sent at 06/05/2020  4:23 PM EDT ----- [name] inform inform the patient that CT scan of the neck soft tissue shows no major findings of mechanical blockages to his swallowing tubes. He has mild evidence of tonsillitis on the right and a small cyst on the left. I'm going to order CT scan of the cervical spine to look for any bony compression

## 2020-06-06 NOTE — Progress Notes (Signed)
Kindly inform the patient that CT scan of the head shows mild age-related changes of shrinkage of the brain and hardening of the arteries. No worrisome findings

## 2020-06-07 NOTE — Progress Notes (Signed)
EPIC Encounter for ICM Monitoring  Patient Name: KEITHEN CAPO is a 86 y.o. male Date: 06/07/2020 Primary Care Physican: Manon Hilding, MD Primary Cardiologist:Taylor Electrophysiologist: Lovena Le 3/5/2021OfficeWeight:171lbs   Attempted call to patient and caregiver answering phone stated patient cannot speak or hear. No DPR on file to speak with caregiver Ubaldo Glassing.   Optivol thoracic impedancenormal.   Recommendations:Unable to reach.    ICM Follow-up plan: No further ICM monthly follow up planned since patient unable to participate. Device clinic will continue91 day remote monitoring and scheduled for 08/21/2020.   Copy of ICM check sent to Dr.Taylor.  3 month ICM trend: 06/05/2020    1 Year ICM trend:       Rosalene Billings, RN 06/07/2020 10:09 AM

## 2020-06-08 ENCOUNTER — Ambulatory Visit (HOSPITAL_COMMUNITY): Payer: Medicare Other | Admitting: Speech Pathology

## 2020-06-12 ENCOUNTER — Other Ambulatory Visit: Payer: Self-pay | Admitting: Neurology

## 2020-06-12 DIAGNOSIS — R1312 Dysphagia, oropharyngeal phase: Secondary | ICD-10-CM

## 2020-06-15 ENCOUNTER — Ambulatory Visit (HOSPITAL_COMMUNITY): Payer: Medicare Other | Admitting: Speech Pathology

## 2020-06-22 ENCOUNTER — Ambulatory Visit (HOSPITAL_COMMUNITY): Payer: Medicare Other | Admitting: Speech Pathology

## 2020-06-28 ENCOUNTER — Telehealth: Payer: Self-pay | Admitting: *Deleted

## 2020-06-28 DIAGNOSIS — R1312 Dysphagia, oropharyngeal phase: Secondary | ICD-10-CM

## 2020-06-28 NOTE — Telephone Encounter (Signed)
Received call from [facility]  asking if CT cervical spine needs to be done because patient has had previous scans with bony descriptions reported. If it still needs to be done it needs to be done without contrast due to having 2 scans with contrast in past 3 months. They typically don't give contrast for a bony neck CT cervical scan. He is scheduled for this Friday. Wells Guiles is asking for a call back with MD's decision [phone].

## 2020-06-29 NOTE — Telephone Encounter (Signed)
Agree do noncontrast cervical CT scan for bony spine

## 2020-06-29 NOTE — Telephone Encounter (Signed)
Called [facility], spoke with [place], [place] and informed her of Dr Clydene Fake new order. She stated she would let Wells Guiles know ,asked for new order of CT without contrast. I advised will send new order. She  verbalized understanding, appreciation.

## 2020-06-30 ENCOUNTER — Other Ambulatory Visit: Payer: Self-pay

## 2020-06-30 ENCOUNTER — Ambulatory Visit
Admission: RE | Admit: 2020-06-30 | Discharge: 2020-06-30 | Disposition: A | Discharge status: Home / Self Care | Payer: Medicare Other | Source: Ambulatory Visit | Attending: Neurology | Admitting: Neurology

## 2020-06-30 DIAGNOSIS — J358 Other chronic diseases of tonsils and adenoids: Secondary | ICD-10-CM | POA: Diagnosis not present

## 2020-06-30 DIAGNOSIS — R1312 Dysphagia, oropharyngeal phase: Secondary | ICD-10-CM

## 2020-06-30 DIAGNOSIS — M4312 Spondylolisthesis, cervical region: Secondary | ICD-10-CM | POA: Diagnosis not present

## 2020-06-30 DIAGNOSIS — M4802 Spinal stenosis, cervical region: Secondary | ICD-10-CM | POA: Diagnosis not present

## 2020-06-30 DIAGNOSIS — M47812 Spondylosis without myelopathy or radiculopathy, cervical region: Secondary | ICD-10-CM | POA: Diagnosis not present

## 2020-07-04 ENCOUNTER — Encounter: Payer: Self-pay | Admitting: Neurology

## 2020-07-04 ENCOUNTER — Ambulatory Visit (INDEPENDENT_AMBULATORY_CARE_PROVIDER_SITE_OTHER): Payer: Medicare Other | Admitting: Neurology

## 2020-07-04 DIAGNOSIS — G1229 Other motor neuron disease: Secondary | ICD-10-CM | POA: Diagnosis not present

## 2020-07-04 DIAGNOSIS — Z0289 Encounter for other administrative examinations: Secondary | ICD-10-CM

## 2020-07-04 DIAGNOSIS — R471 Dysarthria and anarthria: Secondary | ICD-10-CM | POA: Diagnosis not present

## 2020-07-04 NOTE — Progress Notes (Signed)
Please refer to EMG and nerve conduction procedure note.  

## 2020-07-04 NOTE — Progress Notes (Signed)
[facility]    Nerve / Sites Muscle Latency Ref. Amplitude Ref. Rel Amp Segments Distance Velocity Ref. Area    ms ms mV mV %  cm m/s m/s mVms  R Median - APB     Wrist APB 4.1 ?4.4 4.1 ?4.0 100 Wrist - APB 7   13.1     Upper arm APB 9.4  4.1  98.2 Upper arm - Wrist 26 49 ?49 12.5  R Ulnar - ADM     Wrist ADM 3.1 ?3.3 7.2 ?6.0 100 Wrist - ADM 7   27.3     B.Elbow ADM 7.9  6.7  93.1 B.Elbow - Wrist 24 50 ?49 26.1     A.Elbow ADM 9.9  6.4  96.2 A.Elbow - B.Elbow 10 50 ?49 26.2         A.Elbow - Wrist      R Peroneal - EDB     Ankle EDB 6.1 ?6.5 2.4 ?2.0 100 Ankle - EDB 9   8.5     Fib head EDB 14.7  1.7  73.2 Fib head - Ankle 31 36 ?44 7.2     Pop fossa EDB 17.9  1.6  91.7 Pop fossa - Fib head 10 31 ?44 8.0         Pop fossa - Ankle      R Tibial - AH     Ankle AH 4.5 ?5.8 4.3 ?4.0 100 Ankle - AH 9   14.6     Pop fossa AH 17.5  2.5  57.2 Pop fossa - Ankle 42 32 ?41 10.6             SNC    Nerve / Sites Rec. Site Peak Lat Ref.  Amp Ref. Segments Distance    ms ms V V  cm  R Sural - Ankle (Calf)     Calf Ankle 4.5 ?4.4 2 ?6 Calf - Ankle 14  R Superficial peroneal - Ankle     Lat leg Ankle 4.2 ?4.4 6 ?6 Lat leg - Ankle 14  R Median - Orthodromic (Dig II, Mid palm)     Dig II Wrist 3.7 ?3.4 7 ?10 Dig II - Wrist 13  R Ulnar - Orthodromic, (Dig V, Mid palm)     Dig V Wrist 3.1 ?3.1 7 ?5 Dig V - Wrist 46             F  Wave    Nerve F Lat Ref.   ms ms  R Tibial - AH 66.5 ?56.0  R Ulnar - ADM 36.0 ?32.0

## 2020-07-04 NOTE — Procedures (Signed)
HISTORY:  [name] is an 86 year old gentleman with a several month history of onset of dysarthria and dysphagia.  The patient reports no weakness or numbness of the face, arms, or legs.  He has no double vision or ptosis.  He is being evaluated for the speech and swallowing deficits.  NERVE CONDUCTION STUDIES:  Nerve conduction studies were performed on the right upper extremity.  The distal motor latency for the right median nerve was prolonged with a borderline normal motor amplitude.  The distal motor latency motor amplitude for the right ulnar nerve were normal with normal nerve conduction velocity seen for the right median and ulnar nerves.  Right median sensory latency was prolonged, normal for the right ulnar nerve.  The right ulnar F-wave latency was slightly prolonged.  Nerve conduction studies were performed on the right lower extremity.  The distal motor latencies for the right peroneal and posterior tibial nerves were within normal limits with normal motor amplitudes for these nerves as well.  The nerve conduction velocities for the right peroneal and posterior tibial nerves were slowed with prolongation of the right sural sensory latency, normal for the right peroneal sensory latency.  The right posterior tibial F-wave latency was prolonged.  EMG STUDIES:  EMG study was performed on the right upper extremity:  The first dorsal interosseous muscle reveals 2 to 4 K units with full recruitment. No fibrillations or positive waves were noted. The abductor pollicis brevis muscle reveals 2 to 5 K units with decreased recruitment.  Fast firing units were seen.  No fibrillations or positive waves were noted. The extensor indicis proprius muscle reveals 1 to 3 K units with full recruitment. No fibrillations or positive waves were noted. The biceps muscle reveals 1 to 2 K units with full recruitment. No fibrillations or positive waves were noted. The triceps muscle reveals 2 to 4 K  units with full recruitment. No fibrillations or positive waves were noted. The anterior deltoid muscle reveals 2 to 3 K units with full recruitment. No fibrillations or positive waves were noted. The cervical paraspinal muscles were tested at 2 levels. No abnormalities of insertional activity were seen at either level tested. There was good relaxation.  EMG study was performed on the right lower extremity:  The tibialis anterior muscle reveals 2 to 4K motor units with full recruitment. No fibrillations or positive waves were seen. The peroneus tertius muscle reveals 2 to 4K motor units with decreased recruitment. No fibrillations or positive waves were seen. The medial gastrocnemius muscle reveals 1 to 3K motor units with decreased recruitment. 2+ positive waves were seen. The vastus lateralis muscle reveals 2 to 4K motor units with full recruitment. No fibrillations or positive waves were seen. The iliopsoas muscle reveals 2 to 4K motor units with full recruitment. No fibrillations or positive waves were seen. The biceps femoris muscle (long head) reveals 2 to 4K motor units with full recruitment. No fibrillations or positive waves were seen. The lumbosacral paraspinal muscles were tested at 3 levels, and revealed no abnormalities of insertional activity at all 3 levels tested. There was good relaxation.  A limited EMG evaluation was done on the right cranial nerve innervated muscles.  The frontalis muscle reveals 0.2 to 0.5 K units with full recruitment.  No fibrillations or positive waves were seen. The orbicularis oris muscle reveals 0.2 to 0.5 K units with full recruitment.  No fibrillations or positive waves were seen. The genioglossus muscle reveals 0.3 to 0.5 K units  with good recruitment.  No fibrillations or positive waves were noted.  No fasciculations were seen in any muscle tested.  IMPRESSION:  Nerve conduction studies done on the right upper and right lower extremities reveal  evidence of a sensorimotor peripheral neuropathy of mild to moderate severity.  There appears to be an overlying mild right carpal tunnel syndrome.  EMG evaluation of the right upper and right lower extremities shows no evidence of overlying radiculopathies.  A limited EMG of the cranial nerve innervated muscles shows no evidence of acute or chronic neuropathic denervation.  Jill Alexanders MD 07/04/2020 3:52 PM  [facility] [address] [place], [place] [address]  Phone [phone] Fax [phone]

## 2020-07-06 LAB — ACETYLCHOLINE RECEPTOR, BINDING: AChR Binding Ab, Serum: 0.15 nmol/L (ref 0.00–0.24)

## 2020-07-07 NOTE — Progress Notes (Signed)
Kindly inform the patient that CT scan of the cervical spine shows wear-and-tear changes in the lower cervical spine but no significant spinal stenosis.  CT scan does not show any explanation for his swallowing difficulties

## 2020-07-07 NOTE — Progress Notes (Signed)
Kindly inform the patient that EMG nerve conduction study shows evidence of a mild neuropathy there is nerve damage in the feet as well as an additional component of pinched nerve in the right wrist that is carpal tunnel

## 2020-07-10 ENCOUNTER — Telehealth: Payer: Self-pay | Admitting: *Deleted

## 2020-07-10 NOTE — Telephone Encounter (Signed)
Called caregiver, Ronalee Belts, LVM requesting call back for results.

## 2020-07-11 NOTE — Telephone Encounter (Signed)
Called caregiver, Ronalee Belts and informed patient's CT scan of the cervical spine shows wear-and-tear changes in the lower cervical spine but no significant spinal stenosis. CT scan does not show any explanation for his swallowing difficulties. The  EMG nerve conduction study shows evidence of a mild neuropathy there is nerve damage in the feet as well as an additional component of pinched nerve in the right wrist that is carpal tunnel.

## 2020-07-20 ENCOUNTER — Telehealth: Payer: Self-pay | Admitting: Internal Medicine

## 2020-07-20 NOTE — Telephone Encounter (Signed)
    STAT if patient feels like he/she is going to faint   1) Are you dizzy now? Yes  2) Do you feel faint or have you passed out? No  3) Do you have any other symptoms?   4) Have you checked your HR and BP (record if available)? Not available   Ronalee Belts caregiver is with Pt and he said, pt feel very weak and dizzy, according to the pt he feel like he's going to pass out every time he change position.

## 2020-07-20 NOTE — Telephone Encounter (Signed)
Patient's caregiver, Ronalee Belts, is returning call. He states he was unable to take patient's BP, however, he called 911 and the EMS reported his vitals are perfect. Per Ronalee Belts, the patient is feeling better and the EMS left. He is requesting a call back to further discuss.

## 2020-07-20 NOTE — Telephone Encounter (Signed)
Called requesting a manual transmission. Speaking Ronalee Belts (DPR), monitor gave code [phone]. Advised to unplug, plug back in and leave hand held on charger to charge. Will call back in 20 minutes.

## 2020-07-20 NOTE — Telephone Encounter (Signed)
Left detailed message for Ronalee Belts.  Advised to check Pt's blood pressure and return call to office.

## 2020-07-20 NOTE — Telephone Encounter (Signed)
Attempted to help with transmission, unsuccessful. Med[employer] tech support phone number given to patient. Sonia Baller, RN updated.

## 2020-07-21 NOTE — Telephone Encounter (Signed)
Called to follow-up regarding monitor status. No answer, LMOVM.

## 2020-07-25 NOTE — Telephone Encounter (Signed)
Pt to see GT on 07/28/2020

## 2020-07-28 ENCOUNTER — Encounter: Payer: Self-pay | Admitting: Internal Medicine

## 2020-07-28 ENCOUNTER — Ambulatory Visit (INDEPENDENT_AMBULATORY_CARE_PROVIDER_SITE_OTHER): Payer: Medicare Other | Admitting: Internal Medicine

## 2020-07-28 ENCOUNTER — Other Ambulatory Visit: Payer: Self-pay

## 2020-07-28 VITALS — BP 134/76 | HR 81 | Ht 75.0 in | Wt 170.0 lb

## 2020-07-28 DIAGNOSIS — Z95810 Presence of automatic (implantable) cardiac defibrillator: Secondary | ICD-10-CM

## 2020-07-28 DIAGNOSIS — I6389 Other cerebral infarction: Secondary | ICD-10-CM | POA: Diagnosis not present

## 2020-07-28 DIAGNOSIS — I5022 Chronic systolic (congestive) heart failure: Secondary | ICD-10-CM | POA: Diagnosis not present

## 2020-07-28 LAB — CUP PACEART INCLINIC DEVICE CHECK
Battery Remaining Longevity: 42 mo
Battery Voltage: 2.9 V
Brady Statistic AP VP Percent: 0.85 %
Brady Statistic AP VS Percent: 99.02 %
Brady Statistic AS VP Percent: 0 %
Brady Statistic AS VS Percent: 0.13 %
Brady Statistic RA Percent Paced: 99.68 %
Brady Statistic RV Percent Paced: 0.86 %
Date Time Interrogation Session: 20210910162333
HighPow Impedance: 49 Ohm
HighPow Impedance: 64 Ohm
Implantable Lead Implant Date: 20090204
Implantable Lead Implant Date: 20090204
Implantable Lead Location: 753859
Implantable Lead Location: 753860
Implantable Lead Model: 5076
Implantable Lead Model: 6947
Implantable Pulse Generator Implant Date: 20161130
Lead Channel Impedance Value: 380 Ohm
Lead Channel Impedance Value: 456 Ohm
Lead Channel Impedance Value: 494 Ohm
Lead Channel Pacing Threshold Amplitude: 0.875 V
Lead Channel Pacing Threshold Amplitude: 0.875 V
Lead Channel Pacing Threshold Pulse Width: 0.4 ms
Lead Channel Pacing Threshold Pulse Width: 0.4 ms
Lead Channel Sensing Intrinsic Amplitude: 1.625 mV
Lead Channel Sensing Intrinsic Amplitude: 1.625 mV
Lead Channel Sensing Intrinsic Amplitude: 3.625 mV
Lead Channel Sensing Intrinsic Amplitude: 4.75 mV
Lead Channel Setting Pacing Amplitude: 2 V
Lead Channel Setting Pacing Amplitude: 2.5 V
Lead Channel Setting Pacing Pulse Width: 0.4 ms
Lead Channel Setting Sensing Sensitivity: 0.3 mV

## 2020-07-28 NOTE — Progress Notes (Signed)
HPI [name] returns today for followup. He is a pleasant 86 yo man with a h/o non-ischemic CM, chronic systolic heart failure and VT s/p ICD insertion. He has developed weakness. He has developed difficulty with his speech. It has slowed. He mentation is good.  No Known Allergies   Current Outpatient Medications  Medication Sig Dispense Refill   amiodarone (PACERONE) 200 MG tablet Take one tablet by mouth daily Monday through Saturday.  Do NOT take on Sunday. 90 tablet 3   atorvastatin (LIPITOR) 10 MG tablet      finasteride (PROSCAR) 5 MG tablet Take 1 tablet by mouth daily.     levothyroxine (SYNTHROID, LEVOTHROID) 50 MCG tablet Take 50 mcg by mouth daily.       losartan (COZAAR) 25 MG tablet Take 25 mg by mouth daily.     metoprolol tartrate (LOPRESSOR) 25 MG tablet Take 25 mg by mouth 2 (two) times daily.      Tamsulosin HCl (FLOMAX) 0.4 MG CAPS Take 0.4 mg by mouth daily.     No current facility-administered medications for this visit.     Past Medical History:  Diagnosis Date   Automatic implantable cardiac defibrillator in situ    MDT, implanted 2009   CAD (coronary artery disease)    minimal by cath   CHF NYHA class II (HCC)    DCM (dilated cardiomyopathy) ([place])     ROS:   All systems reviewed and negative except as noted in the HPI.   Past Surgical History:  Procedure Laterality Date   CARDIAC CATHETERIZATION  12/17/07   EP IMPLANTABLE DEVICE N/A 10/18/2015   Procedure: ICD Generator Changeout;  Surgeon: Evans Lance, MD;  Location: [facility];  Service: Cardiovascular;  Laterality: N/A;   S/P ICD  2009     Family History  Problem Relation Age of Onset   Tuberculosis Father      Social History   Socioeconomic History   Marital status: Married    Spouse name: Not on file   Number of children: Not on file   Years of education: Not on file   Highest education level: Not on file  Occupational History   Not on  file  Tobacco Use   Smoking status: Never Smoker   Smokeless tobacco: Never Used  Vaping Use   Vaping Use: Never used  Substance and Sexual Activity   Alcohol use: No   Drug use: Never   Sexual activity: Not on file  Other Topics Concern   Not on file  Social History Narrative   Retired.    Social Determinants of Health   Financial Resource Strain:    Difficulty of Paying Living Expenses: Not on file  Food Insecurity:    Worried About Charity fundraiser in the Last Year: Not on file   [employer] of Food in the Last Year: Not on file  Transportation Needs:    Lack of Transportation (Medical): Not on file   Lack of Transportation (Non-Medical): Not on file  Physical Activity:    Days of Exercise per Week: Not on file   Minutes of Exercise per Session: Not on file  Stress:    Feeling of Stress : Not on file  Social Connections:    Frequency of Communication with Friends and Family: Not on file   Frequency of Social Gatherings with Friends and Family: Not on file   Attends Religious Services: Not on file   Active  Member of Clubs or Organizations: Not on file   Attends Archivist Meetings: Not on file   Marital Status: Not on file  Intimate Partner Violence:    Fear of Current or Ex-Partner: Not on file   Emotionally Abused: Not on file   Physically Abused: Not on file   Sexually Abused: Not on file     BP 134/76    Pulse 81    Ht 6\' 3"  (1.905 m)    Wt 170 lb (77.1 kg)    SpO2 96%    BMI 21.25 kg/m   Physical Exam:  Well appearing NAD HEENT: Unremarkable Neck:  No JVD, no thyromegally Lymphatics:  No adenopathy Back:  No CVA tenderness Lungs:  Clear with no wheezes HEART:  Regular rate rhythm, no murmurs, no rubs, no clicks Abd:  soft, positive bowel sounds, no organomegally, no rebound, no guarding Ext:  2 plus pulses, no edema, no cyanosis, no clubbing Skin:  No rashes no nodules Neuro:  CN II through XII intact, motor grossly  intact  EKG - nsr  DEVICE  Normal device function.  See PaceArt for details.   Assess/Plan: 1. VT - he has had several episodes of slow VT with no therapies. He will continue low dose amiodarone. 2. Chronic systolic heart failure - his symptoms are class 2B. He will continue his current meds. 3. Dyslipidemia - I asked him to stop his statin therapy today. Hopefully his muscle weakness will get better. 4. ICD - his medtronic DDD ICD is working normally with over 2 years of battery longevity.  Carleene Overlie Eric Yanko,MD

## 2020-07-28 NOTE — Patient Instructions (Addendum)
Medication Instructions:  Your physician has recommended you make the following change in your medication:   1.  STOP atorvastatin (Lipitor)   Labwork: None ordered.  Testing/Procedures: None ordered.  Follow-Up: Your physician wants you to follow-up in: 6 months with Dr. Lovena Le.   You will receive a reminder letter in the mail two months in advance. If you don't receive a letter, please call our office to schedule the follow-up appointment.  Remote monitoring is used to monitor your ICD from home. This monitoring reduces the number of office visits required to check your device to one time per year. It allows [place] to keep an eye on the functioning of your device to ensure it is working properly. You are scheduled for a device check from home on 08/21/2020. You may send your transmission at any time that day. If you have a wireless device, the transmission will be sent automatically. After your physician reviews your transmission, you will receive a postcard with your next transmission date.  Any Other Special Instructions Will Be Listed Below (If Applicable).  If you need a refill on your cardiac medications before your next appointment, please call your pharmacy.

## 2020-07-31 ENCOUNTER — Telehealth: Payer: Self-pay

## 2020-07-31 NOTE — Telephone Encounter (Signed)
Pt sent transmission. 07-31-2020.

## 2020-08-02 ENCOUNTER — Telehealth: Payer: Self-pay | Admitting: Emergency Medicine

## 2020-08-02 NOTE — Telephone Encounter (Signed)
Received unscheduled transmission that had episode of VT on 07/30/20 at 8:35 that was 4 min , 56 seconds long that fell in VT monitor zone . Spoke with patient's caregiver Mr. Jola Baptist (DPR) and he reports that patient was asymptomatic at time of event and transmission was sent because new monitor received.

## 2020-08-03 NOTE — Telephone Encounter (Signed)
Watchful waiting if asymptomatic.

## 2020-08-08 ENCOUNTER — Ambulatory Visit (INDEPENDENT_AMBULATORY_CARE_PROVIDER_SITE_OTHER): Payer: Medicare Other | Admitting: Neurology

## 2020-08-08 ENCOUNTER — Encounter: Payer: Self-pay | Admitting: Neurology

## 2020-08-08 ENCOUNTER — Other Ambulatory Visit: Payer: Self-pay

## 2020-08-08 ENCOUNTER — Telehealth: Payer: Self-pay | Admitting: Neurology

## 2020-08-08 VITALS — BP 133/76 | HR 68 | Ht 75.0 in | Wt 169.0 lb

## 2020-08-08 DIAGNOSIS — G1222 Progressive bulbar palsy: Secondary | ICD-10-CM | POA: Diagnosis not present

## 2020-08-08 DIAGNOSIS — I6389 Other cerebral infarction: Secondary | ICD-10-CM

## 2020-08-08 NOTE — Progress Notes (Signed)
[facility] [address] [place]. [place] [address] [phone]       OFFICE FOLLOW UP VISIT NOTE  [name] Date of Birth:  [birth year] Medical Record Number:  [id number]   Referring MD: Ena Dawley  Reason for Referral: Speech difficulties and trouble swallowing HPI: Initial visit 05/30/2020 [name] is a 86 year old Caucasian male with past medical history of chronic systolic congestive heart failure, nonischemic cardiomyopathy, autonomic dysfunction who is accompanied today by his friend [name] who is also his caregiver. History is obtained from them, review of electronic medical records and have reviewed available imaging films in PACS. He states for the last 3 months or so has had gradual worsening of his speech which has become slurred and difficult to understand. When he is having conversations over the phone of the people can barely understand what he saying. He is also noticed increased drooling of saliva as well as some trouble swallowing. He denies any pain in his neck or a mechanical obstruction when he swallows. He had modified barium swallow done recently on 05/25/2020 which showed mild to moderate oropharyngeal phase dysphagia with weak manipulation of the tongue and delay in swallow initiation and reduced tongue base retraction. Patient denies any fasciculations or twitching's in his extremities or weakness in his arms legs. He does admit to having noticed some imbalance and dizziness particularly when he first stands up but this is transient and resolves in few seconds. At times he has noticed that he trips easily though he has had no major falls or injuries. He does have a history of a major neck injury from a fall 30 years ago and had pain in the neck and difficulty walking for quite some time but he improved. Patient has not noticed any emotional lability with inappropriate laughter or crying. 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 Does not drink alcohol or smoke. He has no prior history of strokes TIAs seizures or significant neurological problems. Review of electronic medical records show that he was seen in the ER for these complaints and had CT angiogram of the brain and neck done on 03/31/2020 which shows moderate right M2 stenosis and mild irregularity of the left P3 segment of posterior cerebral artery. There were no significant extracranial stenosis noted. Patient has a pacemaker and MRI cannot be done. He denies significant paresthesias or pain in his hands or feet. Update 08/08/2020: He returns for follow-up after last visit 2 months ago.  Is accompanied by his friend.  He continues to have swallowing difficulties and slurred speech which is unchanged.  His continues to have significant drooling of saliva and has trouble swallowing it.  He feels his symptoms are not getting worse but they are not much better either.  He denies any muscle cramps, fasciculations, upper or lower extremity weakness or difficulty with gait or balance.  He had lab work done on 05/30/2020 which showed normal ESR, vitamin D, serum protein electrophoresis, T3, T4, TSH and zinc.  CMP and CBC were also normal.  ANA was positive but patient has no other symptoms to suggest lupus.  CT scan of the head without contrast on 06/02/2020 showed mild generalized age-appropriate atrophy no acute abnormalities.  CT scan of cervical soft tissues on 06/03/2020 showed no structural lesions except small 9 mm submucosal cyst on the left.  CT scan of cervical spine on 06/30/2020 showed cervical spondylosis most prominent at C5-6 and C6-7 but without significant canal  stenosis.  EMG nerve conduction study done on 07/04/2020 by Dr. Jannifer Franklin showed mild peripheral neuropathy and right carpal tunnel but no evidence of diffuse denervation to suggest motor neuron disease.  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  I recommended referral to Dr. Tedra Coupe at [facility] for second opinion. ROS:   14 system review of systems is positive for speech difficulties, difficulty swallowing, imbalance, tripping, staggering, dizziness and all other systems negative  PMH:  Past Medical History:  Diagnosis Date  . Automatic implantable cardiac defibrillator in situ    MDT, implanted 2009  . CAD (coronary artery disease)    minimal by cath  . CHF NYHA class II ([place])   . DCM (dilated cardiomyopathy) ([place])     Social History:  Social History   Socioeconomic History  . Marital status: Married    Spouse name: Not on file  . Number of children: Not on file  . Years of education: Not on file  . Highest education level: Not on file  Occupational History  . Not on file  Tobacco Use  . Smoking status: Never Smoker  . Smokeless tobacco: Never Used  Vaping Use  . Vaping Use: Never used  Substance and Sexual Activity  . Alcohol use: No  . Drug use: Never  . Sexual activity: Not on file  Other Topics Concern  . Not on file  Social History Narrative   Retired.    Social Determinants of Health   Financial Resource Strain:   . Difficulty of Paying Living Expenses: Not on file  Food Insecurity:   . Worried About Charity fundraiser in the Last Year: Not on file  . Ran Out of Food in the Last Year: Not on file  Transportation Needs:   . Lack of Transportation (Medical): Not on file  . Lack of Transportation (Non-Medical): Not on file  Physical Activity:   . Days of Exercise per Week: Not on file  . Minutes of Exercise per Session: Not on file  Stress:   . Feeling of Stress : Not on file  Social Connections:   . Frequency of Communication with Friends and Family: Not on file  . Frequency of Social Gatherings with Friends and Family: Not on file  . Attends Religious Services: Not on file  . Active Member of Clubs or Organizations: Not  on file  . Attends Archivist Meetings: Not on file  . Marital Status: Not on file  Intimate Partner Violence:   . Fear of Current or Ex-Partner: Not on file  . Emotionally Abused: Not on file  . Physically Abused: Not on file  . Sexually Abused: Not on file    Medications:   Current Outpatient Medications on File Prior to Visit  Medication Sig Dispense Refill  . amiodarone (PACERONE) 200 MG tablet Take one tablet by mouth daily Monday through Saturday.  Do NOT take on Sunday. 90 tablet 3  . finasteride (PROSCAR) 5 MG tablet Take 1 tablet by mouth daily.    Marland Kitchen levothyroxine (SYNTHROID, LEVOTHROID) 50 MCG tablet Take 50 mcg by mouth daily.      Marland Kitchen losartan (COZAAR) 25 MG tablet Take 25 mg by mouth daily.    . metoprolol tartrate (LOPRESSOR) 25 MG tablet Take 25 mg by mouth 2 (two) times daily.     . Tamsulosin HCl (FLOMAX) 0.4 MG  CAPS Take 0.4 mg by mouth daily.     No current facility-administered medications on file prior to visit.    Allergies:  No Known Allergies  Physical Exam General: Frail elderly Caucasian male, seated, in no evident distress Head: head normocephalic and atraumatic.   Neck: supple with no carotid or supraclavicular bruits Cardiovascular: regular rate and rhythm, no murmurs Musculoskeletal: no deformity Skin:  no rash/petichiae pacemaker noted in the left infraclavicular region. Vascular:  Normal pulses all extremities  Neurologic Exam Mental Status: Awake and fully alert. Oriented to place and time. Recent and remote memory intact. Attention span, concentration and fund of knowledge appropriate. Mood and affect appropriate. Speech is dysarthric with pseudobulbar quality. Decreased facial expression. Jaw jerk is brisk. Tongue appears to be atrophied with  fasciculations. Cranial Nerves: Fundoscopic exam not done. Pupils equal, briskly reactive to light. Extraocular movements full without nystagmus. Visual fields full to confrontation. Hearing intact.  Facial sensation intact. Face, tongue, palate moves normally and symmetrically.  Motor: Normal bulk and tone. Normal strength in all tested extremity muscles. No visible fasciculations are noted over the trunk or extremities.  Sensory.: intact to touch , pinprick , position and vibratory sensation.  Coordination: Rapid alternating movements normal in all extremities. Finger-to-nose and heel-to-shin performed accurately bilaterally. Gait and Station: Arises from chair without difficulty. Stance is normal. Gait demonstrates normal stride length and balance . Able to heel, toe and tandem walk without difficulty.  Reflexes: 2+ brisk and symmetric. Toes downgoing.       ASSESSMENT: 86 year old Caucasian male with subacute slurred speech and swallowing difficulties with neurological exam suggesting bulbar palsy.  Work-up for structural and compressive lesions has been negative.    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  I recommend checking paraneoplastic antibody panel as well as CT scan chest abdomen pelvis to rule out malignancy given presence of peripheral neuropathy on EMG.  I also recommend referral for second opinion to Dr. Tedra Coupe at [facility] and if diagnosis is confirmed may consider trial of rilutek in future.ic he will return for follow-up in the future in 2 months or call earlier if necessary.  He was advised to follows dysphagia precautions as advised by speech therapy. Greater than 50% time during this 35-minute  visit was spent on counseling and coordination of care about his dysarthria and dysphagia and discussion about possible diagnosis of bulbar palsy and ALS and discussion about evaluation and treatment plan and answering questions.  Antony Contras,  MD  [facility] [address] [place], [place] [address]  Phone [phone] Fax [phone] Note: This document was prepared with digital dictation and possible smart phrase technology. Any transcriptional errors that result from this process are unintentional.

## 2020-08-08 NOTE — Telephone Encounter (Signed)
Medicare/[employer] insurance order sent to [facility]. No auth they will reach out to the patient to schedule.

## 2020-08-08 NOTE — Patient Instructions (Signed)
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  I recommend checking paraneoplastic antibody panel as well as CT scan chest abdomen pelvis to rule out malignancy given presence of peripheral neuropathy on EMG.  I also recommend referral for second opinion to Dr. Tedra Coupe at [facility] and if diagnosis is confirmed may consider trial of rilutek in future.ic he will return for follow-up in the future in 2 months or call earlier if necessary.

## 2020-08-10 ENCOUNTER — Telehealth: Payer: Self-pay

## 2020-08-10 NOTE — Progress Notes (Signed)
Kindly inform the patient that most of the neuropathy panel labs are normal.  His ANA test was positive but this is unchanged from before and likely an incidental finding of no clinical significance

## 2020-08-10 NOTE — Telephone Encounter (Signed)
Pt verified by name and DOB, results given per provider, pt voiced understanding all question answered. 

## 2020-08-15 ENCOUNTER — Ambulatory Visit
Admission: RE | Admit: 2020-08-15 | Discharge: 2020-08-15 | Disposition: A | Discharge status: Home / Self Care | Payer: Medicare Other | Source: Ambulatory Visit | Attending: Neurology | Admitting: Neurology

## 2020-08-15 ENCOUNTER — Other Ambulatory Visit: Payer: Self-pay

## 2020-08-15 DIAGNOSIS — N400 Enlarged prostate without lower urinary tract symptoms: Secondary | ICD-10-CM | POA: Diagnosis not present

## 2020-08-15 DIAGNOSIS — G1222 Progressive bulbar palsy: Secondary | ICD-10-CM

## 2020-08-15 DIAGNOSIS — C419 Malignant neoplasm of bone and articular cartilage, unspecified: Secondary | ICD-10-CM | POA: Diagnosis not present

## 2020-08-15 MED ORDER — IOPAMIDOL (ISOVUE-300) INJECTION 61%
100.0000 mL | Freq: Once | INTRAVENOUS | Status: AC | PRN
  Administered 2020-08-15: 100 mL via INTRAVENOUS

## 2020-08-17 LAB — PARANEOPLASTIC AUTOAB EVAL
AchR Ganglionic Neuronal Ab,S: 0.04 nmol/L — ABNORMAL HIGH (ref <=0.02)
Amphiphysin Ab, S: NEGATIVE titer
Anti-Glial Nuclear Ab Type 1: NEGATIVE titer
Anti-Neuronal Nucl. Ab, Type 1: NEGATIVE titer
Anti-Neuronal Nucl. Ab, Type 2: NEGATIVE titer
Anti-Neuronal Nucl. Ab, Type 3: NEGATIVE titer
CRMP-5 IgG, S: NEGATIVE titer
Calcium Channel, Ab P/Q-Type: 0 nmol/L (ref <=0.02)
Neuronal (V + G) K+ Channel Ab,S: 0 nmol/L (ref <=0.02)
Purkinje Cell Cytop.Ab Type Tr: NEGATIVE titer
Purkinje Cell Cytop.Ab, Type 2: NEGATIVE titer
Purkinje Cell Cytop.Ab,Type 1: NEGATIVE titer

## 2020-08-17 LAB — NEUROPATHY PANEL
A/G Ratio: 1.5 (ref 0.7–1.7)
Albumin ELP: 3.9 g/dL (ref 2.9–4.4)
Alpha 1: 0.2 g/dL (ref 0.0–0.4)
Alpha 2: 0.5 g/dL (ref 0.4–1.0)
Angio Convert Enzyme: 88 U/L — ABNORMAL HIGH (ref 14–82)
Anti Nuclear Antibody (ANA): POSITIVE — AB
Beta: 0.9 g/dL (ref 0.7–1.3)
Gamma Globulin: 1 g/dL (ref 0.4–1.8)
Globulin, Total: 2.6 g/dL (ref 2.2–3.9)
Rheumatoid fact SerPl-aCnc: <10 IU/mL (ref 0.0–13.9)
Sed Rate: 2 mm/hr (ref 0–30)
TSH: 4.22 u[IU]/mL (ref 0.450–4.500)
Total Protein: 6.5 g/dL (ref 6.0–8.5)
Vit D, 25-Hydroxy: 36.5 ng/mL (ref 30.0–100.0)
Vitamin B-12: 514 pg/mL (ref 232–1245)

## 2020-08-18 ENCOUNTER — Other Ambulatory Visit: Payer: Self-pay | Admitting: Neurology

## 2020-08-18 DIAGNOSIS — C3411 Malignant neoplasm of upper lobe, right bronchus or lung: Secondary | ICD-10-CM

## 2020-08-18 NOTE — Progress Notes (Signed)
I called the patient's listed number and spoke to his brother [name] and gave results of CT scan of the chest abdomen pelvis showing suspicious right lung nodule possibly malignancy and lab work for paraneoplastic panel also suggesting remote effect of cancer.  I put in an urgent consult request to cancer center.  He voiced understanding

## 2020-08-21 ENCOUNTER — Ambulatory Visit (INDEPENDENT_AMBULATORY_CARE_PROVIDER_SITE_OTHER): Payer: Medicare Other

## 2020-08-21 ENCOUNTER — Telehealth: Payer: Self-pay | Admitting: *Deleted

## 2020-08-21 DIAGNOSIS — I428 Other cardiomyopathies: Secondary | ICD-10-CM | POA: Diagnosis not present

## 2020-08-21 DIAGNOSIS — I5022 Chronic systolic (congestive) heart failure: Secondary | ICD-10-CM

## 2020-08-21 DIAGNOSIS — R911 Solitary pulmonary nodule: Secondary | ICD-10-CM

## 2020-08-21 NOTE — Progress Notes (Signed)
I called the patient and the listed contact number and informed his brother about the results of this CT chest abdomen pelvis and made urgent referral to the oncology center as well as pulmonary clinic.

## 2020-08-21 NOTE — Telephone Encounter (Signed)
I received referral today on [name].  Updated on appt to see Dr. Julien Nordmann on 08/31/20, declined earlier appt.

## 2020-08-22 ENCOUNTER — Telehealth: Payer: Self-pay

## 2020-08-22 LAB — CUP PACEART REMOTE DEVICE CHECK
Battery Remaining Longevity: 40 mo
Battery Voltage: 2.96 V
Brady Statistic AP VP Percent: 0.28 %
Brady Statistic AP VS Percent: 99.44 %
Brady Statistic AS VP Percent: 0 %
Brady Statistic AS VS Percent: 0.28 %
Brady Statistic RA Percent Paced: 97.82 %
Brady Statistic RV Percent Paced: 0.27 %
Date Time Interrogation Session: 20211004052726
HighPow Impedance: 40 Ohm
HighPow Impedance: 48 Ohm
Implantable Lead Implant Date: 20090204
Implantable Lead Implant Date: 20090204
Implantable Lead Location: 753859
Implantable Lead Location: 753860
Implantable Lead Model: 5076
Implantable Lead Model: 6947
Implantable Pulse Generator Implant Date: 20161130
Lead Channel Impedance Value: 323 Ohm
Lead Channel Impedance Value: 380 Ohm
Lead Channel Impedance Value: 456 Ohm
Lead Channel Pacing Threshold Amplitude: 0.875 V
Lead Channel Pacing Threshold Amplitude: 1 V
Lead Channel Pacing Threshold Pulse Width: 0.4 ms
Lead Channel Pacing Threshold Pulse Width: 0.4 ms
Lead Channel Sensing Intrinsic Amplitude: 1.375 mV
Lead Channel Sensing Intrinsic Amplitude: 1.375 mV
Lead Channel Sensing Intrinsic Amplitude: 4 mV
Lead Channel Sensing Intrinsic Amplitude: 4 mV
Lead Channel Setting Pacing Amplitude: 2 V
Lead Channel Setting Pacing Amplitude: 2.5 V
Lead Channel Setting Pacing Pulse Width: 0.4 ms
Lead Channel Setting Sensing Sensitivity: 0.3 mV

## 2020-08-22 NOTE — Telephone Encounter (Signed)
Scheduled remote on 08/21/20. Alert of 1 VT-monitor episode on 08/19/20 for 7 minutes , avg rate143 bpm. Previous hx of same.   Called patient to see if symptomatic. Spoke to [place] ([facility]), states he spoke to patient and patient states he had no complaints Saturday.   Requested patient and Ronalee Belts to let us known if he experiences any palpitations, chest pain, shortness of breath or other complaints concerning.   Verbalized understanding.

## 2020-08-23 NOTE — Progress Notes (Signed)
Remote ICD transmission.   

## 2020-08-31 ENCOUNTER — Other Ambulatory Visit: Payer: Self-pay | Admitting: *Deleted

## 2020-08-31 ENCOUNTER — Inpatient Hospital Stay: Payer: Medicare Other

## 2020-08-31 ENCOUNTER — Other Ambulatory Visit: Payer: Self-pay

## 2020-08-31 ENCOUNTER — Inpatient Hospital Stay: Payer: Medicare Other | Attending: Internal Medicine | Admitting: Internal Medicine

## 2020-08-31 ENCOUNTER — Encounter: Payer: Self-pay | Admitting: Internal Medicine

## 2020-08-31 VITALS — BP 148/89 | HR 79 | Temp 98.5°F | Resp 17 | Ht 75.0 in | Wt 171.1 lb

## 2020-08-31 DIAGNOSIS — R911 Solitary pulmonary nodule: Secondary | ICD-10-CM | POA: Insufficient documentation

## 2020-08-31 DIAGNOSIS — R1310 Dysphagia, unspecified: Secondary | ICD-10-CM | POA: Diagnosis not present

## 2020-08-31 LAB — CBC WITH DIFFERENTIAL (CANCER CENTER ONLY)
Abs Immature Granulocytes: 0.02 10*3/uL (ref 0.00–0.07)
Basophils Absolute: 0.1 10*3/uL (ref 0.0–0.1)
Basophils Relative: 2 %
Eosinophils Absolute: 0.7 10*3/uL — ABNORMAL HIGH (ref 0.0–0.5)
Eosinophils Relative: 9 %
HCT: 38 % — ABNORMAL LOW (ref 39.0–52.0)
Hemoglobin: 12.8 g/dL — ABNORMAL LOW (ref 13.0–17.0)
Immature Granulocytes: 0 %
Lymphocytes Relative: 34 %
Lymphs Abs: 2.5 10*3/uL (ref 0.7–4.0)
MCH: 30.9 pg (ref 26.0–34.0)
MCHC: 33.7 g/dL (ref 30.0–36.0)
MCV: 91.8 fL (ref 80.0–100.0)
Monocytes Absolute: 0.7 10*3/uL (ref 0.1–1.0)
Monocytes Relative: 9 %
Neutro Abs: 3.4 10*3/uL (ref 1.7–7.7)
Neutrophils Relative %: 46 %
Platelet Count: 257 10*3/uL (ref 150–400)
RBC: 4.14 MIL/uL — ABNORMAL LOW (ref 4.22–5.81)
RDW: 13.2 % (ref 11.5–15.5)
WBC Count: 7.5 10*3/uL (ref 4.0–10.5)
nRBC: 0 % (ref 0.0–0.2)

## 2020-08-31 LAB — CMP (CANCER CENTER ONLY)
ALT: 29 U/L (ref 0–44)
AST: 19 U/L (ref 15–41)
Albumin: 3.3 g/dL — ABNORMAL LOW (ref 3.5–5.0)
Alkaline Phosphatase: 90 U/L (ref 38–126)
Anion gap: 6 (ref 5–15)
BUN: 19 mg/dL (ref 8–23)
CO2: 27 mmol/L (ref 22–32)
Calcium: 8.9 mg/dL (ref 8.9–10.3)
Chloride: 105 mmol/L (ref 98–111)
Creatinine: 1.21 mg/dL (ref 0.61–1.24)
GFR, Estimated: 55 mL/min — ABNORMAL LOW (ref >60)
Glucose, Bld: 95 mg/dL (ref 70–99)
Potassium: 4.3 mmol/L (ref 3.5–5.1)
Sodium: 138 mmol/L (ref 135–145)
Total Bilirubin: 0.6 mg/dL (ref 0.3–1.2)
Total Protein: 6.3 g/dL — ABNORMAL LOW (ref 6.5–8.1)

## 2020-08-31 NOTE — Progress Notes (Signed)
The proposed treatment discussed in cancer conference 08/31/20 is for discussion purpose only and is not a binding recommendation.  The patient was not physically examined nor present for their treatment options.  Therefore, final treatment plans cannot be decided.

## 2020-08-31 NOTE — Progress Notes (Signed)
[place] Telephone:([phone]) [phone]   [phone]  CONSULT NOTE  REFERRING PHYSICIAN: Dr. Antony Contras  REASON FOR CONSULTATION:  86 years old white male with suspicious pulmonary nodule  HPI [name] is a 86 y.o. male is a never smoker with history of coronary artery disease, cardiac defibrillator, congestive heart failure, dilated cardiomyopathy.  The patient was undergoing neurological evaluation for slurred speech and dysphagia.  He had CT scan of the chest, abdomen and pelvis performed on 08/15/2020 as part of this evaluation.  The scan showed an irregular nodule in the posterior aspect of the superior segment of the right lower lobe measuring 0.89 cm.  This is worrisome for primary bronchogenic neoplasm but metastatic disease was not excluded. The patient was referred by his neurologist to the multidisciplinary clinic today for evaluation and recommendation regarding his condition. When seen today he is feeling fine with no concerning complaints except for the saliva drooling as well as the dysphagia and slurred speech.  He is currently being evaluated for this problem by neurology. He denied having any chest pain, shortness of breath, cough or hemoptysis.  He denied having any fever or chills.  He has no nausea, vomiting, diarrhea or constipation.  He has no headache or visual changes. Family history significant for father died from TB and black lung.  Mother died at age 51 with old age. The patient is single and has 3 daughters.  He was accompanied today by a neighbor and caregiver [name].  [phone].  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  He has no history of smoking, alcohol or drug abuse.  HPI  Past Medical History:  Diagnosis Date   Automatic implantable cardiac defibrillator in situ    MDT, implanted 2009   CAD (coronary artery disease)    minimal by cath   CHF NYHA class II ([place])    DCM (dilated  cardiomyopathy) ([place])     Past Surgical History:  Procedure Laterality Date   CARDIAC CATHETERIZATION  12/17/07   EP IMPLANTABLE DEVICE N/A 10/18/2015   Procedure: ICD Generator Changeout;  Surgeon: Evans Lance, MD;  Location: [facility];  Service: Cardiovascular;  Laterality: N/A;   S/P ICD  2009    Family History  Problem Relation Age of Onset   Tuberculosis Father     Social History Social History   Tobacco Use   Smoking status: Never Smoker   Smokeless tobacco: Never Used  Vaping Use   Vaping Use: Never used  Substance Use Topics   Alcohol use: No   Drug use: Never    No Known Allergies  Current Outpatient Medications  Medication Sig Dispense Refill   amiodarone (PACERONE) 200 MG tablet Take one tablet by mouth daily Monday through Saturday.  Do NOT take on Sunday. 90 tablet 3   finasteride (PROSCAR) 5 MG tablet Take 1 tablet by mouth daily.     levothyroxine (SYNTHROID, LEVOTHROID) 50 MCG tablet Take 50 mcg by mouth daily.       losartan (COZAAR) 25 MG tablet Take 25 mg by mouth daily.     metoprolol tartrate (LOPRESSOR) 25 MG tablet Take 25 mg by mouth 2 (two) times daily.      Tamsulosin HCl (FLOMAX) 0.4 MG CAPS Take 0.4 mg by mouth daily.     No current facility-administered medications for this visit.    Review of Systems  Constitutional: positive for fatigue Eyes: negative  Ears, nose, mouth, throat, and face: positive for Saliva drooling Respiratory: negative Cardiovascular: negative Gastrointestinal: negative Genitourinary:negative Integument/breast: negative Hematologic/lymphatic: negative Musculoskeletal:negative Neurological: positive for speech problems Behavioral/Psych: negative Endocrine: negative Allergic/Immunologic: negative  Physical Exam  BDZ:HGDJM, healthy, no distress, well nourished and well developed SKIN: skin color, texture, turgor are normal, no rashes or significant lesions HEAD: Normocephalic, No  masses, lesions, tenderness or abnormalities EYES: normal, PERRLA, Conjunctiva are pink and non-injected EARS: External ears normal, Canals clear OROPHARYNX:no exudate, no erythema and lips, buccal mucosa, and tongue normal  NECK: supple, no adenopathy, no JVD LYMPH:  no palpable lymphadenopathy, no hepatosplenomegaly LUNGS: clear to auscultation , and palpation HEART: regular rate & rhythm, no murmurs and no gallops ABDOMEN:abdomen soft, non-tender, normal bowel sounds and no masses or organomegaly BACK: No CVA tenderness, Range of motion is normal EXTREMITIES:no joint deformities, effusion, or inflammation, no edema  NEURO: alert & oriented x 3 with fluent speech, no focal motor/sensory deficits  PERFORMANCE STATUS: ECOG 1  LABORATORY DATA: Lab Results  Component Value Date   WBC 7.5 08/31/2020   HGB 12.8 (L) 08/31/2020   HCT 38.0 (L) 08/31/2020   MCV 91.8 08/31/2020   PLT 257 08/31/2020      Chemistry      Component Value Date/Time   NA 137 05/30/2020 1033   K 5.1 05/30/2020 1033   CL 100 05/30/2020 1033   CO2 24 05/30/2020 1033   BUN 18 05/30/2020 1033   CREATININE 1.17 05/30/2020 1033   CREATININE 1.03 10/16/2015 0850      Component Value Date/Time   CALCIUM 9.1 05/30/2020 1033   ALKPHOS 101 05/30/2020 1033   AST 27 05/30/2020 1033   ALT 30 05/30/2020 1033   BILITOT 0.7 05/30/2020 1033       RADIOGRAPHIC STUDIES: CT CHEST ABDOMEN PELVIS W CONTRAST  Result Date: 08/16/2020 CLINICAL DATA:  Low-grade musculoskeletal neoplasm. Paraneoplastic syndrome. EXAM: CT CHEST, ABDOMEN, AND PELVIS WITH CONTRAST TECHNIQUE: Multidetector CT imaging of the chest, abdomen and pelvis was performed following the standard protocol during bolus administration of intravenous contrast. CONTRAST:  117mL ISOVUE-300 IOPAMIDOL (ISOVUE-300) INJECTION 61% Creatinine was obtained on site at [facility] at [address] Results: Creatinine 1.1 mg/dL. COMPARISON:  None. FINDINGS: CT  CHEST FINDINGS Cardiovascular: Atherosclerotic calcification of the aorta and coronary arteries. Ascending aorta measures at the upper limits of normal, 3.9 cm. Heart is enlarged with left ventricular dilatation. Small amount of dependent pericardial fluid is likely physiologic. Mediastinum/Nodes: No pathologically enlarged mediastinal, hilar or axillary lymph nodes. Esophagus is unremarkable. Lungs/Pleura: An irregular nodule in the posterior aspect of the superior segment right lower lobe measures 8.9 mm (4/83). Mild dependent atelectasis in the lower lobes. Lungs are otherwise clear. No pleural fluid. Airway is unremarkable. Musculoskeletal: Degenerative changes in the spine. Old rib fractures. No worrisome lytic or sclerotic lesions. CT ABDOMEN PELVIS FINDINGS Hepatobiliary: Liver and gallbladder are unremarkable. No biliary ductal dilatation. Pancreas: Negative. Spleen: Negative. Adrenals/Urinary Tract: Adrenal glands and kidneys are unremarkable. Ureters are decompressed. Bladder is grossly unremarkable. Stomach/Bowel: Stomach, small bowel, appendix and colon are unremarkable. Vascular/Lymphatic: Atherosclerotic calcification of the aorta without aneurysm. No pathologically enlarged lymph nodes. 7 mm left external iliac lymph node (2/106) is not enlarged by CT size criteria. Reproductive: Prostate is heterogeneous and enlarged. Other: No free fluid.  Mesenteries and peritoneum are unremarkable. Musculoskeletal: Vague rounded areas of sclerosis in the L1 through L4 vertebral bodies are seen adjacent to the endplates, favoring a degenerative etiology. No worrisome  lytic or sclerotic lesions. IMPRESSION: 1. Irregular right lower lobe nodule is worrisome for primary bronchogenic carcinoma. Metastatic disease is not excluded. 2. Enlarged, heterogeneous prostate. 3. Aortic atherosclerosis (ICD10-I70.0). Coronary artery calcification. Electronically Signed   By: Lorin Picket M.D.   On: 08/16/2020 09:08   CUP  PACEART REMOTE DEVICE CHECK  Result Date: 08/22/2020 Scheduled remote reviewed. Normal device function.  1 VT-Mon episode, 08/19/20 with duration at least 7 minutes, avg rate 463ms/143bpm. Routing for further review. Next remote 91 days- [employer], RN/CVRS   ASSESSMENT: This is a very pleasant 86 years old white male presented for evaluation of right lower lobe subcentimeter pulmonary nodule seen on recent CT scan of the chest, abdomen pelvis on 08/15/2020 during evaluation of his dysphagia and slurred speech looking for underlying malignancy as an etiology for his neurological abnormalities.   PLAN: I had a lengthy discussion with the patient and his friend today about his current condition and further investigation to confirm a diagnosis. I personally and independently reviewed the scan images and discussed the result and showed the images to the patient and his friend. Interestingly the patient has the same finding on CT scan of the chest in April 2019 and has not significantly changed since that time. This could be benign in etiology but slowly growing malignancy or carcinoid tumor could not be completely excluded. I recommended for the patient to see pulmonary medicine for further evaluation of this pulmonary nodule and close monitoring. I do not see any need to proceed with a PET scan or biopsy at this point taking into the consideration the stability over 2.5 years. I will be happy to see the patient in the future but there is no need for intervention from my side at this point. The patient and his friend agreed to the current plan. He was advised to call if he has any other concerning issues. The patient voices understanding of current disease status and treatment options and is in agreement with the current care plan.  All questions were answered. The patient knows to call the clinic with any problems, questions or concerns. We can certainly see the patient much sooner if necessary.  Thank you  so much for allowing me to participate in the care of [name]. I will continue to follow up the patient with you and assist in his care.  The total time spent in the appointment was 60 minutes.  Disclaimer: This note was dictated with voice recognition software. Similar sounding words can inadvertently be transcribed and may not be corrected upon review.   Eilleen Kempf August 31, 2020, 1:46 PM

## 2020-09-12 ENCOUNTER — Encounter: Payer: Self-pay | Admitting: *Deleted

## 2020-09-12 NOTE — Progress Notes (Signed)
Patient was referred to oncology but per Dr. Julien Nordmann, patient needs to be followed with pulmonary at this time.  Referral placed. Patient has an appt with Dr. Valeta Harms, pulmonologist on 11/1.  No need for navigation at this time.

## 2020-09-14 DIAGNOSIS — G1221 Amyotrophic lateral sclerosis: Secondary | ICD-10-CM | POA: Diagnosis not present

## 2020-09-14 DIAGNOSIS — K117 Disturbances of salivary secretion: Secondary | ICD-10-CM | POA: Diagnosis not present

## 2020-09-14 DIAGNOSIS — R471 Dysarthria and anarthria: Secondary | ICD-10-CM | POA: Diagnosis not present

## 2020-09-14 DIAGNOSIS — R1310 Dysphagia, unspecified: Secondary | ICD-10-CM | POA: Diagnosis not present

## 2020-09-18 ENCOUNTER — Institutional Professional Consult (permissible substitution): Payer: Medicare Other | Admitting: Pulmonary Disease

## 2020-09-26 ENCOUNTER — Encounter: Payer: Medicare Other | Admitting: Internal Medicine

## 2020-10-16 ENCOUNTER — Telehealth: Payer: Self-pay

## 2020-10-16 NOTE — Telephone Encounter (Signed)
Increase amiodarone to 200 mg daily. GT

## 2020-10-16 NOTE — Telephone Encounter (Signed)
Manual transmission received- 30 new VHR events; longest logged 22 minutes w/ rates 140's to 160's bpm; available EGMs suggest VT.  (2 episodes back to back)  Pt has history of the same, although typically episodes are only minutes long.    Pt meds include Amiodarone 200mg  daily Monday through Saturday.  Metoprolol 25 mg BID.    Spoke with pt friend, Ronalee Belts (DPR on file).  He states pt unable to speak on phone/ difficult to understand.    He reports pt did have an episode in October where he didn't feel right, he had given him baby aspirin and called 911, by the time EMS arrived pt felt better.  He is not sure of the dates and if this was the same time.  Per Ronalee Belts, pt has not had any issues with Chest pain but he does frequently have dizziness.

## 2020-10-17 ENCOUNTER — Ambulatory Visit: Payer: Medicare Other | Admitting: Neurology

## 2020-10-17 MED ORDER — AMIODARONE HCL 200 MG PO TABS: 200.0000 mg | ORAL_TABLET | Freq: Every day | ORAL | 3 refills | Status: DC

## 2020-10-17 NOTE — Addendum Note (Signed)
Addended by: Willeen Cass A on: 10/17/2020 02:14 PM   Modules accepted: Orders

## 2020-10-17 NOTE — Telephone Encounter (Signed)
Call placed to [facility].  Advised per Dr. Lovena Le to have Pt increase amiodarone 200 mg back to once a day every day.  He will notify Pt.  Sent new prescription to Pt's pharmacy.

## 2020-10-18 ENCOUNTER — Ambulatory Visit: Payer: Medicare Other | Admitting: Neurology

## 2020-10-19 DIAGNOSIS — E039 Hypothyroidism, unspecified: Secondary | ICD-10-CM | POA: Diagnosis not present

## 2020-10-19 DIAGNOSIS — I10 Essential (primary) hypertension: Secondary | ICD-10-CM | POA: Diagnosis not present

## 2020-10-19 DIAGNOSIS — N183 Chronic kidney disease, stage 3 unspecified: Secondary | ICD-10-CM | POA: Diagnosis not present

## 2020-10-19 DIAGNOSIS — E782 Mixed hyperlipidemia: Secondary | ICD-10-CM | POA: Diagnosis not present

## 2020-10-24 ENCOUNTER — Telehealth: Payer: Self-pay | Admitting: Neurology

## 2020-10-24 DIAGNOSIS — I10 Essential (primary) hypertension: Secondary | ICD-10-CM | POA: Diagnosis not present

## 2020-10-24 DIAGNOSIS — R4702 Dysphasia: Secondary | ICD-10-CM | POA: Diagnosis not present

## 2020-10-24 DIAGNOSIS — Z0001 Encounter for general adult medical examination with abnormal findings: Secondary | ICD-10-CM | POA: Diagnosis not present

## 2020-10-24 DIAGNOSIS — R911 Solitary pulmonary nodule: Secondary | ICD-10-CM | POA: Diagnosis not present

## 2020-10-24 DIAGNOSIS — Z23 Encounter for immunization: Secondary | ICD-10-CM | POA: Diagnosis not present

## 2020-10-24 NOTE — Telephone Encounter (Signed)
Done

## 2020-10-24 NOTE — Telephone Encounter (Signed)
[facility]) called, Dr. Vena Austria wanted to know if [facility] has a copy of consultation from [facility]. Can contact at [phone].

## 2020-11-20 ENCOUNTER — Ambulatory Visit (INDEPENDENT_AMBULATORY_CARE_PROVIDER_SITE_OTHER): Payer: Medicare Other

## 2020-11-20 DIAGNOSIS — I428 Other cardiomyopathies: Secondary | ICD-10-CM

## 2020-11-21 LAB — CUP PACEART REMOTE DEVICE CHECK
Battery Remaining Longevity: 37 mo
Battery Voltage: 2.96 V
Brady Statistic AP VP Percent: 0.28 %
Brady Statistic AP VS Percent: 99.23 %
Brady Statistic AS VP Percent: 0 %
Brady Statistic AS VS Percent: 0.5 %
Brady Statistic RA Percent Paced: 99.45 %
Brady Statistic RV Percent Paced: 0.29 %
Date Time Interrogation Session: 20220103033623
HighPow Impedance: 40 Ohm
HighPow Impedance: 51 Ohm
Implantable Lead Implant Date: 20090204
Implantable Lead Implant Date: 20090204
Implantable Lead Location: 753859
Implantable Lead Location: 753860
Implantable Lead Model: 5076
Implantable Lead Model: 6947
Implantable Pulse Generator Implant Date: 20161130
Lead Channel Impedance Value: 304 Ohm
Lead Channel Impedance Value: 380 Ohm
Lead Channel Impedance Value: 437 Ohm
Lead Channel Pacing Threshold Amplitude: 0.875 V
Lead Channel Pacing Threshold Amplitude: 1 V
Lead Channel Pacing Threshold Pulse Width: 0.4 ms
Lead Channel Pacing Threshold Pulse Width: 0.4 ms
Lead Channel Sensing Intrinsic Amplitude: 1.375 mV
Lead Channel Sensing Intrinsic Amplitude: 1.375 mV
Lead Channel Sensing Intrinsic Amplitude: 3.25 mV
Lead Channel Sensing Intrinsic Amplitude: 3.25 mV
Lead Channel Setting Pacing Amplitude: 2.25 V
Lead Channel Setting Pacing Amplitude: 2.5 V
Lead Channel Setting Pacing Pulse Width: 0.4 ms
Lead Channel Setting Sensing Sensitivity: 0.3 mV

## 2020-12-04 NOTE — Progress Notes (Signed)
Remote ICD transmission.   

## 2021-01-01 ENCOUNTER — Telehealth: Payer: Self-pay

## 2021-01-01 NOTE — Telephone Encounter (Signed)
Carelink alert received for 9 VT events logged, 1 appears AF w/ RVR received ATP, VR slowed.  81 NSVT events logged, appears AT.   Spoke to [place] ([facility]), states he was with the patient Saturday and he did not have any complaints. When asked to speak to patient [name] states patient does hear or speak well. Advised that I will forward to Dr. Lovena Le to see if patient has driving restrictions but in the mean time patient is not advised to drive x6 months per [place] DMV. Shock plan reviewed. Advised I will forward to Dr. Lovena Le and we will call back with any changes. Agreeable to plan.

## 2021-01-01 NOTE — Telephone Encounter (Signed)
[name] of the patient needed to clarify what the Nurse recommended earlier. Please call Ronalee Belts back

## 2021-01-02 NOTE — Telephone Encounter (Signed)
Ronalee Belts [facility]) reports patient upset about driving restrictions. Explained that DR Lovena Le will review the transmission and if MD reports patient has no driving restrictions we will call the patient back. Reassured that home monitor is working.

## 2021-01-02 NOTE — Telephone Encounter (Signed)
Reviewew with Dr. Lovena Le and suggest rhythm appears double tachycardia (AFL/VT). Patient will have driving restrictions. Jenny Reichmann RN covering triage today made aware.

## 2021-01-02 NOTE — Telephone Encounter (Signed)
Mike(DPR) made aware that Dr Lovena Le reviewed the event and patient is to have driving restriction x 6 months. Ronalee Belts verbalized understanding .

## 2021-01-02 NOTE — Telephone Encounter (Signed)
[name](DPR) called back wanting to speak with nurse as he was returning her call

## 2021-01-03 NOTE — Telephone Encounter (Addendum)
Alert received for continued AF with monitored VT events.  Pt has an upcoming appt scheduled with Dr. Lovena Le on 01/29/21.

## 2021-01-05 ENCOUNTER — Telehealth: Payer: Self-pay

## 2021-01-05 MED ORDER — APIXABAN 5 MG PO TABS: 5.0000 mg | ORAL_TABLET | Freq: Two times a day (BID) | ORAL | 3 refills | Status: DC

## 2021-01-05 NOTE — Telephone Encounter (Signed)
Called patient to advise Dr. Lovena Le would like patient to start Eliquis 5 mg BID d/t new onset of AF. Spoke to [place] ([facility], patients caregiver) and made aware with verbal understanding.  Per Ronalee Belts, he has noticed patient has been more short of breath with ambulation although quickly resolved with rest. Denies chest pain/palpitations. Advised I will forward to Dr. Lovena Le and we will call with changes. ED precautions reviewed with verbal understanding.

## 2021-01-08 ENCOUNTER — Telehealth: Payer: Self-pay

## 2021-01-13 DIAGNOSIS — T7840XA Allergy, unspecified, initial encounter: Secondary | ICD-10-CM | POA: Diagnosis not present

## 2021-01-15 ENCOUNTER — Telehealth: Payer: Self-pay

## 2021-01-15 MED ORDER — RIVAROXABAN 20 MG PO TABS: 20.0000 mg | ORAL_TABLET | Freq: Every day | ORAL | 3 refills | Status: DC

## 2021-01-15 NOTE — Telephone Encounter (Signed)
Ronalee Belts [facility]) called this morning and reports patient had an allergic reaction to starting Eliquis. States he went to [facility]gent Care in [place], [place] Sunday with redness to face, swelling and hives. Was given IV fluids and steroids. Advised not to take any Eliquis and I will reach out to Dr. Lovena Le to see what other [facility] he can take. Ronalee Belts agreeable to plan.

## 2021-01-15 NOTE — Telephone Encounter (Signed)
Called and spoke to [place] ([facility]), advised Dr. Lovena Le would like for patient to start Xarelto 20 mg daily. States I will send to the drug store.

## 2021-01-15 NOTE — Telephone Encounter (Signed)
Per Dr. Lovena Le- change to Xarelto 20 mg one tablet by mouth daily.

## 2021-01-16 ENCOUNTER — Telehealth: Payer: Self-pay

## 2021-01-16 NOTE — Telephone Encounter (Signed)
Outreach made to [facility], [place].  Advised samples of Xarelto would be available for pick up at front desk.

## 2021-01-16 NOTE — Telephone Encounter (Signed)
The patient wanted to know if he can have some samples of Xalreto 20 mg.

## 2021-01-17 ENCOUNTER — Telehealth: Payer: Self-pay

## 2021-01-17 NOTE — Telephone Encounter (Signed)
Returned call to patient's neighbor and caregiver, Ronalee Belts (per DPR), who states patient's feet are swollen to two times their normal size. On Monday, he called to report that patient had an allergic reaction to Eliquis and had some swelling in extremities but his is much more. He states swelling has improved since he had the patient elevate his legs above his heart. I asked about weight gain which he denies; reports some SOB - pt can walk 60 ft to mailbox and back without stopping. Last echo was 2016.  I asked about diet indiscretion and Ronalee Belts states pt had pizza last night. I advised him to limit sodium to 2000 mg daily and that I will forward message to Dr. Lovena Le for advice. I also looked for an appointment with provider this week and there is no availability. I advised Ronalee Belts that someone will call back tomorrow with Dr. Tanna Furry advice. He thanked me for the call.

## 2021-01-17 NOTE — Telephone Encounter (Signed)
Pt son left a voicemail at 1:53 pm stating the patient is having some difficulties and would like for the nurse to call him back.

## 2021-01-17 NOTE — Telephone Encounter (Signed)
Patient son called in stating that he came to pick up samples and the patients legs are swollen really bad and he would like for you to give him a call

## 2021-01-18 NOTE — Telephone Encounter (Signed)
Returned call to [place].  Per Ronalee Belts legs have returned to normal.    Ronalee Belts thinks it was the prednisone taper.  No further action needed.

## 2021-01-22 IMAGING — CT CT CERVICAL SPINE W/O CM
4 of 5 series · 13 of 33 positions shown, 15 images · non-contrast
Comparison: 06/02/2020 CT neck.

CLINICAL DATA: Oropharyngeal dysphagia

EXAM:
CT CERVICAL SPINE WITHOUT CONTRAST
TECHNIQUE: Multidetector CT imaging of the cervical spine was performed without
intravenous contrast. Multiplanar CT image reconstructions were also
generated.

[Series 3: c-spine 2.00 br60 s3 axial bone · axial · 0.27mm/px · z∈[-748,-644]mm · 3 of 106 slices shown, 4 images]
[im 27/106  soft-tissue]
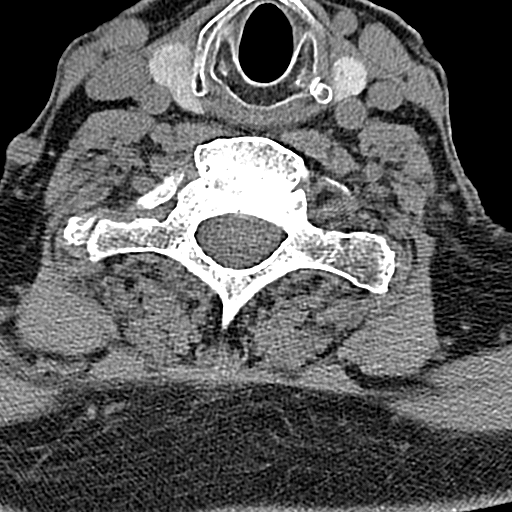
[im 27/106  bone]
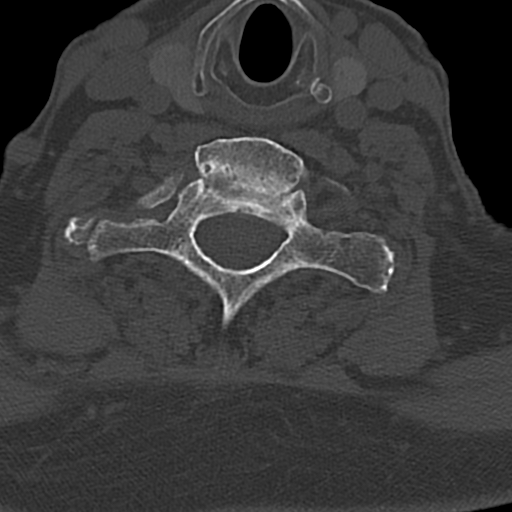
[im 53/106  bone]
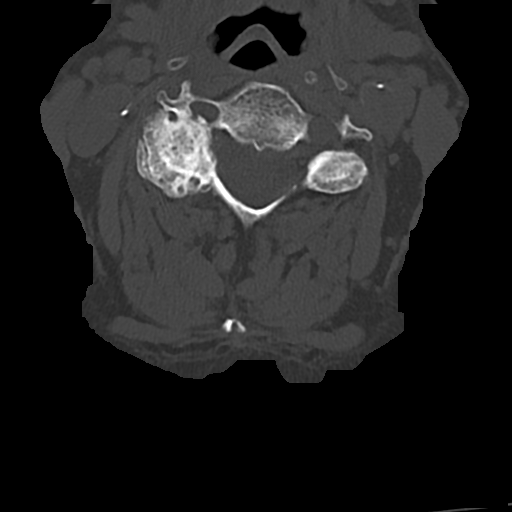
[im 79/106  bone]
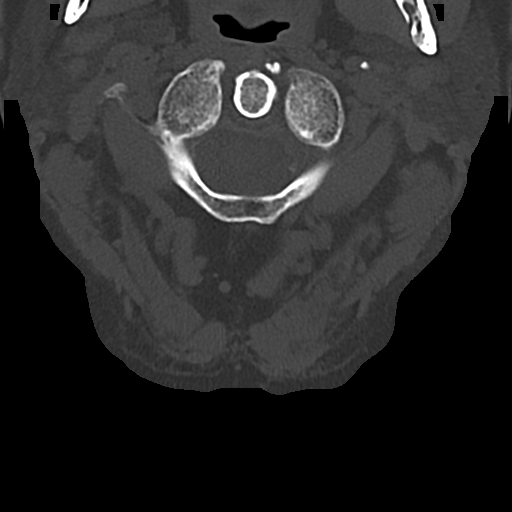

[Series 4: c-spine 2.00 br40 s3 axial (person_name) · axial · 0.27mm/px · z∈[-748,-696]mm · 2 of 106 slices shown]
[im 27/106  bone]
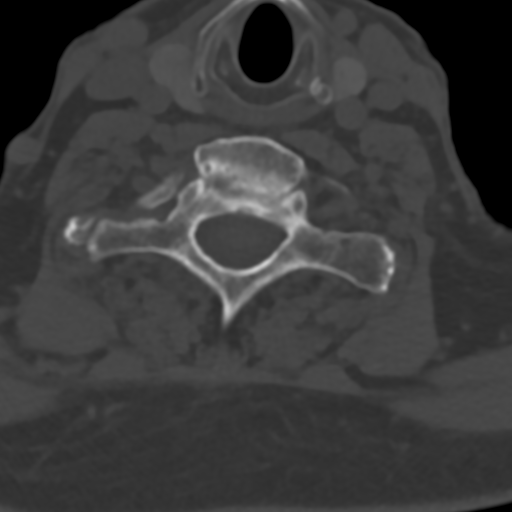
[im 53/106  bone]
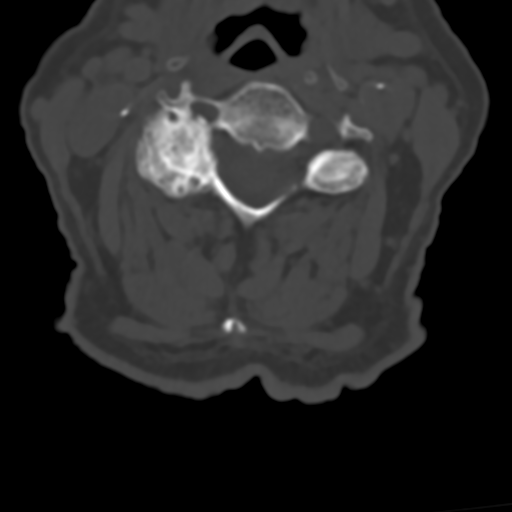

[Series 5: c-spine 2.00 br60 s3 sag bone · sagittal · 0.27mm/px · 5 of 69 slices shown, 6 images]
[im 23/69  bone]
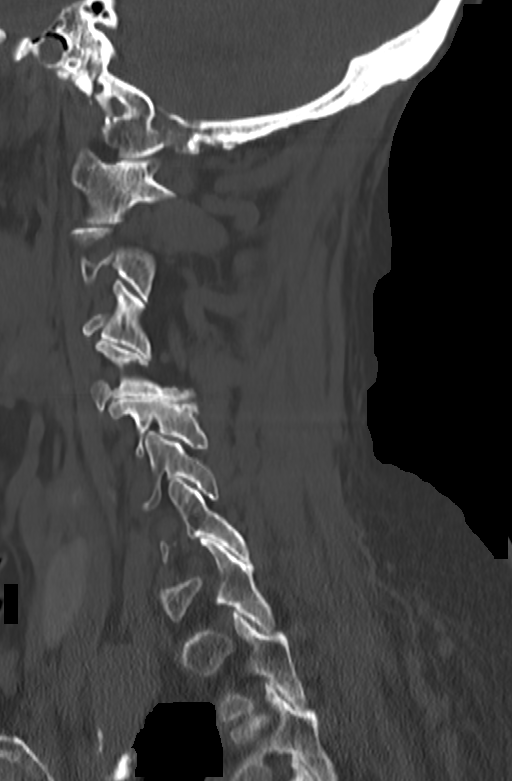
[im 29/69  bone]
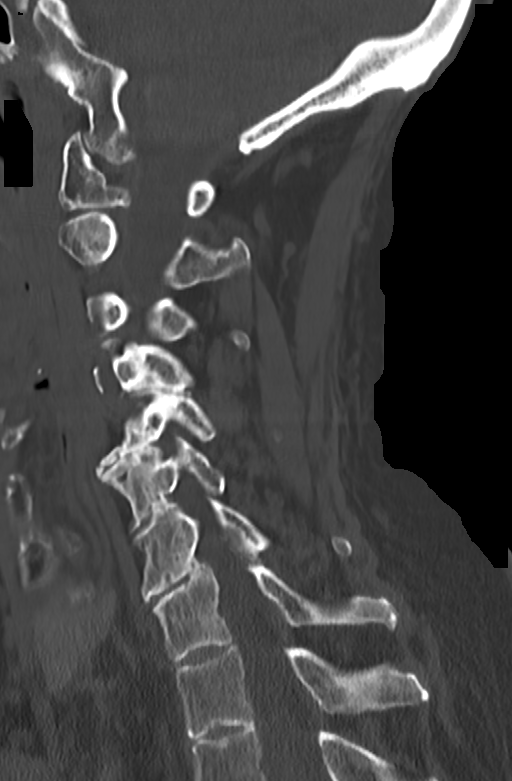
[im 35/69  soft-tissue]
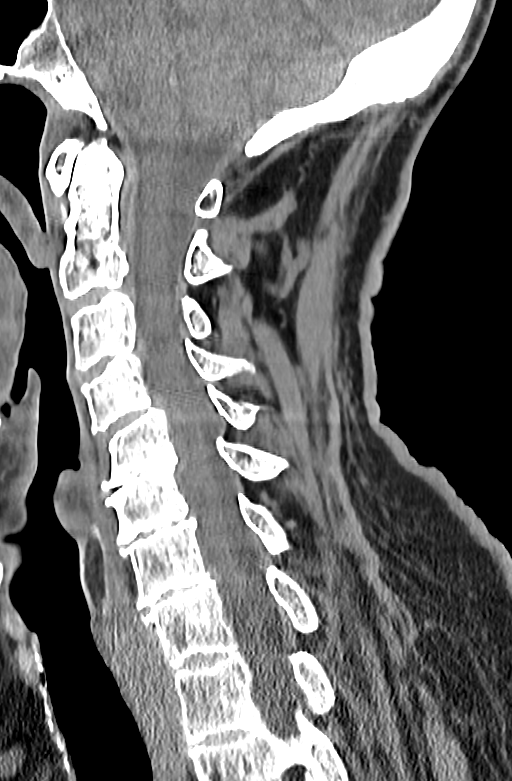
[im 35/69  bone]
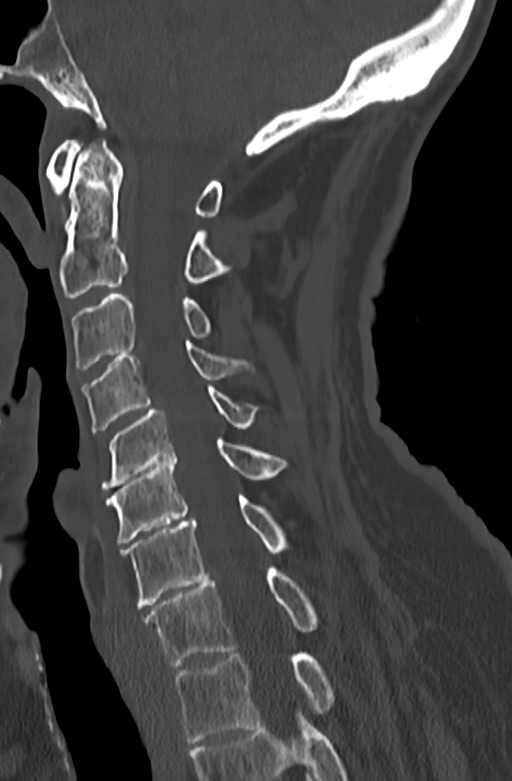
[im 40/69  bone]
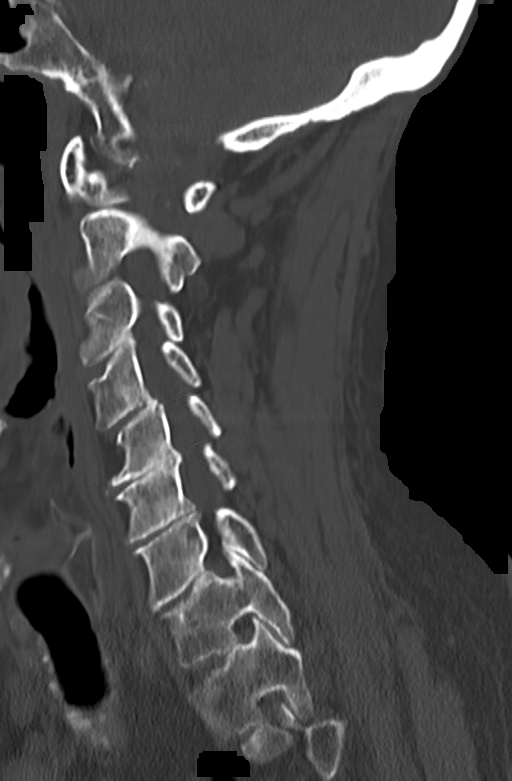
[im 46/69  bone]
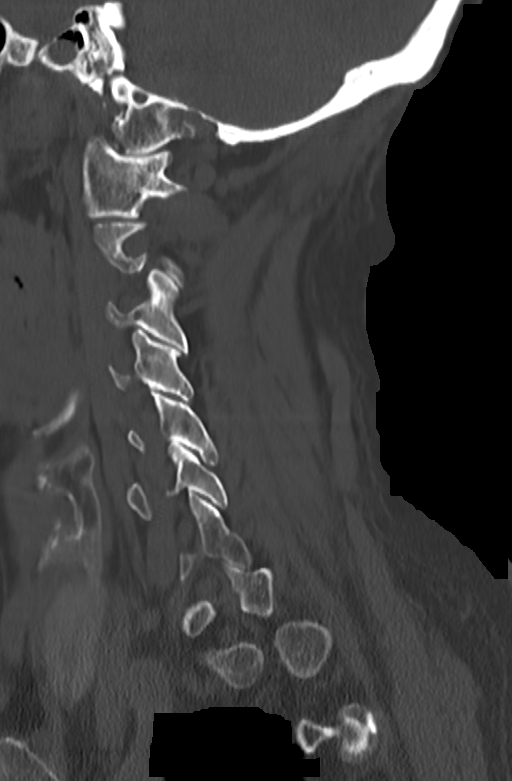

[Series 7: c-spine 2.00 hr60 s3 cor bone · coronal · 0.27mm/px · 3 of 69 slices shown]
[im 14/69  bone]
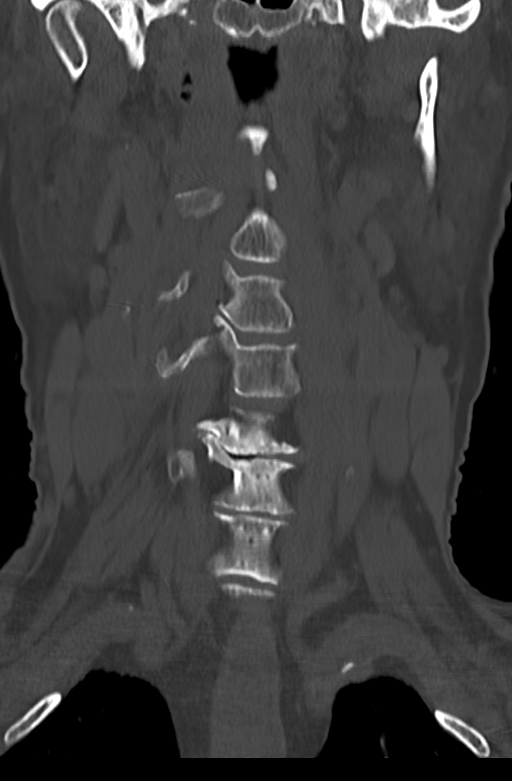
[im 28/69  bone]
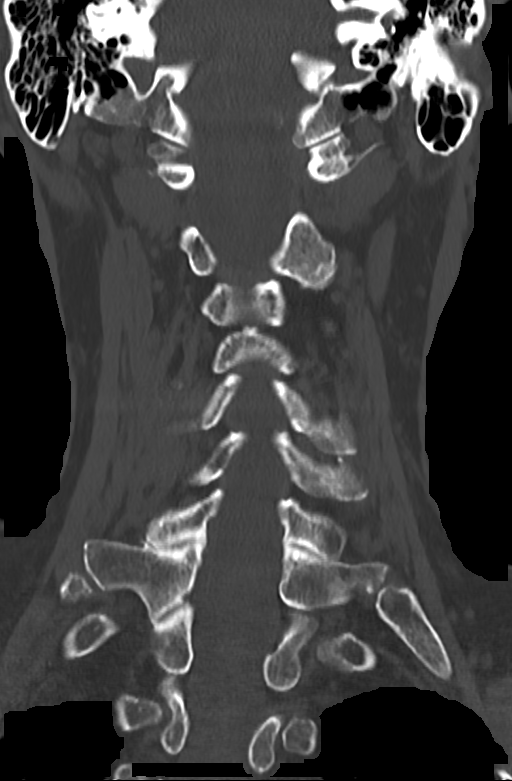
[im 41/69  bone]
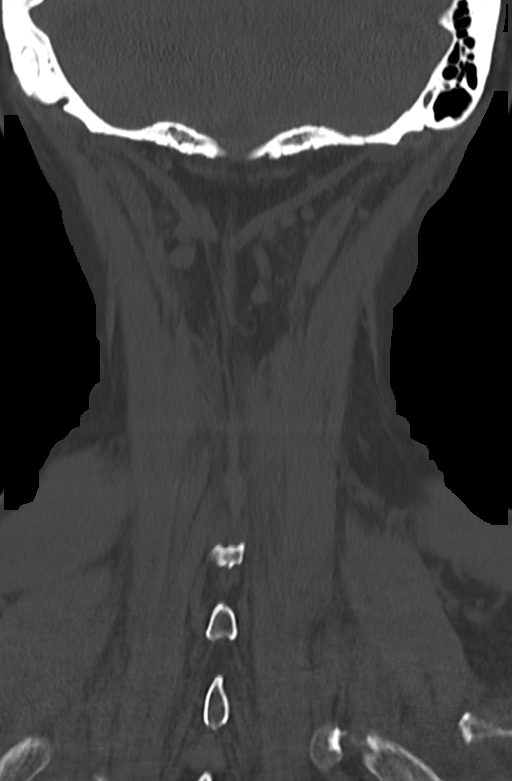

[13 of 33 positions shown; findings below may reference images not displayed]

FINDINGS: Alignment: Straightening of lordosis. Minimal grade 1 C4-5
anterolisthesis. Minimal grade 1 C5-6 retrolisthesis.

Skull base and vertebrae: No acute fracture. No primary bone lesion
or focal pathologic process.

Soft tissues and spinal canal: No prevertebral fluid or swelling. No
visible canal hematoma.

Disc levels: Multilevel endplate sclerosis and osteophytosis. Mild
C3-4 and C4-5 disc space loss. Severe C5-6, C6-7 and C7-T1 disc
space loss. Patent bony spinal canal. Multilevel uncovertebral and
facet hypertrophy. Moderate right and mild left C3-4 bony neural
foraminal narrowing. Mild bilateral C4-6 neural foraminal narrowing.
Mild left C6-7 bony neural foraminal narrowing.

Upper chest: Clear lung apices.

Other: Partially imaged left tonsil submucosal cyst, better
evaluated on recent CT neck soft tissue.
IMPRESSION: Multilevel spondylosis most prominent at the C5-6 and C6-7 levels.

Left tonsil submucosal cyst is better evaluated on recent CT neck.

## 2021-01-26 DIAGNOSIS — G1221 Amyotrophic lateral sclerosis: Secondary | ICD-10-CM | POA: Diagnosis not present

## 2021-01-26 DIAGNOSIS — Z6820 Body mass index (BMI) 20.0-20.9, adult: Secondary | ICD-10-CM | POA: Diagnosis not present

## 2021-01-26 DIAGNOSIS — E039 Hypothyroidism, unspecified: Secondary | ICD-10-CM | POA: Diagnosis not present

## 2021-01-26 DIAGNOSIS — N183 Chronic kidney disease, stage 3 unspecified: Secondary | ICD-10-CM | POA: Diagnosis not present

## 2021-01-26 DIAGNOSIS — R2689 Other abnormalities of gait and mobility: Secondary | ICD-10-CM | POA: Diagnosis not present

## 2021-01-26 DIAGNOSIS — I482 Chronic atrial fibrillation, unspecified: Secondary | ICD-10-CM | POA: Diagnosis not present

## 2021-01-26 DIAGNOSIS — R0602 Shortness of breath: Secondary | ICD-10-CM | POA: Diagnosis not present

## 2021-01-26 DIAGNOSIS — I10 Essential (primary) hypertension: Secondary | ICD-10-CM | POA: Diagnosis not present

## 2021-01-29 ENCOUNTER — Encounter: Payer: Medicare Other | Admitting: Internal Medicine

## 2021-02-01 ENCOUNTER — Telehealth: Payer: Self-pay | Admitting: Emergency Medicine

## 2021-02-01 NOTE — Telephone Encounter (Signed)
Spoke with Ronalee Belts [place](DPR) and asked to inform patient that Dr Lovena Le would like to see patient in office. Patient missed appointment 01/29/21. Multiple episodes of NSVT and treated VT . Patient did not attend visit on Monday because he did not se the need to. Expressed importance of follow-up and scheduler will call Ronalee Belts to make appointment tomorrow at 9 am to schedule appointment.

## 2021-02-19 ENCOUNTER — Ambulatory Visit (INDEPENDENT_AMBULATORY_CARE_PROVIDER_SITE_OTHER): Payer: Medicare Other

## 2021-02-19 DIAGNOSIS — I5022 Chronic systolic (congestive) heart failure: Secondary | ICD-10-CM

## 2021-02-19 DIAGNOSIS — I428 Other cardiomyopathies: Secondary | ICD-10-CM | POA: Diagnosis not present

## 2021-02-20 ENCOUNTER — Other Ambulatory Visit: Payer: Self-pay

## 2021-02-20 ENCOUNTER — Encounter: Payer: Self-pay | Admitting: Internal Medicine

## 2021-02-20 ENCOUNTER — Ambulatory Visit (INDEPENDENT_AMBULATORY_CARE_PROVIDER_SITE_OTHER): Payer: Medicare Other | Admitting: Internal Medicine

## 2021-02-20 VITALS — BP 150/84 | HR 67 | Ht 75.0 in | Wt 155.0 lb

## 2021-02-20 DIAGNOSIS — I472 Ventricular tachycardia, unspecified: Secondary | ICD-10-CM | POA: Insufficient documentation

## 2021-02-20 DIAGNOSIS — I480 Paroxysmal atrial fibrillation: Secondary | ICD-10-CM | POA: Diagnosis not present

## 2021-02-20 DIAGNOSIS — Z95810 Presence of automatic (implantable) cardiac defibrillator: Secondary | ICD-10-CM

## 2021-02-20 DIAGNOSIS — I5022 Chronic systolic (congestive) heart failure: Secondary | ICD-10-CM | POA: Diagnosis not present

## 2021-02-20 DIAGNOSIS — I10 Essential (primary) hypertension: Secondary | ICD-10-CM | POA: Insufficient documentation

## 2021-02-20 LAB — CUP PACEART REMOTE DEVICE CHECK
Battery Remaining Longevity: 30 mo
Battery Voltage: 2.96 V
Brady Statistic AP VP Percent: 0.12 %
Brady Statistic AP VS Percent: 99.52 %
Brady Statistic AS VP Percent: 0 %
Brady Statistic AS VS Percent: 0.36 %
Brady Statistic RA Percent Paced: 99.52 %
Brady Statistic RV Percent Paced: 0.13 %
Date Time Interrogation Session: 20220404001604
HighPow Impedance: 39 Ohm
HighPow Impedance: 50 Ohm
Implantable Lead Implant Date: 20090204
Implantable Lead Implant Date: 20090204
Implantable Lead Location: 753859
Implantable Lead Location: 753860
Implantable Lead Model: 5076
Implantable Lead Model: 6947
Implantable Pulse Generator Implant Date: 20161130
Lead Channel Impedance Value: 304 Ohm
Lead Channel Impedance Value: 380 Ohm
Lead Channel Impedance Value: 437 Ohm
Lead Channel Pacing Threshold Amplitude: 0.875 V
Lead Channel Pacing Threshold Amplitude: 1.125 V
Lead Channel Pacing Threshold Pulse Width: 0.4 ms
Lead Channel Pacing Threshold Pulse Width: 0.4 ms
Lead Channel Sensing Intrinsic Amplitude: 1.875 mV
Lead Channel Sensing Intrinsic Amplitude: 1.875 mV
Lead Channel Sensing Intrinsic Amplitude: 3.25 mV
Lead Channel Sensing Intrinsic Amplitude: 3.25 mV
Lead Channel Setting Pacing Amplitude: 2.25 V
Lead Channel Setting Pacing Amplitude: 2.5 V
Lead Channel Setting Pacing Pulse Width: 0.4 ms
Lead Channel Setting Sensing Sensitivity: 0.3 mV

## 2021-02-20 NOTE — Progress Notes (Signed)
HPI [name] returns today for followup. He is a pleasant 87 yo man with a h/o non-ischemic CM, chronic systolic heart failure and VT s/p ICD insertion. He has developed weakness. He has developed difficulty with his speech. It has slowed. He has been diagnosed with ALS involving predominantly the bulbar musculature. He has difficulty swallowing and coughing. Allergies  Allergen Reactions  . Eliquis [Apixaban] Swelling    Facial swelling/redness     Current Outpatient Medications  Medication Sig Dispense Refill  . amiodarone (PACERONE) 200 MG tablet Take 1 tablet (200 mg total) by mouth daily. 90 tablet 3  . finasteride (PROSCAR) 5 MG tablet Take 1 tablet by mouth daily.    Marland Kitchen levothyroxine (SYNTHROID, LEVOTHROID) 50 MCG tablet Take 50 mcg by mouth daily.    Marland Kitchen losartan (COZAAR) 25 MG tablet Take 25 mg by mouth daily.    . metoprolol tartrate (LOPRESSOR) 25 MG tablet Take 25 mg by mouth 2 (two) times daily.     . rivaroxaban (XARELTO) 20 MG TABS tablet Take 1 tablet (20 mg total) by mouth daily with supper. 30 tablet 3  . Tamsulosin HCl (FLOMAX) 0.4 MG CAPS Take 0.4 mg by mouth daily.     No current facility-administered medications for this visit.     Past Medical History:  Diagnosis Date  . Automatic implantable cardiac defibrillator in situ    MDT, implanted 2009  . CAD (coronary artery disease)    minimal by cath  . CHF NYHA class II ([facility])   . DCM (dilated cardiomyopathy) ([place])     ROS:   All systems reviewed and negative except as noted in the HPI.   Past Surgical History:  Procedure Laterality Date  . CARDIAC CATHETERIZATION  12/17/07  . EP IMPLANTABLE DEVICE N/A 10/18/2015   Procedure: ICD Generator Changeout;  Surgeon: Evans Lance, MD;  Location: [facility];  Service: Cardiovascular;  Laterality: N/A;  . S/P ICD  2009     Family History  Problem Relation Age of Onset  . Tuberculosis Father      Social History   Socioeconomic History   . Marital status: Married    Spouse name: Not on file  . Number of children: Not on file  . Years of education: Not on file  . Highest education level: Not on file  Occupational History  . Not on file  Tobacco Use  . Smoking status: Never Smoker  . Smokeless tobacco: Never Used  Vaping Use  . Vaping Use: Never used  Substance and Sexual Activity  . Alcohol use: No  . Drug use: Never  . Sexual activity: Not on file  Other Topics Concern  . Not on file  Social History Narrative   Retired.    Social Determinants of Health   Financial Resource Strain: Not on file  Food Insecurity: Not on file  Transportation Needs: Not on file  Physical Activity: Not on file  Stress: Not on file  Social Connections: Not on file  Intimate Partner Violence: Not on file     BP (!) 150/84   Pulse 67   Ht 6\' 3"  (1.905 m)   Wt 155 lb (70.3 kg)   SpO2 98%   BMI 19.37 kg/m   Physical Exam:  Chronically ill appearing NAD HEENT: Unremarkable Neck:  No JVD, no thyromegally Lymphatics:  No adenopathy Back:  No CVA tenderness Lungs:  Scattered rhonchi. HEART:  Regular rate rhythm, no murmurs, no rubs,  no clicks Abd:  soft, positive bowel sounds, no organomegally, no rebound, no guarding Ext:  2 plus pulses, no edema, no cyanosis, no clubbing Skin:  No rashes no nodules Neuro:  CN II through XII intact, motor grossly intact  EKG - nsr  DEVICE  Normal device function.  See PaceArt for details.   Assess/Plan: 1. VT - he has had several episodes of slow VT with successful ATP. I have increased the number of ATP attempts today. He will continue low dose amiodarone. 2. Chronic systolic heart failure - his symptoms are class 2B. He will continue his current meds. 3. Dyslipidemia - I asked him to stop his statin therapy today. Hopefully his muscle weakness will get better. 4. ICD - his medtronic DDD ICD is working normally with over 2 years of battery longevity. 5. PAF - he is unable to  afford Xarelto and had a rash on Eliquis. I offered him warfarin but he has decided not to take anything, I think in light of his prognosis.  Eric Peru, MD

## 2021-02-20 NOTE — Patient Instructions (Addendum)
Medication Instructions:  Your physician has recommended you make the following change in your medication:   1.  STOP Xarelto  Labwork: None ordered.  Testing/Procedures: None ordered.  Follow-Up: Your physician wants you to follow-up in: 6 months with Cristopher Peru, MD or one of the following Advanced Practice Providers on your designated Care Team:    Chanetta Marshall, NP  Tommye Standard, PA-C  Legrand Como "Jonni Sanger" [place], [place]  Remote monitoring is used to monitor your ICD from home. This monitoring reduces the number of office visits required to check your device to one time per year. It allows [place] to keep an eye on the functioning of your device to ensure it is working properly. You are scheduled for a device check from home on 05/22/2021. You may send your transmission at any time that day. If you have a wireless device, the transmission will be sent automatically. After your physician reviews your transmission, you will receive a postcard with your next transmission date.  Any Other Special Instructions Will Be Listed Below (If Applicable).  If you need a refill on your cardiac medications before your next appointment, please call your pharmacy.

## 2021-02-28 NOTE — Progress Notes (Signed)
Remote ICD transmission.   

## 2021-03-09 IMAGING — CT CT CHEST-ABD-PELV W/ CM
2 of 5 series · 10 of 46 positions shown, 11 images · IV contrast (iopamidol)
Comparison: None.

CLINICAL DATA: Low-grade musculoskeletal neoplasm. Paraneoplastic
syndrome.

EXAM:
CT CHEST, ABDOMEN, AND PELVIS WITH CONTRAST
TECHNIQUE: Multidetector CT imaging of the chest, abdomen and pelvis was
performed following the standard protocol during bolus
administration of intravenous contrast.
CONTRAST:  100mL 5GJQVQ-XYY IOPAMIDOL (5GJQVQ-XYY) INJECTION 61%
Creatinine was obtained on site at [HOSPITAL] at [REDACTED].
Results: Creatinine 1.1 mg/dL.

[Series 2: cap with 5.00 br40 s3 axial · axial · 0.53mm/px · z∈[+1315,+1855]mm · 7 of 133 slices shown, 8 images]
[im 13/133  soft-tissue]
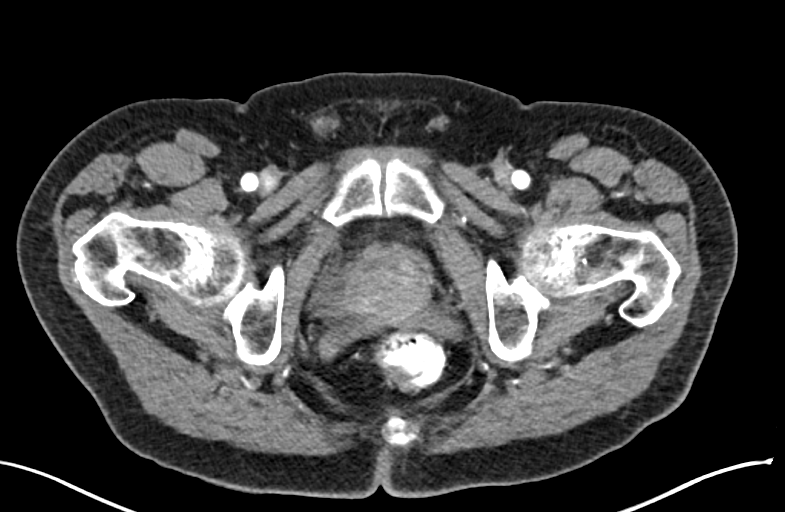
[im 13/133  bone]
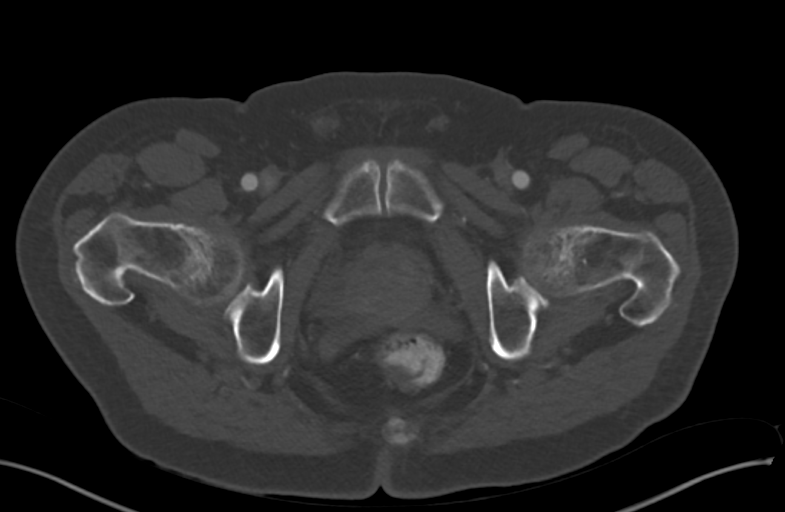
[im 37/133  soft-tissue]
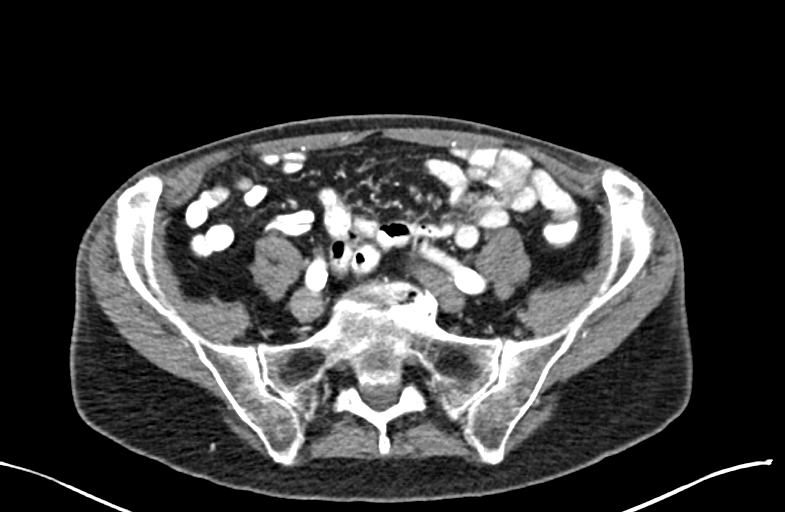
[im 49/133  soft-tissue]
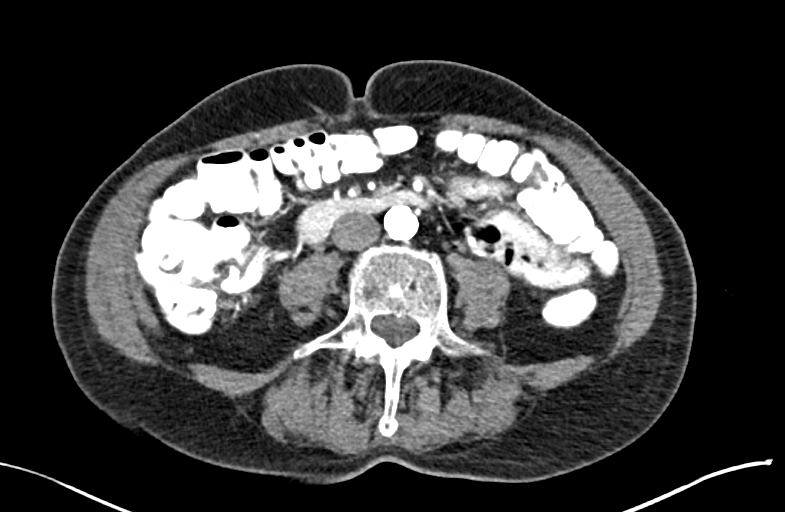
[im 73/133  soft-tissue]
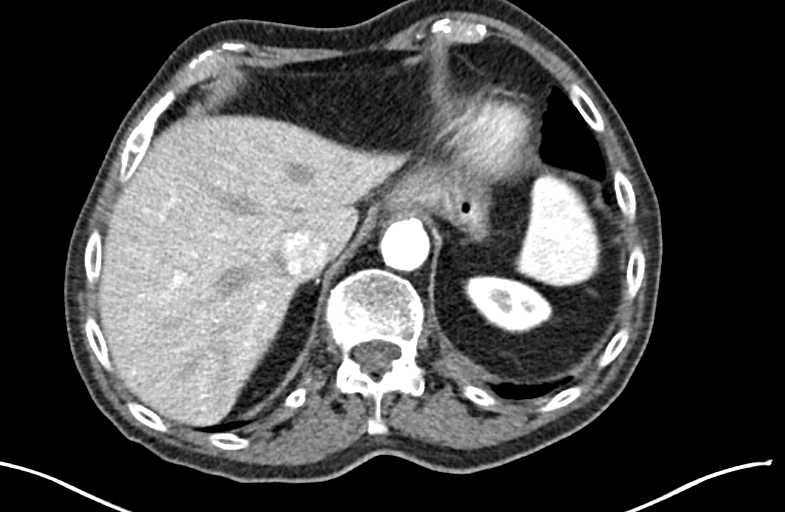
[im 85/133  soft-tissue]
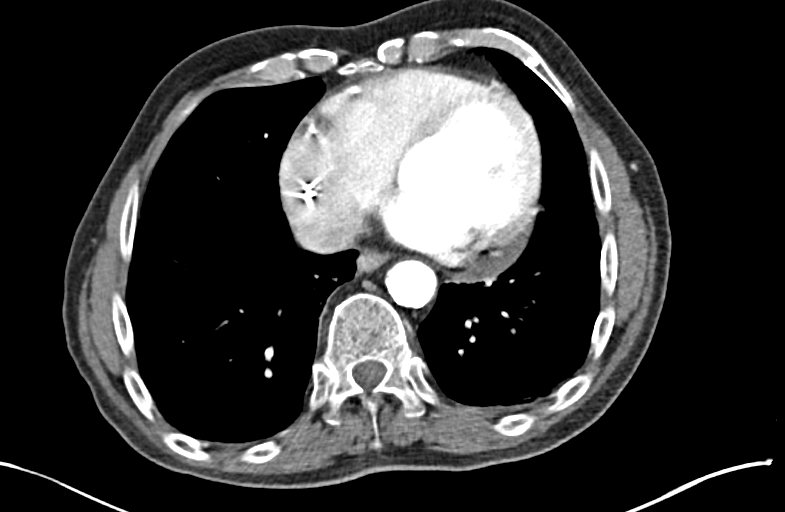
[im 97/133  soft-tissue]
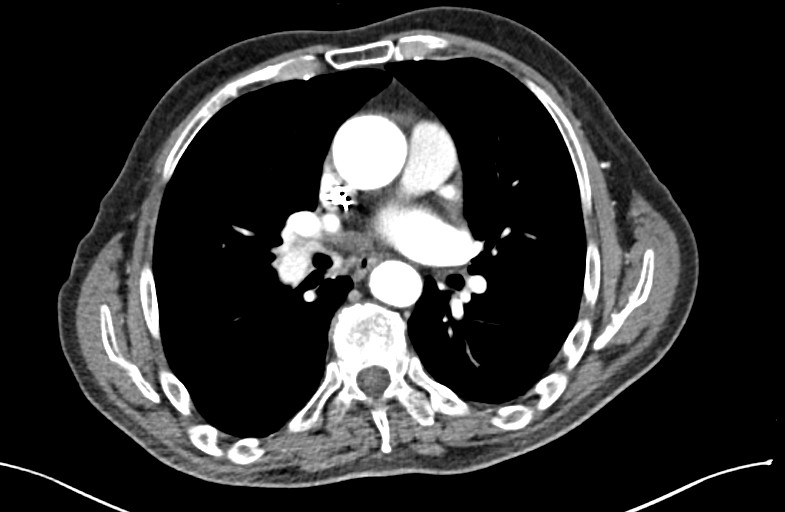
[im 121/133  soft-tissue]
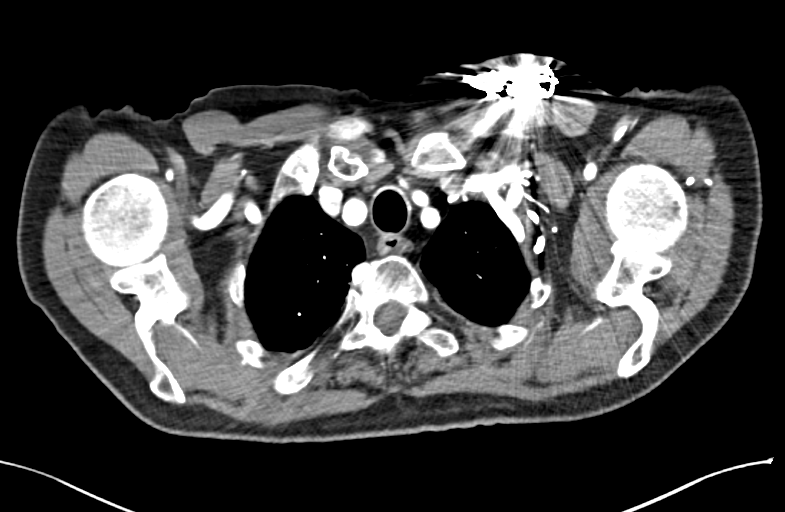

[Series 6: cap with 2.00 br40 s3 cor · coronal · 0.72mm/px · 3 of 140 slices shown]
[im 47/140  soft-tissue]
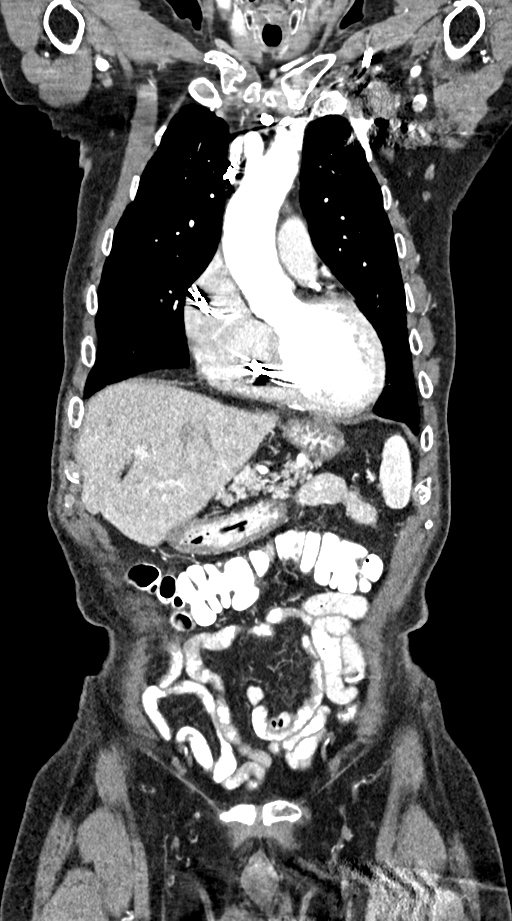
[im 62/140  soft-tissue]
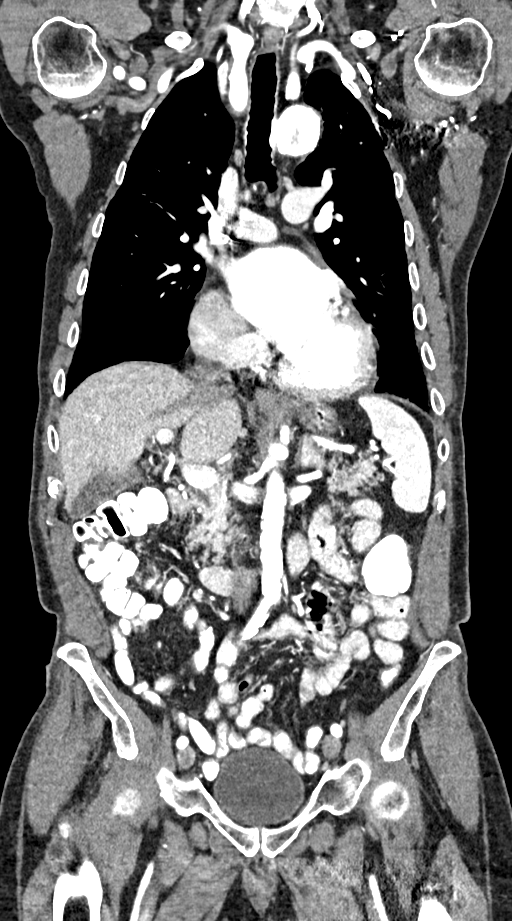
[im 78/140  soft-tissue]
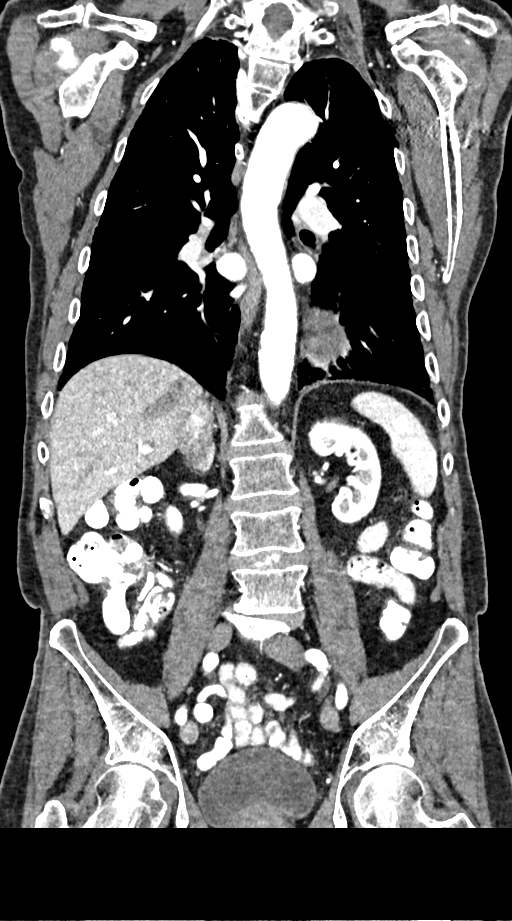

[10 of 46 positions shown; findings below may reference images not displayed]

FINDINGS: CT CHEST FINDINGS

Cardiovascular: Atherosclerotic calcification of the aorta and
coronary arteries. Ascending aorta measures at the upper limits of
normal, 3.9 cm. Heart is enlarged with left ventricular dilatation.
Small amount of dependent pericardial fluid is likely physiologic.

Mediastinum/Nodes: No pathologically enlarged mediastinal, hilar or
axillary lymph nodes. Esophagus is unremarkable.

Lungs/Pleura: An irregular nodule in the posterior aspect of the
superior segment right lower lobe measures 8.9 mm (4/83). Mild
dependent atelectasis in the lower lobes. Lungs are otherwise clear.
No pleural fluid. Airway is unremarkable.

Musculoskeletal: Degenerative changes in the spine. Old rib
fractures. No worrisome lytic or sclerotic lesions.

CT ABDOMEN PELVIS FINDINGS

Hepatobiliary: Liver and gallbladder are unremarkable. No biliary
ductal dilatation.

Pancreas: Negative.

Spleen: Negative.

Adrenals/Urinary Tract: Adrenal glands and kidneys are unremarkable.
Ureters are decompressed. Bladder is grossly unremarkable.

Stomach/Bowel: Stomach, small bowel, appendix and colon are
unremarkable.

Vascular/Lymphatic: Atherosclerotic calcification of the aorta
without aneurysm. No pathologically enlarged lymph nodes. 7 mm left
external iliac lymph node (2/106) is not enlarged by CT size
criteria.

Reproductive: Prostate is heterogeneous and enlarged.

Other: No free fluid.  Mesenteries and peritoneum are unremarkable.

Musculoskeletal: Vague rounded areas of sclerosis in the L1 through
L4 vertebral bodies are seen adjacent to the endplates, favoring a
degenerative etiology. No worrisome lytic or sclerotic lesions.
IMPRESSION: 1. Irregular right lower lobe nodule is worrisome for primary
bronchogenic carcinoma. Metastatic disease is not excluded.
2. Enlarged, heterogeneous prostate.
3. Aortic atherosclerosis ([id number].B). Coronary artery
calcification.

## 2021-04-19 DIAGNOSIS — E782 Mixed hyperlipidemia: Secondary | ICD-10-CM | POA: Diagnosis not present

## 2021-04-19 DIAGNOSIS — E7849 Other hyperlipidemia: Secondary | ICD-10-CM | POA: Diagnosis not present

## 2021-04-19 DIAGNOSIS — N183 Chronic kidney disease, stage 3 unspecified: Secondary | ICD-10-CM | POA: Diagnosis not present

## 2021-04-19 DIAGNOSIS — E039 Hypothyroidism, unspecified: Secondary | ICD-10-CM | POA: Diagnosis not present

## 2021-04-19 DIAGNOSIS — I10 Essential (primary) hypertension: Secondary | ICD-10-CM | POA: Diagnosis not present

## 2021-04-24 DIAGNOSIS — I700 Atherosclerosis of aorta: Secondary | ICD-10-CM | POA: Diagnosis not present

## 2021-04-24 DIAGNOSIS — R4702 Dysphasia: Secondary | ICD-10-CM | POA: Diagnosis not present

## 2021-04-24 DIAGNOSIS — I472 Ventricular tachycardia: Secondary | ICD-10-CM | POA: Diagnosis not present

## 2021-04-24 DIAGNOSIS — E039 Hypothyroidism, unspecified: Secondary | ICD-10-CM | POA: Diagnosis not present

## 2021-04-24 DIAGNOSIS — R911 Solitary pulmonary nodule: Secondary | ICD-10-CM | POA: Diagnosis not present

## 2021-04-24 DIAGNOSIS — I10 Essential (primary) hypertension: Secondary | ICD-10-CM | POA: Diagnosis not present

## 2021-05-22 ENCOUNTER — Ambulatory Visit (INDEPENDENT_AMBULATORY_CARE_PROVIDER_SITE_OTHER): Payer: Medicare Other

## 2021-05-22 DIAGNOSIS — I428 Other cardiomyopathies: Secondary | ICD-10-CM

## 2021-05-22 LAB — CUP PACEART REMOTE DEVICE CHECK
Battery Remaining Longevity: 30 mo
Battery Voltage: 2.95 V
Brady Statistic AP VP Percent: 0.58 %
Brady Statistic AP VS Percent: 98.33 %
Brady Statistic AS VP Percent: 0 %
Brady Statistic AS VS Percent: 1.09 %
Brady Statistic RA Percent Paced: 93.99 %
Brady Statistic RV Percent Paced: 0.56 %
Date Time Interrogation Session: 20220705012307
HighPow Impedance: 34 Ohm
HighPow Impedance: 43 Ohm
Implantable Lead Implant Date: 20090204
Implantable Lead Implant Date: 20090204
Implantable Lead Location: 753859
Implantable Lead Location: 753860
Implantable Lead Model: 5076
Implantable Lead Model: 6947
Implantable Pulse Generator Implant Date: 20161130
Lead Channel Impedance Value: 304 Ohm
Lead Channel Impedance Value: 342 Ohm
Lead Channel Impedance Value: 399 Ohm
Lead Channel Pacing Threshold Amplitude: 0.875 V
Lead Channel Pacing Threshold Amplitude: 1.25 V
Lead Channel Pacing Threshold Pulse Width: 0.4 ms
Lead Channel Pacing Threshold Pulse Width: 0.4 ms
Lead Channel Sensing Intrinsic Amplitude: 1.5 mV
Lead Channel Sensing Intrinsic Amplitude: 1.5 mV
Lead Channel Sensing Intrinsic Amplitude: 3 mV
Lead Channel Sensing Intrinsic Amplitude: 3 mV
Lead Channel Setting Pacing Amplitude: 2.5 V
Lead Channel Setting Pacing Amplitude: 2.5 V
Lead Channel Setting Pacing Pulse Width: 0.4 ms
Lead Channel Setting Sensing Sensitivity: 0.3 mV

## 2021-06-05 ENCOUNTER — Telehealth: Payer: Self-pay

## 2021-06-05 DIAGNOSIS — I5022 Chronic systolic (congestive) heart failure: Secondary | ICD-10-CM

## 2021-06-05 NOTE — Telephone Encounter (Signed)
Unscheduled manual transmission received.  Observation: TriageHFT (HF Risk) Alert: High (Ongoing). Spoke to Sharon (on [place]), who cares for patient. States he has noticed some swelling in patients feet but not significant. Also, patient has increased shortness of breath when he walks outside althougt attributed it to the heat. States when he comes back inside and sits the shortness of breath improves. Advised I will forward to Dr. Lovena Le for review and follow up with recommendations.   Currently not taking any diuretics. Compliant with medications on file.

## 2021-06-08 NOTE — Progress Notes (Signed)
Remote ICD transmission.   

## 2021-06-11 NOTE — Telephone Encounter (Signed)
Lasix 40 mg daily. Check bmp in 2 weeks.

## 2021-06-13 NOTE — Telephone Encounter (Signed)
Achille Rich on [facility]), states patient does not want to take any further medications at this point. Ronalee Belts states patient is refusing to to see doctors and start new medications due to declining health. I did advise to Ronalee Belts that I would at least like to speak to the patient to let him know Dr. Tanna Furry recommendations and what the medication is used for. Ronalee Belts states he will have the patient call back Friday morning when he goes to check on patient. Direct phone number provided to [facility].

## 2021-06-15 NOTE — Telephone Encounter (Signed)
Noted. [name] is competent to make medical decisions. GT

## 2021-06-15 NOTE — Telephone Encounter (Signed)
[name] blotts called with [name] present during phone conversation. Discussed with patient that his remote transmission reflected fluid retention and Dr. Lovena Harmon would like him to take Lasix 40 mg daily and check BMP in 2 weeks. Patient was able to answer questions with 1 word response and his response was "No". I clarified with patient and he reported again he did not want to take Lasix and have more blood work. Patient denies any shortness of breath, abdominal swelling or coughing. [name] did report patients his swelling looked like it has decreased some. Explained to patient I will forward to Dr. Lovena Harmon to inform. Advised to call back if he decides he wants to start the medication.

## 2021-08-21 ENCOUNTER — Ambulatory Visit (INDEPENDENT_AMBULATORY_CARE_PROVIDER_SITE_OTHER): Payer: Medicare Other

## 2021-08-21 DIAGNOSIS — I428 Other cardiomyopathies: Secondary | ICD-10-CM

## 2021-08-21 LAB — CUP PACEART REMOTE DEVICE CHECK
Battery Remaining Longevity: 30 mo
Battery Voltage: 2.95 V
Brady Statistic AP VP Percent: 0.51 %
Brady Statistic AP VS Percent: 99.29 %
Brady Statistic AS VP Percent: 0 %
Brady Statistic AS VS Percent: 0.2 %
Brady Statistic RA Percent Paced: 98.78 %
Brady Statistic RV Percent Paced: 0.51 %
Date Time Interrogation Session: 20221004001806
HighPow Impedance: 35 Ohm
HighPow Impedance: 44 Ohm
Implantable Lead Implant Date: 20090204
Implantable Lead Implant Date: 20090204
Implantable Lead Location: 753859
Implantable Lead Location: 753860
Implantable Lead Model: 5076
Implantable Lead Model: 6947
Implantable Pulse Generator Implant Date: 20161130
Lead Channel Impedance Value: 304 Ohm
Lead Channel Impedance Value: 342 Ohm
Lead Channel Impedance Value: 399 Ohm
Lead Channel Pacing Threshold Amplitude: 0.875 V
Lead Channel Pacing Threshold Amplitude: 1.25 V
Lead Channel Pacing Threshold Pulse Width: 0.4 ms
Lead Channel Pacing Threshold Pulse Width: 0.4 ms
Lead Channel Sensing Intrinsic Amplitude: 1.375 mV
Lead Channel Sensing Intrinsic Amplitude: 1.375 mV
Lead Channel Sensing Intrinsic Amplitude: 3.125 mV
Lead Channel Sensing Intrinsic Amplitude: 3.125 mV
Lead Channel Setting Pacing Amplitude: 2.5 V
Lead Channel Setting Pacing Amplitude: 2.5 V
Lead Channel Setting Pacing Pulse Width: 0.4 ms
Lead Channel Setting Sensing Sensitivity: 0.3 mV

## 2021-08-23 ENCOUNTER — Telehealth: Payer: Self-pay

## 2021-08-23 NOTE — Telephone Encounter (Signed)
[name], Mr. Jeff has a rare neurological condition and his prognosis is poor. I would be inclined to not may any changes to his device. It is difficult for him to get to the office. As he appears to be asymptomatic, I do not want to make any device programming changes that might result in him getting shocked for anything but life threatening arrhythmias. GT

## 2021-08-23 NOTE — Telephone Encounter (Signed)
Unscheduled transmission, normal funciton 12 NSVT, and 2 monitored VT, longest duration 57min, HR's 142-158 11 of these events are from 9/29 Optivol crossed threshold 6/2 and remains elevated, followed by HR Route for long VT  Current meds include: amiodarone 200mg  daily, Metoprolol 25mg  BID.    With regards to fluid status, notes from 06/05/21 phone encounter indicate that pt refused order for Lasix.    Attempted to reach patient to assess symptoms and med compliance.  - phone number on record is for [place] (DPR on file).  Left VM requesting callback with device clinic # and hours.        [employer]

## 2021-08-23 NOTE — Telephone Encounter (Signed)
Pt caregiver Ronalee Belts, returned phone call (DPR on file).   He reports that if pt was symptomatic he denies it and will not report it.  Pt has diclosed to him that he does not want to take any more meds or attend any more MD appts and just wishes to die.    Explained to caregiver function of ICD is that it could treat him.  Advised pt will need to have a f/u appt with Dr. Lovena Le to disucss his wishes in regard to device.  He indicated that he can convince pt to come for appt.  Scheduled f/u appt on 10/11 with Dr. Lovena Le

## 2021-08-28 ENCOUNTER — Other Ambulatory Visit: Payer: Self-pay

## 2021-08-28 ENCOUNTER — Encounter: Payer: Self-pay | Admitting: Internal Medicine

## 2021-08-28 ENCOUNTER — Ambulatory Visit (INDEPENDENT_AMBULATORY_CARE_PROVIDER_SITE_OTHER): Payer: Medicare Other | Admitting: Internal Medicine

## 2021-08-28 VITALS — BP 140/74 | HR 87 | Ht 75.0 in | Wt 143.0 lb

## 2021-08-28 DIAGNOSIS — I472 Ventricular tachycardia, unspecified: Secondary | ICD-10-CM | POA: Diagnosis not present

## 2021-08-28 DIAGNOSIS — I5022 Chronic systolic (congestive) heart failure: Secondary | ICD-10-CM

## 2021-08-28 NOTE — Progress Notes (Signed)
HPI Mr .[name] returns today for followup. He is a pleasant 87 yo man with a non-ischemic CM, VT, s/p ICD insertion. The patient has developed ALS involving the bulbar musculature. He has not had more VT. He would like to have his ICD turned off. He has difficulty swallowing.  Allergies  Allergen Reactions   Eliquis [Apixaban] Swelling    Facial swelling/redness     Current Outpatient Medications  Medication Sig Dispense Refill   amiodarone (PACERONE) 200 MG tablet Take 1 tablet (200 mg total) by mouth daily. 90 tablet 3   finasteride (PROSCAR) 5 MG tablet Take 1 tablet by mouth daily.     levothyroxine (SYNTHROID, LEVOTHROID) 50 MCG tablet Take 50 mcg by mouth daily.     losartan (COZAAR) 25 MG tablet Take 25 mg by mouth daily.     metoprolol tartrate (LOPRESSOR) 25 MG tablet Take 25 mg by mouth 2 (two) times daily.      Tamsulosin HCl (FLOMAX) 0.4 MG CAPS Take 0.4 mg by mouth daily.     No current facility-administered medications for this visit.     Past Medical History:  Diagnosis Date   Automatic implantable cardiac defibrillator in situ    MDT, implanted 2009   CAD (coronary artery disease)    minimal by cath   CHF NYHA class II ([facility])    DCM (dilated cardiomyopathy) ([place])     ROS:   All systems reviewed and negative except as noted in the HPI.   Past Surgical History:  Procedure Laterality Date   CARDIAC CATHETERIZATION  12/17/07   EP IMPLANTABLE DEVICE N/A 10/18/2015   Procedure: ICD Generator Changeout;  Surgeon: Evans Lance, MD;  Location: [facility];  Service: Cardiovascular;  Laterality: N/A;   S/P ICD  2009     Family History  Problem Relation Age of Onset   Tuberculosis Father      Social History   Socioeconomic History   Marital status: Married    Spouse name: Not on file   Number of children: Not on file   Years of education: Not on file   Highest education level: Not on file  Occupational History   Not on file  Tobacco  Use   Smoking status: Never   Smokeless tobacco: Never  Vaping Use   Vaping Use: Never used  Substance and Sexual Activity   Alcohol use: No   Drug use: Never   Sexual activity: Not on file  Other Topics Concern   Not on file  Social History Narrative   Retired.    Social Determinants of Health   Financial Resource Strain: Not on file  Food Insecurity: Not on file  Transportation Needs: Not on file  Physical Activity: Not on file  Stress: Not on file  Social Connections: Not on file  Intimate Partner Violence: Not on file     BP 140/74   Pulse 87   Ht 6\' 3"  (1.905 m)   Wt 143 lb (64.9 kg)   SpO2 97%   BMI 17.87 kg/m   Physical Exam:  Well appearing NAD HEENT: Unremarkable Neck:  No JVD, no thyromegally Lymphatics:  No adenopathy Back:  No CVA tenderness Lungs:  Clear with no wheezes HEART:  Regular rate rhythm, no murmurs, no rubs, no clicks Abd:  soft, positive bowel sounds, no organomegally, no rebound, no guarding Ext:  2 plus pulses, no edema, no cyanosis, no clubbing Skin:  No rashes no nodules Neuro:  CN II through XII intact, motor grossly intact  DEVICE  Normal device function.  See PaceArt for details. His tachy therapies have been turned off.   Assess/Plan:  Bulbar ALS - his prognosis is very poor. I have turned his ICD therapies off. Pacing will remain on. ICD - he has not had more VT. His device was working normally.  VT - he will continue amiodarone therapy, 200 mg daily. HTN - his bp is minimall elevated. We will follow. Carleene Overlie Karrin Eisenmenger,MD

## 2021-08-28 NOTE — Patient Instructions (Signed)
Medication Instructions:  Your physician recommends that you continue on your current medications as directed. Please refer to the Current Medication list given to you today.  Labwork: None ordered.  Testing/Procedures: None ordered.  Follow-Up: Your physician wants you to follow-up in: as needed with Gregg Taylor, MD    Any Other Special Instructions Will Be Listed Below (If Applicable).  If you need a refill on your cardiac medications before your next appointment, please call your pharmacy.       

## 2021-08-29 NOTE — Progress Notes (Signed)
Remote ICD transmission.   

## 2021-10-16 ENCOUNTER — Other Ambulatory Visit: Payer: Self-pay | Admitting: Internal Medicine

## 2021-12-09 ENCOUNTER — Other Ambulatory Visit: Payer: Self-pay

## 2021-12-09 ENCOUNTER — Emergency Department (HOSPITAL_COMMUNITY): Payer: Medicare Other

## 2021-12-09 ENCOUNTER — Inpatient Hospital Stay (HOSPITAL_COMMUNITY)
Admission: EM | Admit: 2021-12-09 | Discharge: 2021-12-11 | DRG: 871 | Disposition: A | Discharge status: Hospice | Payer: Medicare Other | Attending: Internal Medicine | Admitting: Internal Medicine

## 2021-12-09 ENCOUNTER — Encounter (HOSPITAL_COMMUNITY): Payer: Self-pay

## 2021-12-09 DIAGNOSIS — Z79890 Hormone replacement therapy (postmenopausal): Secondary | ICD-10-CM

## 2021-12-09 DIAGNOSIS — I420 Dilated cardiomyopathy: Secondary | ICD-10-CM | POA: Diagnosis present

## 2021-12-09 DIAGNOSIS — R64 Cachexia: Secondary | ICD-10-CM | POA: Diagnosis present

## 2021-12-09 DIAGNOSIS — D649 Anemia, unspecified: Secondary | ICD-10-CM | POA: Diagnosis present

## 2021-12-09 DIAGNOSIS — J9601 Acute respiratory failure with hypoxia: Secondary | ICD-10-CM | POA: Diagnosis not present

## 2021-12-09 DIAGNOSIS — E876 Hypokalemia: Secondary | ICD-10-CM | POA: Diagnosis present

## 2021-12-09 DIAGNOSIS — N400 Enlarged prostate without lower urinary tract symptoms: Secondary | ICD-10-CM | POA: Diagnosis present

## 2021-12-09 DIAGNOSIS — I2510 Atherosclerotic heart disease of native coronary artery without angina pectoris: Secondary | ICD-10-CM | POA: Diagnosis present

## 2021-12-09 DIAGNOSIS — Z95810 Presence of automatic (implantable) cardiac defibrillator: Secondary | ICD-10-CM

## 2021-12-09 DIAGNOSIS — Z20822 Contact with and (suspected) exposure to covid-19: Secondary | ICD-10-CM | POA: Diagnosis present

## 2021-12-09 DIAGNOSIS — Z7189 Other specified counseling: Secondary | ICD-10-CM | POA: Diagnosis not present

## 2021-12-09 DIAGNOSIS — R0602 Shortness of breath: Secondary | ICD-10-CM | POA: Diagnosis not present

## 2021-12-09 DIAGNOSIS — Z66 Do not resuscitate: Secondary | ICD-10-CM | POA: Diagnosis present

## 2021-12-09 DIAGNOSIS — R0902 Hypoxemia: Secondary | ICD-10-CM | POA: Diagnosis not present

## 2021-12-09 DIAGNOSIS — A419 Sepsis, unspecified organism: Secondary | ICD-10-CM | POA: Diagnosis present

## 2021-12-09 DIAGNOSIS — R130 Aphagia: Secondary | ICD-10-CM

## 2021-12-09 DIAGNOSIS — R404 Transient alteration of awareness: Secondary | ICD-10-CM | POA: Diagnosis not present

## 2021-12-09 DIAGNOSIS — E039 Hypothyroidism, unspecified: Secondary | ICD-10-CM | POA: Diagnosis present

## 2021-12-09 DIAGNOSIS — J690 Pneumonitis due to inhalation of food and vomit: Secondary | ICD-10-CM | POA: Diagnosis not present

## 2021-12-09 DIAGNOSIS — J9611 Chronic respiratory failure with hypoxia: Secondary | ICD-10-CM | POA: Diagnosis not present

## 2021-12-09 DIAGNOSIS — I509 Heart failure, unspecified: Secondary | ICD-10-CM | POA: Diagnosis not present

## 2021-12-09 DIAGNOSIS — Z515 Encounter for palliative care: Secondary | ICD-10-CM | POA: Diagnosis not present

## 2021-12-09 DIAGNOSIS — Z888 Allergy status to other drugs, medicaments and biological substances status: Secondary | ICD-10-CM

## 2021-12-09 DIAGNOSIS — R627 Adult failure to thrive: Secondary | ICD-10-CM | POA: Diagnosis present

## 2021-12-09 DIAGNOSIS — Z7401 Bed confinement status: Secondary | ICD-10-CM | POA: Diagnosis not present

## 2021-12-09 DIAGNOSIS — Z681 Body mass index (BMI) 19 or less, adult: Secondary | ICD-10-CM

## 2021-12-09 DIAGNOSIS — J90 Pleural effusion, not elsewhere classified: Secondary | ICD-10-CM | POA: Diagnosis not present

## 2021-12-09 DIAGNOSIS — E46 Unspecified protein-calorie malnutrition: Secondary | ICD-10-CM | POA: Diagnosis not present

## 2021-12-09 DIAGNOSIS — R1310 Dysphagia, unspecified: Secondary | ICD-10-CM | POA: Diagnosis present

## 2021-12-09 DIAGNOSIS — I428 Other cardiomyopathies: Secondary | ICD-10-CM

## 2021-12-09 DIAGNOSIS — R5383 Other fatigue: Secondary | ICD-10-CM | POA: Diagnosis not present

## 2021-12-09 DIAGNOSIS — I480 Paroxysmal atrial fibrillation: Secondary | ICD-10-CM | POA: Diagnosis present

## 2021-12-09 DIAGNOSIS — E43 Unspecified severe protein-calorie malnutrition: Secondary | ICD-10-CM | POA: Diagnosis present

## 2021-12-09 DIAGNOSIS — I110 Hypertensive heart disease with heart failure: Secondary | ICD-10-CM | POA: Diagnosis present

## 2021-12-09 DIAGNOSIS — G1221 Amyotrophic lateral sclerosis: Secondary | ICD-10-CM | POA: Diagnosis not present

## 2021-12-09 DIAGNOSIS — N401 Enlarged prostate with lower urinary tract symptoms: Secondary | ICD-10-CM | POA: Diagnosis not present

## 2021-12-09 DIAGNOSIS — I5022 Chronic systolic (congestive) heart failure: Secondary | ICD-10-CM | POA: Diagnosis present

## 2021-12-09 DIAGNOSIS — M7989 Other specified soft tissue disorders: Secondary | ICD-10-CM | POA: Diagnosis present

## 2021-12-09 DIAGNOSIS — I472 Ventricular tachycardia, unspecified: Secondary | ICD-10-CM | POA: Diagnosis not present

## 2021-12-09 DIAGNOSIS — R531 Weakness: Secondary | ICD-10-CM | POA: Diagnosis not present

## 2021-12-09 DIAGNOSIS — J189 Pneumonia, unspecified organism: Secondary | ICD-10-CM

## 2021-12-09 DIAGNOSIS — R54 Age-related physical debility: Secondary | ICD-10-CM | POA: Diagnosis present

## 2021-12-09 DIAGNOSIS — I429 Cardiomyopathy, unspecified: Secondary | ICD-10-CM | POA: Diagnosis not present

## 2021-12-09 DIAGNOSIS — K5900 Constipation, unspecified: Secondary | ICD-10-CM | POA: Diagnosis not present

## 2021-12-09 DIAGNOSIS — R680 Hypothermia, not associated with low environmental temperature: Secondary | ICD-10-CM | POA: Diagnosis present

## 2021-12-09 DIAGNOSIS — Z831 Family history of other infectious and parasitic diseases: Secondary | ICD-10-CM

## 2021-12-09 DIAGNOSIS — Z79899 Other long term (current) drug therapy: Secondary | ICD-10-CM

## 2021-12-09 DIAGNOSIS — I10 Essential (primary) hypertension: Secondary | ICD-10-CM | POA: Diagnosis not present

## 2021-12-09 DIAGNOSIS — R6520 Severe sepsis without septic shock: Secondary | ICD-10-CM | POA: Diagnosis not present

## 2021-12-09 LAB — COMPREHENSIVE METABOLIC PANEL
ALT: 28 U/L (ref 0–44)
AST: 21 U/L (ref 15–41)
Albumin: 2.6 g/dL — ABNORMAL LOW (ref 3.5–5.0)
Alkaline Phosphatase: 65 U/L (ref 38–126)
Anion gap: 8 (ref 5–15)
BUN: 22 mg/dL (ref 8–23)
CO2: 34 mmol/L — ABNORMAL HIGH (ref 22–32)
Calcium: 8.5 mg/dL — ABNORMAL LOW (ref 8.9–10.3)
Chloride: 97 mmol/L — ABNORMAL LOW (ref 98–111)
Creatinine, Ser: 0.84 mg/dL (ref 0.61–1.24)
GFR, Estimated: >60 mL/min (ref >60)
Glucose, Bld: 148 mg/dL — ABNORMAL HIGH (ref 70–99)
Potassium: 3.3 mmol/L — ABNORMAL LOW (ref 3.5–5.1)
Sodium: 139 mmol/L (ref 135–145)
Total Bilirubin: 1.2 mg/dL (ref 0.3–1.2)
Total Protein: 5.9 g/dL — ABNORMAL LOW (ref 6.5–8.1)

## 2021-12-09 LAB — CBC WITH DIFFERENTIAL/PLATELET
Abs Immature Granulocytes: 0.08 10*3/uL — ABNORMAL HIGH (ref 0.00–0.07)
Basophils Absolute: 0 10*3/uL (ref 0.0–0.1)
Basophils Relative: 0 %
Eosinophils Absolute: 0 10*3/uL (ref 0.0–0.5)
Eosinophils Relative: 0 %
HCT: 37.7 % — ABNORMAL LOW (ref 39.0–52.0)
Hemoglobin: 11.7 g/dL — ABNORMAL LOW (ref 13.0–17.0)
Immature Granulocytes: 1 %
Lymphocytes Relative: 2 %
Lymphs Abs: 0.4 10*3/uL — ABNORMAL LOW (ref 0.7–4.0)
MCH: 29.4 pg (ref 26.0–34.0)
MCHC: 31 g/dL (ref 30.0–36.0)
MCV: 94.7 fL (ref 80.0–100.0)
Monocytes Absolute: 0.5 10*3/uL (ref 0.1–1.0)
Monocytes Relative: 3 %
Neutro Abs: 16.2 10*3/uL — ABNORMAL HIGH (ref 1.7–7.7)
Neutrophils Relative %: 94 %
Platelets: 322 10*3/uL (ref 150–400)
RBC: 3.98 MIL/uL — ABNORMAL LOW (ref 4.22–5.81)
RDW: 13.5 % (ref 11.5–15.5)
WBC: 17.2 10*3/uL — ABNORMAL HIGH (ref 4.0–10.5)
nRBC: 0 % (ref 0.0–0.2)

## 2021-12-09 LAB — RESP PANEL BY RT-PCR (FLU A&B, COVID) ARPGX2
Influenza A by PCR: NEGATIVE
Influenza B by PCR: NEGATIVE
SARS Coronavirus 2 by RT PCR: NEGATIVE

## 2021-12-09 LAB — PHOSPHORUS: Phosphorus: 3.2 mg/dL (ref 2.5–4.6)

## 2021-12-09 LAB — MAGNESIUM: Magnesium: 1.8 mg/dL (ref 1.7–2.4)

## 2021-12-09 MED ORDER — IPRATROPIUM-ALBUTEROL 0.5-2.5 (3) MG/3ML IN SOLN
3.0000 mL | Freq: Four times a day (QID) | RESPIRATORY_TRACT | Status: DC | PRN
  Administered 2021-12-09: 3 mL via RESPIRATORY_TRACT
  Filled 2021-12-09: qty 3

## 2021-12-09 MED ORDER — SODIUM CHLORIDE 0.9 % IV SOLN: 500.0000 mg | INTRAVENOUS | Status: DC

## 2021-12-09 MED ORDER — SODIUM CHLORIDE 0.9 % IV SOLN
3.0000 g | Freq: Three times a day (TID) | INTRAVENOUS | Status: DC
  Administered 2021-12-09 – 2021-12-11 (×7): 3 g via INTRAVENOUS
  Filled 2021-12-09: qty 3
  Filled 2021-12-09 (×2): qty 8
  Filled 2021-12-09: qty 3
  Filled 2021-12-09 (×6): qty 8

## 2021-12-09 MED ORDER — LEVOTHYROXINE SODIUM 50 MCG PO TABS: 50.0000 ug | ORAL_TABLET | Freq: Every day | ORAL | Status: DC

## 2021-12-09 MED ORDER — AMIODARONE HCL 200 MG PO TABS: 200.0000 mg | ORAL_TABLET | Freq: Every day | ORAL | Status: DC

## 2021-12-09 MED ORDER — ONDANSETRON HCL 4 MG PO TABS: 4.0000 mg | ORAL_TABLET | Freq: Four times a day (QID) | ORAL | Status: DC | PRN

## 2021-12-09 MED ORDER — METOPROLOL TARTRATE 25 MG PO TABS: 25.0000 mg | ORAL_TABLET | Freq: Two times a day (BID) | ORAL | Status: DC

## 2021-12-09 MED ORDER — ACETAMINOPHEN 325 MG PO TABS
650.0000 mg | ORAL_TABLET | Freq: Four times a day (QID) | ORAL | Status: DC | PRN
  Administered 2021-12-10 – 2021-12-11 (×2): 650 mg via ORAL
  Filled 2021-12-09 (×2): qty 2

## 2021-12-09 MED ORDER — POTASSIUM CHLORIDE 10 MEQ/100ML IV SOLN
10.0000 meq | INTRAVENOUS | Status: AC
  Administered 2021-12-09 (×3): 10 meq via INTRAVENOUS
  Filled 2021-12-09 (×3): qty 100

## 2021-12-09 MED ORDER — METOPROLOL TARTRATE 5 MG/5ML IV SOLN
2.5000 mg | Freq: Two times a day (BID) | INTRAVENOUS | Status: DC
  Administered 2021-12-09 – 2021-12-11 (×4): 2.5 mg via INTRAVENOUS
  Filled 2021-12-09 (×5): qty 5

## 2021-12-09 MED ORDER — SCOPOLAMINE 1 MG/3DAYS TD PT72
1.0000 | MEDICATED_PATCH | TRANSDERMAL | Status: DC
  Administered 2021-12-09: 1.5 mg via TRANSDERMAL
  Filled 2021-12-09 (×3): qty 1

## 2021-12-09 MED ORDER — SODIUM CHLORIDE 0.9 % IV SOLN: INTRAVENOUS | Status: AC

## 2021-12-09 MED ORDER — ACETAMINOPHEN 650 MG RE SUPP: 650.0000 mg | Freq: Four times a day (QID) | RECTAL | Status: DC | PRN

## 2021-12-09 MED ORDER — SODIUM CHLORIDE 0.9 % IV SOLN
2.0000 g | Freq: Once | INTRAVENOUS | Status: DC
  Administered 2021-12-09: 2 g via INTRAVENOUS
  Filled 2021-12-09: qty 20

## 2021-12-09 MED ORDER — BUDESONIDE 0.5 MG/2ML IN SUSP
0.5000 mg | Freq: Two times a day (BID) | RESPIRATORY_TRACT | Status: DC
  Administered 2021-12-09 – 2021-12-11 (×5): 0.5 mg via RESPIRATORY_TRACT
  Filled 2021-12-09 (×5): qty 2

## 2021-12-09 MED ORDER — TAMSULOSIN HCL 0.4 MG PO CAPS: 0.4000 mg | ORAL_CAPSULE | Freq: Every day | ORAL | Status: DC

## 2021-12-09 MED ORDER — ENOXAPARIN SODIUM 40 MG/0.4ML IJ SOSY
40.0000 mg | PREFILLED_SYRINGE | INTRAMUSCULAR | Status: DC
  Administered 2021-12-09 – 2021-12-11 (×3): 40 mg via SUBCUTANEOUS
  Filled 2021-12-09 (×3): qty 0.4

## 2021-12-09 MED ORDER — ONDANSETRON HCL 4 MG/2ML IJ SOLN: 4.0000 mg | Freq: Four times a day (QID) | INTRAMUSCULAR | Status: DC | PRN

## 2021-12-09 NOTE — ED Provider Notes (Addendum)
[facility] EMERGENCY DEPARTMENT Provider Note   CSN: [id number] Arrival date & time: 12/09/21  8413     History  Chief complaint: Weakness  [name] is a 88 y.o. male.  The history is provided by a relative. The history is limited by the condition of the patient (Unable to talk).  He has history of hypertension, paroxysmal atrial fibrillation, ventricular tachycardia, systolic heart failure, ALS and comes in complaining of some upper abdominal pain for about the last week.  He has also been having difficulty swallowing.  There is has been no fever or chills.  Family is concerned because she has been unable to eat.  He has not seen a physician in several months and they do not have any home services.  He does have oxygen at home.  He does not want extraordinary measures and has signed DNR paperwork.  He does have an implanted pacemaker-defibrillator, but the defibrillator function has been turned off while maintaining the pacemaker function.  He has noted some increased swelling in his feet over the last 2 weeks.   Home Medications Prior to Admission medications   Medication Sig Start Date End Date Taking? Authorizing Provider  amiodarone (PACERONE) 200 MG tablet Take 1 tablet (200 mg total) by mouth daily. 10/16/21   Evans Lance, MD  finasteride (PROSCAR) 5 MG tablet Take 1 tablet by mouth daily. 12/09/17   [provider]  levothyroxine (SYNTHROID, LEVOTHROID) 50 MCG tablet Take 50 mcg by mouth daily.    [provider]  losartan (COZAAR) 25 MG tablet Take 25 mg by mouth daily.    [provider]  metoprolol tartrate (LOPRESSOR) 25 MG tablet Take 25 mg by mouth 2 (two) times daily.     [provider]  Tamsulosin HCl (FLOMAX) 0.4 MG CAPS Take 0.4 mg by mouth daily.    [provider]      Allergies    Eliquis [apixaban]    Review of Systems   Review of Systems  Unable to perform ROS: Patient nonverbal   Physical Exam Updated  Vital Signs BP 120/79    Pulse 66    Resp 15    Ht 6\' 3"  (1.905 m)    Wt 63 kg    SpO2 (!) 88%    BMI 17.36 kg/m  Physical Exam Vitals and nursing note reviewed.  Chronically ill-appearing 88 year old male in no acute distress. Vital signs are normal. Oxygen saturation is 88%, which is hypoxic. Head is normocephalic and atraumatic. PERRLA, EOMI. Oropharynx is clear.  Mucous membranes are dry. Neck is nontender and supple without adenopathy or JVD. Back is nontender and there is no CVA tenderness. Lungs are clear without rales, wheezes, or rhonchi. Chest is nontender. Heart has regular rate and rhythm without murmur. Abdomen is soft, flat, with mild epigastric tenderness.  There is no rebound or guarding. Extremities show generalized muscle wasting.  There is 2+ pedal edema. Skin is warm and dry without rash. Neurologic: Awake and attempts to speak, but is not able to pronounce any words.  Generalized weakness with poor fine motor control.  ED Results / Procedures / Treatments   Labs (all labs ordered are listed, but only abnormal results are displayed) Labs Reviewed  COMPREHENSIVE METABOLIC PANEL - Abnormal; Notable for the following components:      Result Value   Potassium 3.3 (*)    Chloride 97 (*)    CO2 34 (*)    Glucose, Bld 148 (*)  Calcium 8.5 (*)    Total Protein 5.9 (*)    Albumin 2.6 (*)    All other components within normal limits  CBC WITH DIFFERENTIAL/PLATELET - Abnormal; Notable for the following components:   WBC 17.2 (*)    RBC 3.98 (*)    Hemoglobin 11.7 (*)    HCT 37.7 (*)    Neutro Abs 16.2 (*)    Lymphs Abs 0.4 (*)    Abs Immature Granulocytes 0.08 (*)    All other components within normal limits  RESP PANEL BY RT-PCR (FLU A&B, COVID) [id number]  URINALYSIS, ROUTINE W REFLEX MICROSCOPIC   Radiology DG Chest Port 1 View  Result Date: 12/09/2021 CLINICAL DATA:  Weakness.  Frequent falls. EXAM: PORTABLE CHEST 1 VIEW COMPARISON:  08/15/2020 FINDINGS: Left  chest wall ICD noted with leads in the right atrial appendage and right ventricle. Stable cardiomediastinal contours. Bilateral pleural effusions and mild diffuse interstitial edema noted concerning for CHF. There is diminished aeration to both lung bases which may represent areas of atelectasis or airspace consolidation. Right upper lobe airspace opacity is noted which may reflect pneumonia. IMPRESSION: 1. CHF. 2. Right upper lobe airspace opacity concerning for pneumonia. 3. Low lung volumes. Atelectasis versus airspace consolidation in both lung bases. Electronically Signed   By: Kerby Moors M.D.   On: 12/09/2021 07:36    Procedures Procedures  Cardiac monitor shows normal sinus rhythm per my interpretation.  Medications Ordered in ED Medications - No data to display  ED Course/ Medical Decision Making/ A&P                           Medical Decision Making Amount and/or Complexity of Data Reviewed Labs: ordered. Radiology: ordered.   End-stage ALS inpatient he does not wish aggressive measures.  I talked with family and patient about various types of interventions, and they are not interested in any kind of feeding tube.  They mainly want to make sure that he is comfortable.  He is noted to be hypoxic and will check chest x-ray to rule out pneumonia and also check urinalysis to rule out UTI as causes of deterioration.  He will need to have diuretics to help with his pedal edema.  We will check screening labs as well and plan to admit to help coordinate home services including transitions of care consult and palliative care consult.  He would probably be a good candidate for home hospice care.  Old records are reviewed confirming diagnosis of ALS with last visit with neurologist on 09/14/2020, last office visit on record was with cardiology on 08/28/2021 at which time defibrillator function was discontinued.    Chest x-ray appears to show pneumonia.  I have independently viewed the images  and agree with radiologist's interpretation.  He is started on ceftriaxone and azithromycin.  If infection is contributing to his decline, treatment of pneumonia should give him some degree of improvement.  Other labs show mild hypokalemia, mild anemia which is unchanged from baseline.  Mild leukocytosis is nonspecific.  Hospitalist has been consulted for admission.  Case is signed out to Dr. Doren Custard to discuss with hospitalist..  CRITICAL CARE Performed by: Delora Fuel Total critical care time: 35 minutes Critical care time was exclusive of separately billable procedures and treating other patients. Critical care was necessary to treat or prevent imminent or life-threatening deterioration. Critical care was time spent personally by me on the following activities: development of treatment plan with patient  and/or surrogate as well as nursing, discussions with consultants, evaluation of patient's response to treatment, examination of patient, obtaining history from patient or surrogate, ordering and performing treatments and interventions, ordering and review of laboratory studies, ordering and review of radiographic studies, pulse oximetry and re-evaluation of patient's condition.       Final Clinical Impression(s) / ED Diagnoses Final diagnoses:  ALS (amyotrophic lateral sclerosis) ([place])  Hypoxia  Inability to swallow  Community acquired pneumonia, unspecified laterality  Hypokalemia  Normochromic normocytic anemia    Rx / DC Orders ED Discharge Orders     None         Delora Fuel, MD 60/10/93 2355    Delora Fuel, MD 73/22/02 [id number]

## 2021-12-09 NOTE — H&P (Addendum)
History and Physical    [name] YQM:[id number] DOB: 08/31/1934 DOA: 12/09/2021  PCP: Manon Hilding, MD   Patient coming from: home  I have personally briefly reviewed patient's old medical records in [place]  Chief Complaint: Shortness of breath and general malaise.  HPI: [name] is a 88 y.o. male with medical history significant of advanced ALS, nonischemic cardiomyopathy, history of nonobstructive coronary artery disease, history of ventricular tachycardia status post implantable defibrillator, hypothyroidism, BPH and hypertension; presented to the hospital secondary to general malaise, shortness of breath, intermittent coughing spells and lower extremity swelling.  Patient symptoms have been present for the last 3-4 days and worsening.  At this moment no longer able to take his oral meds at home and experiencing significant difficulty swallowing.  Patient denies fever, no chest pain, no dysuria, no hematuria, no focal weakness or sick contacts. Patient has expressed very clear and no heroic or  aggressive interventions.  Expressed to be ready to die if it is time would like to set up hospice at home.  Patient is now vaccinated against COVID; COVID PCR in the ED negative.  ED Course: Work-up demonstrating hypoxia of 87% on room air, chronically ill and severe protein calorie malnutrition in appearance; increase upper respiratory symptoms and gargling, constantly expectorating and using suction device intermittently.  She has a great with positive right lower lobe pneumonia and vascular congestion; very WBCs and mild hypokalemia.  Patient has made sepsis criteria with mild hypothermia, tachypnea, elevated WBCs, hypoxia and findings suggestive of right lower lobe pneumonia on CXR.  Antibiotic has been initiated, given signs of vascular congestion and history of nonischemic cardiomyopathy no fluid resuscitation boluses provided.  TRH has been called to place in the hospital  for further evaluation and management.  Review of Systems: As per HPI otherwise all other systems reviewed and are negative.  Past Medical History:  Diagnosis Date   Automatic implantable cardiac defibrillator in situ    MDT, implanted 2009   CAD (coronary artery disease)    minimal by cath   CHF NYHA class II ([place])    DCM (dilated cardiomyopathy) ([place])     Past Surgical History:  Procedure Laterality Date   CARDIAC CATHETERIZATION  12/17/07   EP IMPLANTABLE DEVICE N/A 10/18/2015   Procedure: ICD Generator Changeout;  Surgeon: Evans Lance, MD;  Location: [facility];  Service: Cardiovascular;  Laterality: N/A;   S/P ICD  2009    Social History  reports that he has never smoked. He has never used smokeless tobacco. He reports that he does not drink alcohol and does not use drugs.  Allergies  Allergen Reactions   Eliquis [Apixaban] Swelling    Facial swelling/redness    Family History  Problem Relation Age of Onset   Tuberculosis Father    Prior to Admission medications   Medication Sig Start Date End Date Taking? Authorizing Provider  amiodarone (PACERONE) 200 MG tablet Take 1 tablet (200 mg total) by mouth daily. 10/16/21   Evans Lance, MD  finasteride (PROSCAR) 5 MG tablet Take 1 tablet by mouth daily. 12/09/17   [provider]  levothyroxine (SYNTHROID, LEVOTHROID) 50 MCG tablet Take 50 mcg by mouth daily.    [provider]  losartan (COZAAR) 25 MG tablet Take 25 mg by mouth daily.    [provider]  metoprolol tartrate (LOPRESSOR) 25 MG tablet Take 25 mg by mouth 2 (two) times daily.  [provider]  Tamsulosin HCl (FLOMAX) 0.4 MG CAPS Take 0.4 mg by mouth daily.    [provider]    Physical Exam: Vitals:   12/09/21 0700 12/09/21 0715 12/09/21 0730 12/09/21 0800  BP: 123/70  119/70 129/72  Pulse: 62 65 (!) 42 (!) 58  Resp: 19 (!) 21 19 (!) 21  SpO2: (!) 83% 93% (!) 79% (!) 87%  Weight:       Height:        Constitutional: Chronically ill in appearance, mild to moderate distress secondary to inability to properly swallow his own secretions and ongoing productive coughing spells and shortness of breath.  4-5 L nasal cannula supplementation in place.  Severely underweight. Vitals:   12/09/21 0700 12/09/21 0715 12/09/21 0730 12/09/21 0800  BP: 123/70  119/70 129/72  Pulse: 62 65 (!) 42 (!) 58  Resp: 19 (!) 21 19 (!) 21  SpO2: (!) 83% 93% (!) 79% (!) 87%  Weight:      Height:       Eyes: PERRL, lids and conjunctivae normal, no icterus, no nystagmus. ENMT: Mucous membranes are dry on examination. Posterior pharynx clear of any exudate or lesions. Neck: normal, supple, no masses, no thyromegaly, no JVD Respiratory: Positive tachypnea, diffuse bilateral rhonchi (right more than left), no frank crackles, no wheezing. Cardiovascular: Regular rate, no rubs, no gallops, positive AICD device appreciated on his left chest. Abdomen: Mild deep tenderness reported in his mid abdomen, no masses, positive bowel sounds, no guarding, soft abdomen on exam. Musculoskeletal: 2+ pedal edema appreciated bilaterally; no cyanosis or clubbing. Skin: no petechiae, no open wounds appreciated. Neurologic: No new deficits; speech is abnormal in the setting of advanced ALS. Psychiatric: Despite inability to talk patient demonstrated normal judgment, oriented x3.  He is able to communicate by writing.  Labs on Admission: I have personally reviewed following labs and imaging studies  CBC: Recent Labs  Lab 12/09/21 0707  WBC 17.2*  NEUTROABS 16.2*  HGB 11.7*  HCT 37.7*  MCV 94.7  PLT 482    Basic Metabolic Panel: Recent Labs  Lab 12/09/21 0707  NA 139  K 3.3*  CL 97*  CO2 34*  GLUCOSE 148*  BUN 22  CREATININE 0.84  CALCIUM 8.5*    GFR: Estimated Creatinine Clearance: 57.3 mL/min (by C-G formula based on SCr of 0.84 mg/dL).  Liver Function Tests: Recent Labs  Lab 12/09/21 0707   AST 21  ALT 28  ALKPHOS 65  BILITOT 1.2  PROT 5.9*  ALBUMIN 2.6*    Urine analysis: No results found for: COLORURINE, APPEARANCEUR, LABSPEC, PHURINE, GLUCOSEU, HGBUR, BILIRUBINUR, KETONESUR, PROTEINUR, UROBILINOGEN, NITRITE, LEUKOCYTESUR  Radiological Exams on Admission: DG Chest Port 1 View  Result Date: 12/09/2021 CLINICAL DATA:  Weakness.  Frequent falls. EXAM: PORTABLE CHEST 1 VIEW COMPARISON:  08/15/2020 FINDINGS: Left chest wall ICD noted with leads in the right atrial appendage and right ventricle. Stable cardiomediastinal contours. Bilateral pleural effusions and mild diffuse interstitial edema noted concerning for CHF. There is diminished aeration to both lung bases which may represent areas of atelectasis or airspace consolidation. Right upper lobe airspace opacity is noted which may reflect pneumonia. IMPRESSION: 1. CHF. 2. Right upper lobe airspace opacity concerning for pneumonia. 3. Low lung volumes. Atelectasis versus airspace consolidation in both lung bases. Electronically Signed   By: Kerby Moors M.D.   On: 12/09/2021 07:36    EKG: none   Assessment/Plan 1-sepsis secondary to pneumonia -High risk aspiration due to advanced ALS. -  Continue treatment with Unasyn -Follow culture results -Nebulizer management, and oxygen supplementation provided. -Overall prognosis very poor. -Palliative care has been consulted.  2-acute respiratory failure with hypoxia -In the setting of prob #1 -Providing oxygen supplementation and antibiotics treament as mentioned above.  3-nonischemic cardiomyopathy/ventricular tachycardia -Rate controlled -AICD recently disconnected -Patient has not been able to take medications at home and currently demonstrating significant difficulty swallowing. -Follow vital signs and use low-dose Lopressor through his veins. -Follow clinical response. -Follow daily weights and strict intake and output -Intravascularly appears to be dry; gentle fluid  resuscitation will be provided overnight. -TED hoses applied. -Patient was not receiving any diuretic therapy prior to admission.  4-increased upper airway secretions -Scopolamine patch has been started.  5-failure to thrive/severe protein calorie malnutrition -In the setting of advanced ALS and inability to properly eat or drink. -Overall poor prognosis -Palliative care has been consulted -Patient no wanting aerobic measures, invasive treatment or artificial feeding. -Will benefit of home hospice.  6-DNR/DNI -High risk for decompensation due to underlying progressive and terminal disease. -CODE STATUS has been confirmed with patient and family members at bedside -Continue current treatment to a stabilized condition and determine best discharge pathway. (Family interested in home with hospice). -Palliative care has been consulted for assistance with advance care planning and symptomatic management.  7-hypokalemia -mild, K3.3 -in the setting of decrease oral intake -will replete and follow electrolytes trend -checking Mg.  DVT prophylaxis: Lovenox Code Status:   DNR/DNI Family Communication:  Sister at bedside Disposition Plan:   Patient is from:  Home  Anticipated DC to:  Most likely home with hospice; but will be determined based on his clinical response.  Anticipated DC date:  12/11/21  Anticipated DC barriers: Stabilization of respiratory status and acute infection.  Consults called:  Palliative care Admission status:  Inpatient, Med-surg, LOS > 2 midnight  Severity of Illness: The appropriate patient status for this patient is INPATIENT. Inpatient status is judged to be reasonable and necessary in order to provide the required intensity of service to ensure the patient's safety. The patient's presenting symptoms, physical exam findings, and initial radiographic and laboratory data in the context of their chronic comorbidities is felt to place them at high risk for further  clinical deterioration. Furthermore, it is not anticipated that the patient will be medically stable for discharge from the hospital within 2 midnights of admission.   * I certify that at the point of admission it is my clinical judgment that the patient will require inpatient hospital care spanning beyond 2 midnights from the point of admission due to high intensity of service, high risk for further deterioration and high frequency of surveillance required.Barton Dubois MD [facility]  How to contact the [facility] Attending or Consulting provider [place] or covering provider during after hours [place], for this patient?   Check the care team in [facility] and look for a) attending/consulting TR[facility] provider listed and b) the [facility] team listed Log into [email or web address] and use [place]'s universal password to access. If you do not have the password, please contact the hospital operator. Locate the [facility] provider you are looking for under [facility] and page to a number that you can be directly reached. If you still have difficulty reaching the provider, please page the [facility] (Director on Call) for the Hospitalists listed on amion for assistance.  12/09/2021, 8:17 AM

## 2021-12-09 NOTE — ED Provider Notes (Signed)
Care of patient assumed from Dr. Roxanne Mins at 8 AM.  This patient has advanced ALS.  This is because difficulty talking and swallowing.  He does present from home with family.  He was hypoxic upon arrival he was started on supplemental oxygen.  X-ray imaging is concerning for CHF and pneumonia.  Patient started on antibiotics.  He is to be admitted to the hospital.  Patient is clear that he does not want resuscitation or aggressive interventions. Physical Exam  BP 129/72    Pulse (!) 58    Resp (!) 21    Ht 6\' 3"  (1.905 m)    Wt 63 kg    SpO2 (!) 87%    BMI 17.36 kg/m   Physical Exam Constitutional:      General: He is not in acute distress.    Appearance: He is ill-appearing. He is not toxic-appearing.  HENT:     Right Ear: External ear normal.     Left Ear: External ear normal.     Nose: Nose normal.  Pulmonary:     Effort: No respiratory distress.     Breath sounds: Rhonchi and rales present.  Musculoskeletal:     Right lower leg: Edema present.     Left lower leg: Edema present.  Skin:    General: Skin is warm and dry.    Procedures  Procedures  ED Course / MDM    Medical Decision Making Amount and/or Complexity of Data Reviewed Labs: ordered. Radiology: ordered.  Risk Decision regarding hospitalization.   Chest x-ray shows findings of CHF and pneumonia.  Antibiotics were started.  Patient was admitted for further management.       Godfrey Pick, MD 12/09/21 [phone]

## 2021-12-09 NOTE — ED Notes (Signed)
Does not have palliative care or hospice at home. He has not been to dr in several months.

## 2021-12-09 NOTE — ED Triage Notes (Addendum)
POV from home with family. Multiple complaints. Pt was diagnosed with ALS around a year ago and at the time was only given 6 months to live. Pt can hardly talk at baseline with difficulty swallowing. Frequent falls. CHF and pacemaker/defibrillator. Family states that some of the pacemaker has been turned off. The Defibrilator is off but the pacer remains on. He wants it turned off completely.    Pt is supposed to be on oxygen at home. 70% here. 88% on 4l.   Also here for extremely swollen feet  And saying he wants to die.  Pt has DNR forms at home. Does not CPR or intubation.

## 2021-12-10 ENCOUNTER — Encounter (HOSPITAL_COMMUNITY): Payer: Self-pay | Admitting: Internal Medicine

## 2021-12-10 DIAGNOSIS — E876 Hypokalemia: Secondary | ICD-10-CM | POA: Diagnosis not present

## 2021-12-10 DIAGNOSIS — A419 Sepsis, unspecified organism: Principal | ICD-10-CM

## 2021-12-10 DIAGNOSIS — J690 Pneumonitis due to inhalation of food and vomit: Secondary | ICD-10-CM | POA: Diagnosis not present

## 2021-12-10 DIAGNOSIS — Z7189 Other specified counseling: Secondary | ICD-10-CM

## 2021-12-10 DIAGNOSIS — Z515 Encounter for palliative care: Secondary | ICD-10-CM

## 2021-12-10 DIAGNOSIS — G1221 Amyotrophic lateral sclerosis: Secondary | ICD-10-CM | POA: Diagnosis not present

## 2021-12-10 DIAGNOSIS — J9601 Acute respiratory failure with hypoxia: Secondary | ICD-10-CM | POA: Diagnosis not present

## 2021-12-10 LAB — CBC
HCT: 36.4 % — ABNORMAL LOW (ref 39.0–52.0)
Hemoglobin: 10.8 g/dL — ABNORMAL LOW (ref 13.0–17.0)
MCH: 28.5 pg (ref 26.0–34.0)
MCHC: 29.7 g/dL — ABNORMAL LOW (ref 30.0–36.0)
MCV: 96 fL (ref 80.0–100.0)
Platelets: 355 10*3/uL (ref 150–400)
RBC: 3.79 MIL/uL — ABNORMAL LOW (ref 4.22–5.81)
RDW: 13.7 % (ref 11.5–15.5)
WBC: 9.8 10*3/uL (ref 4.0–10.5)
nRBC: 0 % (ref 0.0–0.2)

## 2021-12-10 LAB — BASIC METABOLIC PANEL
Anion gap: 7 (ref 5–15)
BUN: 23 mg/dL (ref 8–23)
CO2: 35 mmol/L — ABNORMAL HIGH (ref 22–32)
Calcium: 8.6 mg/dL — ABNORMAL LOW (ref 8.9–10.3)
Chloride: 100 mmol/L (ref 98–111)
Creatinine, Ser: 0.82 mg/dL (ref 0.61–1.24)
GFR, Estimated: >60 mL/min (ref >60)
Glucose, Bld: 89 mg/dL (ref 70–99)
Potassium: 3.3 mmol/L — ABNORMAL LOW (ref 3.5–5.1)
Sodium: 142 mmol/L (ref 135–145)

## 2021-12-10 MED ORDER — POTASSIUM CHLORIDE 10 MEQ/100ML IV SOLN
10.0000 meq | INTRAVENOUS | Status: AC
  Administered 2021-12-10 (×4): 10 meq via INTRAVENOUS
  Filled 2021-12-10 (×4): qty 100

## 2021-12-10 NOTE — Evaluation (Signed)
Clinical/Bedside Swallow Evaluation Patient Details  Name: [name] MRN: [id number] Date of Birth: 1934-08-14  Today's Date: 12/10/2021 Time: SLP Start Time (ACUTE ONLY): 11 SLP Stop Time (ACUTE ONLY): 1430 SLP Time Calculation (min) (ACUTE ONLY): 25 min  Past Medical History:  Past Medical History:  Diagnosis Date   Automatic implantable cardiac defibrillator in situ    MDT, implanted 2009   CAD (coronary artery disease)    minimal by cath   CHF NYHA class II ([place])    DCM (dilated cardiomyopathy) ([place])    Past Surgical History:  Past Surgical History:  Procedure Laterality Date   CARDIAC CATHETERIZATION  12/17/07   EP IMPLANTABLE DEVICE N/A 10/18/2015   Procedure: ICD Generator Changeout;  Surgeon: Evans Lance, MD;  Location: [place] [facility];  Service: Cardiovascular;  Laterality: N/A;   S/P ICD  2009   HPI:  [name] is a 88 y.o. male with medical history significant of advanced ALS, nonischemic cardiomyopathy, history of nonobstructive coronary artery disease, history of ventricular tachycardia status post implantable defibrillator, hypothyroidism, BPH and hypertension; presented to the hospital secondary to general malaise, shortness of breath, intermittent coughing spells and lower extremity swelling.  Patient symptoms have been present for the last 3-4 days and worsening.  At this moment no longer able to take his oral meds at home and experiencing significant difficulty swallowing.  Patient denies fever, no chest pain, no dysuria, no hematuria, no focal weakness or sick contacts.  Patient has expressed very clear and no heroic or  aggressive interventions.  Expressed to be ready to die if it is time would like to set up hospice at home.BSE requested. Pt had MBSS 05/25/20.    Assessment / Plan / Recommendation  Clinical Impression  Pt presents with severe oropharyngeal dysphagia characterized by impaired mandibular, facial, and lingual function resulting in  reduced ability to close mouth and move tongue (bulbar ALS) with difficulty moving bolus in oral cavity. Pt primarily communicates by gestures and some writing. His family reports that he does not want a feeding tube or other heroic measures and that he consumes pureed textures and thin water from a tiny hole punched through a plastic water bottle. Pt acknowledges risks of aspiration and choking and points to food and water and to his mouth. Pt self presented 4 oz applesauce with delayed swallows (difficult to determine how much actually swallowed and where it is going) and oral residuals followed by delayed coughing and wet vocal quality. SLP supplied tiny amounts of water via occluded straw placed near later posterior oral cavity. Pt again expressed via head nods and pointing after SLP and family discussion, that he wants to continue eating by mouth. Family is interested in hospice at home and he would benefit from a hospital bed and oral suction set up at home. SLP discussed with MD who is in agreement for "comfort feeds" of puree and thin, with an understanding that Pt is at high and likely risk for aspiration and malnutrition. Palliative and hospice has been consulted. Will defer diet order to MD (recommend D1/puree and thin liquids) with 100% supervision with meals and suction set up at bedside.  SLP Visit Diagnosis: Dysphagia, unspecified (R13.10)    Aspiration Risk  Risk for inadequate nutrition/hydration;Severe aspiration risk    Diet Recommendation Dysphagia 1 (Puree);Thin liquid ("comfort care")   Liquid Administration via: Other (Comment) (occluded straw, Pt has a water bottle he uses) Medication Administration: Via alternative means Supervision: Patient able to  self feed;Full supervision/cueing for compensatory strategies Compensations: Slow rate;Small sips/bites Postural Changes: Seated upright at 90 degrees;Remain upright for at least 30 minutes after po intake    Other  Recommendations  Oral Care Recommendations: Oral care before and after PO;Staff/trained caregiver to provide oral care Other Recommendations: Clarify dietary restrictions    Recommendations for follow up therapy are one component of a multi-disciplinary discharge planning process, led by the attending physician.  Recommendations may be updated based on patient status, additional functional criteria and insurance authorization.  Follow up Recommendations No SLP follow up      Assistance Recommended at Discharge Frequent or constant Supervision/Assistance  Functional Status Assessment Patient has had a recent decline in their functional status and/or demonstrates limited ability to make significant improvements in function in a reasonable and predictable amount of time  Frequency and Duration            Prognosis Prognosis for Safe Diet Advancement: Guarded Barriers to Reach Goals: Severity of deficits      Swallow Study   General Date of Onset: 12/09/21 HPI: [name] is a 88 y.o. male with medical history significant of advanced ALS, nonischemic cardiomyopathy, history of nonobstructive coronary artery disease, history of ventricular tachycardia status post implantable defibrillator, hypothyroidism, BPH and hypertension; presented to the hospital secondary to general malaise, shortness of breath, intermittent coughing spells and lower extremity swelling.  Patient symptoms have been present for the last 3-4 days and worsening.  At this moment no longer able to take his oral meds at home and experiencing significant difficulty swallowing.  Patient denies fever, no chest pain, no dysuria, no hematuria, no focal weakness or sick contacts.  Patient has expressed very clear and no heroic or  aggressive interventions.  Expressed to be ready to die if it is time would like to set up hospice at home.BSE requested. Pt had MBSS 05/25/20. Type of Study: Bedside Swallow Evaluation Previous Swallow Assessment: MBSS  05/25/2020 aspiration, but reg/thin Diet Prior to this Study: NPO Temperature Spikes Noted: No Respiratory Status: Nasal cannula History of Recent Intubation: No Behavior/Cognition: Alert;Cooperative;Pleasant mood Oral Cavity Assessment: Dry (mouth open posture) Oral Care Completed by SLP: Yes Oral Cavity - Dentition: Dentures, top Vision: Functional for self-feeding Self-Feeding Abilities: Able to feed self Patient Positioning: Upright in bed Baseline Vocal Quality: Not observed Volitional Cough: Strong Volitional Swallow: Unable to elicit    Oral/Motor/Sensory Function Overall Oral Motor/Sensory Function: Severe impairment Facial ROM: Reduced right;Reduced left;Suspected CN VII (facial) dysfunction Facial Strength: Reduced right;Reduced left Lingual ROM: Reduced right;Reduced left;Suspected CN XII (hypoglossal) dysfunction Lingual Symmetry: Within Functional Limits Lingual Strength: Reduced;Suspected CN XII (hypoglossal) dysfunction Lingual Sensation: Reduced Mandible: Impaired   Ice Chips Ice chips: Not tested   Thin Liquid Thin Liquid: Impaired Presentation: Straw (SLP cccluded end of straw) Oral Phase Impairments: Reduced labial seal;Reduced lingual movement/coordination Oral Phase Functional Implications: Prolonged oral transit Pharyngeal  Phase Impairments: Suspected delayed Swallow;Decreased hyoid-laryngeal movement;Wet Vocal Quality    Nectar Thick Nectar Thick Liquid: Not tested   Honey Thick Honey Thick Liquid: Not tested   Puree Puree: Impaired Presentation: Spoon;Self Fed Oral Phase Impairments: Reduced labial seal;Reduced lingual movement/coordination Oral Phase Functional Implications: Prolonged oral transit;Oral residue Pharyngeal Phase Impairments: Suspected delayed Swallow;Decreased hyoid-laryngeal movement;Multiple swallows;Throat Clearing - Delayed;Cough - Delayed   Solid     Solid: Not tested     Thank you,  Genene Churn,  [place]  Raad Clayson 12/10/2021,3:13 PM

## 2021-12-10 NOTE — Progress Notes (Signed)
Family at bedside helping patient eat his pureed food the way he does at home which is to mix food in a large cup. Has yaunkauer in reach for self use.  Antibiotic running and potassium runs to follow

## 2021-12-10 NOTE — Progress Notes (Signed)
PROGRESS NOTE    [name]  RSW:[id number] DOB: [birth year] DOA: 12/09/2021 PCP: Manon Hilding, MD   Chief complaint: Shortness of breath and failure to thrive   Brief Narrative:  [name] is a 88 y.o. male with medical history significant of advanced ALS, nonischemic cardiomyopathy, history of nonobstructive coronary artery disease, history of ventricular tachycardia status post implantable defibrillator, hypothyroidism, BPH and hypertension; presented to the hospital secondary to general malaise, shortness of breath, intermittent coughing spells and lower extremity swelling.  Patient symptoms have been present for the last 3-4 days and worsening.  At this moment no longer able to take his oral meds at home and experiencing significant difficulty swallowing.  Patient denies fever, no chest pain, no dysuria, no hematuria, no focal weakness or sick contacts. Patient has expressed very clear and no heroic or  aggressive interventions.  Expressed to be ready to die if it is time would like to set up hospice at home.   Patient is now vaccinated against COVID; COVID PCR in the ED negative.   ED Course: Work-up demonstrating hypoxia of 87% on room air, chronically ill and severe protein calorie malnutrition in appearance; increase upper respiratory symptoms and gargling, constantly expectorating and using suction device intermittently.  She has a great with positive right lower lobe pneumonia and vascular congestion; very WBCs and mild hypokalemia.   Assessment & Plan: 1-sepsis secondary to pneumonia -Patient had made criteria for sepsis at time of admission with elevated WBCs, tachypnea, hypoxia and positive source of infection found on x-rays (right lower lobe pneumonia). -Appears to be old secondary to aspiration given advanced ALS. -For now continue antibiotic -Patient has responded and feeling better; after discussing with family and patient plan is to start dysphagia 1 diet with  thin liquids understanding ongoing high risk of aspiration. -We will transition to Augmentin at time of discharge to finish acute treatment  -will arrange for home hospice -DME equipment requested includes suction device, oxygen supplementation on hospital bed.  2-acute respiratory failure with hypoxia -In the setting of problem #1 -Oxygen supplementation will be provided.  3-nonischemic cardiomyopathy/ventricular tachycardia -Resume oral medications as long as he is able to take them. -Continue the use of TED hoses -Significant improvement appreciated in his lower extremity swelling with application of TED hoses, suggesting third space shifting and being assessed disease and not vascular congestion. -Patient intravascularly dry on examination. -Plan is for hospice at home.  4-increased upper airway secretions -Continue the use of a scopolamine patch.  5-failure to thrive/severe protein calorie malnutrition -Per family reports patient has lost over 50 pounds. -Cachectic and significantly underweight on examination. -Body mass index is 17.36 kg/m. -Patient had expressed very clear no heroic measures, invasive treatment no artificial feeding.  6-DNR/DNI -CODE STATUS has been reviewed and confirmed with patient and family member -Will arrange for home hospice. -Prognosis is very poor.  7-hypokalemia -In the setting of poor oral intake -Will provide repletion and follow trend.   DVT prophylaxis: Lovenox Code Status: DNR/DNI. Family Communication: No family at bedside. Disposition:   Status is: Inpatient; continue antibiotic treatment, advancing diet as acute and DME's for safe discharge home with home hospice.    Consultants:  Palliative care  Procedures:  See below for x-ray reports  Antimicrobials:  Unasyn   Subjective: Afebrile, no chest pain, no nausea or vomiting.  Expressing to be hungry.  Feels better overall and would like to have diet advance.  2 L nasal  cannula in place.  Objective: Vitals:   12/09/21 2300 12/10/21 0548 12/10/21 0806 12/10/21 1248  BP: (!) 144/84 (!) 146/78  (!) 145/80  Pulse: 65 60  63  Resp: 18 18  20   Temp: (!) 97.5 F (36.4 C) 97.6 F (36.4 C)  97.6 F (36.4 C)  TempSrc: Oral Oral  Axillary  SpO2: 100% 100% 98% 100%  Weight:      Height:        Intake/Output Summary (Last 24 hours) at 12/10/2021 1543 Last data filed at 12/10/2021 1500 Gross per 24 hour  Intake 200 ml  Output --  Net 200 ml   Filed Weights   12/09/21 0654  Weight: 63 kg    Examination:  General exam: Appears calm or and expressed feeling better.  Patient would like to have something to eat or drink despite high risk chances for aspiration.  No fever Respiratory system: Positive bilateral rhonchi, mild expiratory wheezing.  No crackles.  2 L nasal cannula supplementation in place. Cardiovascular system: Rate controlled, no rubs, no gallops, no JVD. Gastrointestinal system: Abdomen is nondistended, soft and nontender. No organomegaly or masses felt. Normal bowel sounds heard. Central nervous system: No new focal deficits. Extremities: No cyanosis or clubbing; TED hoses in place, trace to 1+ edema appreciated on both of his legs. Skin: No petechiae. Psychiatry: Judgement and insight appear normal. Mood & affect appropriate.     Data Reviewed: I have personally reviewed following labs and imaging studies  CBC: Recent Labs  Lab 12/09/21 0707 12/10/21 0440  WBC 17.2* 9.8  NEUTROABS 16.2*  --   HGB 11.7* 10.8*  HCT 37.7* 36.4*  MCV 94.7 96.0  PLT 322 161    Basic Metabolic Panel: Recent Labs  Lab 12/09/21 0707 12/10/21 0440  NA 139 142  K 3.3* 3.3*  CL 97* 100  CO2 34* 35*  GLUCOSE 148* 89  BUN 22 23  CREATININE 0.84 0.82  CALCIUM 8.5* 8.6*  MG 1.8  --   PHOS 3.2  --     GFR: Estimated Creatinine Clearance: 58.7 mL/min (by C-G formula based on SCr of 0.82 mg/dL).  Liver Function Tests: Recent Labs  Lab  12/09/21 0707  AST 21  ALT 28  ALKPHOS 65  BILITOT 1.2  PROT 5.9*  ALBUMIN 2.6*    CBG: No results for input(s): GLUCAP in the last 168 hours.   Recent Results (from the past 240 hour(s))  Resp Panel by RT-PCR (Flu A&B, Covid) Nasopharyngeal Swab     Status: None   Collection Time: 12/09/21  7:07 AM   Specimen: Nasopharyngeal Swab; Nasopharyngeal(NP) swabs in vial transport medium  Result Value Ref Range Status   SARS Coronavirus 2 by RT PCR NEGATIVE NEGATIVE Final    Comment: (NOTE) SARS-CoV-2 target nucleic acids are NOT DETECTED.  The SARS-CoV-2 RNA is generally detectable in upper respiratory specimens during the acute phase of infection. The lowest concentration of SARS-CoV-2 viral copies this assay can detect is 138 copies/mL. A negative result does not preclude SARS-Cov-2 infection and should not be used as the sole basis for treatment or other patient management decisions. A negative result may occur with  improper specimen collection/handling, submission of specimen other than nasopharyngeal swab, presence of viral mutation(s) within the areas targeted by this assay, and inadequate number of viral copies(<138 copies/mL). A negative result must be combined with clinical observations, patient history, and epidemiological information. The expected result is Negative.  Fact Sheet for Patients:  EntrepreneurPulse.com.au  Fact Sheet for Healthcare  Providers:  IncredibleEmployment.be  This test is no t yet approved or cleared by the [place] and  has been authorized for detection and/or diagnosis of SARS-CoV-2 by FDA under an Emergency Use Authorization (EUA). This EUA will remain  in effect (meaning this test can be used) for the duration of the COVID-19 declaration under Section 564(b)(1) of the Act, 21 U.S.C.section 360bbb-3(b)(1), unless the authorization is terminated  or revoked sooner.       Influenza A by PCR  NEGATIVE NEGATIVE Final   Influenza B by PCR NEGATIVE NEGATIVE Final    Comment: (NOTE) The Xpert Xpress SARS-CoV-2/FLU/RSV plus assay is intended as an aid in the diagnosis of influenza from Nasopharyngeal swab specimens and should not be used as a sole basis for treatment. Nasal washings and aspirates are unacceptable for Xpert Xpress SARS-CoV-2/FLU/RSV testing.  Fact Sheet for Patients: EntrepreneurPulse.com.au  Fact Sheet for Healthcare Providers: IncredibleEmployment.be  This test is not yet approved or cleared by the [place] FDA and has been authorized for detection and/or diagnosis of SARS-CoV-2 by FDA under an Emergency Use Authorization (EUA). This EUA will remain in effect (meaning this test can be used) for the duration of the COVID-19 declaration under Section 564(b)(1) of the Act, 21 U.S.C. section 360bbb-3(b)(1), unless the authorization is terminated or revoked.  Performed at [facility], [address], [place], [place] [address]   Culture, blood (routine x 2) Call MD if unable to obtain prior to antibiotics being given     Status: None (Preliminary result)   Collection Time: 12/09/21  8:33 AM   Specimen: Right Antecubital; Blood  Result Value Ref Range Status   Specimen Description   Final    RIGHT ANTECUBITAL BOTTLES DRAWN AEROBIC AND ANAEROBIC   Special Requests   Final    Blood Culture results may not be optimal due to an excessive volume of blood received in culture bottles   Culture   Final    NO GROWTH 1 DAY Performed at [facility], [address] [place], [place] [address]    Report Status PENDING  Incomplete  Culture, blood (routine x 2) Call MD if unable to obtain prior to antibiotics being given     Status: None (Preliminary result)   Collection Time: 12/09/21  8:42 AM   Specimen: BLOOD RIGHT ARM  Result Value Ref Range Status   Specimen Description   Final    BLOOD RIGHT ARM BOTTLES DRAWN AEROBIC AND  ANAEROBIC   Special Requests Blood Culture adequate volume  Final   Culture   Final    NO GROWTH 1 DAY Performed at [facility], [address] [place], [place] [address]    Report Status PENDING  Incomplete     Radiology Studies: DG Chest Port 1 View  Result Date: 12/09/2021 CLINICAL DATA:  Weakness.  Frequent falls. EXAM: PORTABLE CHEST 1 VIEW COMPARISON:  08/15/2020 FINDINGS: Left chest wall ICD noted with leads in the right atrial appendage and right ventricle. Stable cardiomediastinal contours. Bilateral pleural effusions and mild diffuse interstitial edema noted concerning for CHF. There is diminished aeration to both lung bases which may represent areas of atelectasis or airspace consolidation. Right upper lobe airspace opacity is noted which may reflect pneumonia. IMPRESSION: 1. CHF. 2. Right upper lobe airspace opacity concerning for pneumonia. 3. Low lung volumes. Atelectasis versus airspace consolidation in both lung bases. Electronically Signed   By: Kerby Moors M.D.   On: 12/09/2021 07:36     Scheduled Meds:  budesonide (PULMICORT) nebulizer solution  0.5 mg Nebulization BID   enoxaparin (LOVENOX) injection  40 mg Subcutaneous Q24H   metoprolol tartrate  2.5 mg Intravenous Q12H   scopolamine  1 patch Transdermal Q72H   Continuous Infusions:  ampicillin-sulbactam (UNASYN) IV 3 g (12/10/21 0956)     LOS: 1 day     Barton Dubois, MD [facility]   To contact the attending provider between 7A-7P or the covering provider during after hours 7P-7A, please log into the web site [email or web address] and access using universal [place] password for that web site. If you do not have the password, please call the hospital operator.  12/10/2021, 3:43 PM

## 2021-12-10 NOTE — Consult Note (Signed)
Consultation Note Date: 12/10/2021   Patient Name: [name]  DOB: 1934/07/02  MRN: [id number]  Age / Sex: 88 y.o., male  PCP: Manon Hilding, MD Referring Physician: Barton Dubois, MD  Reason for Consultation: Establishing goals of care  HPI/Patient Profile: 88 y.o. male  with past medical history of advanced ALS, nonischemic cardiomyopathy, history of nonobstructive CAD, history of V. tach status post implantable defibrillator, hypothyroidism, BPH, HTN admitted on 12/09/2021 with sepsis secondary to pneumonia, high risk for aspiration due to advanced ALS.   Clinical Assessment and Goals of Care: I have reviewed medical records including EPIC notes, labs and imaging, received report from RN, assessed the patient.  [name], [name], is sitting up in the bed.  He appears acutely/chronically ill and quite frail.  He is nonverbal, but is able to make his needs known through writing on a white board.  He is alert, and is asking appropriate questions and responding appropriately.  We meet at bedside to discuss diagnosis prognosis, GOC, EOL wishes, disposition and options.  I introduced Palliative Medicine as specialized medical care for people living with serious illness. It focuses on providing relief from the symptoms and stress of a serious illness. The goal is to improve quality of life for both the patient and the family.  We  focused on their current illness.  Broadus tells me that he would like something to eat and drink.  We talked about speech therapy consult, awaiting their recommendations.  I shared that they should arrive this afternoon after lunch.  He accepts this.  We talked about his risk for aspiration.  The natural disease trajectory and expectations at EOL were discussed.  Advanced directives, concepts specific to code status, artifical feeding and hydration, and rehospitalization were not  discussed today as he is DNR.  Conference with attending, bedside nursing staff, transition of care team who shares that patient and family are requesting returning home with the benefits of hospice of [facility].  Transition of care is working on DME needs.  It is anticipated that [name] will discharge home tomorrow.    HC POA NEXT OF KIN -[name] tells me that his brothers, Collins [place] and Ronalee Belts, would make choices if he were unable.    SUMMARY OF RECOMMENDATIONS   Discharging home with the benefits of hospice of [place] Planning: DNR  Symptom Management:  Per hospitalist, no additional needs at this time.  Palliative Prophylaxis:  Frequent Pain Assessment, Oral Care, and Palliative Wound Care  Additional Recommendations (Limitations, Scope, Preferences): Home with the benefits of hospice  Psycho-social/Spiritual:  Desire for further Chaplaincy support:no Additional Recommendations: Caregiving  Support/Resources and Education on Hospice  Prognosis:  < 3 months, would not be surprising based on aspiration risk, advanced ALS, patient's desire to continue to eat regular food.  Discharge Planning:  Home with the benefits of hospice care       Primary Diagnoses: Present on Admission:  Acute respiratory failure with hypoxia ([place])   I have  reviewed the medical record, interviewed the patient and family, and examined the patient. The following aspects are pertinent.  Past Medical History:  Diagnosis Date   Automatic implantable cardiac defibrillator in situ    MDT, implanted 2009   CAD (coronary artery disease)    minimal by cath   CHF NYHA class II ([place])    DCM (dilated cardiomyopathy) ([place])    Social History   Socioeconomic History   Marital status: Married    Spouse name: Not on file   Number of children: Not on file   Years of education: Not on file   Highest education level: Not on file  Occupational History   Not on  file  Tobacco Use   Smoking status: Never   Smokeless tobacco: Never  Vaping Use   Vaping Use: Never used  Substance and Sexual Activity   Alcohol use: No   Drug use: Never   Sexual activity: Not on file  Other Topics Concern   Not on file  Social History Narrative   Retired.    Social Determinants of Health   Financial Resource Strain: Not on file  Food Insecurity: Not on file  Transportation Needs: Not on file  Physical Activity: Not on file  Stress: Not on file  Social Connections: Not on file   Family History  Problem Relation Age of Onset   Tuberculosis Father    Scheduled Meds:  budesonide (PULMICORT) nebulizer solution  0.5 mg Nebulization BID   enoxaparin (LOVENOX) injection  40 mg Subcutaneous Q24H   metoprolol tartrate  2.5 mg Intravenous Q12H   scopolamine  1 patch Transdermal Q72H   Continuous Infusions:  ampicillin-sulbactam (UNASYN) IV 3 g (12/10/21 0956)   potassium chloride     PRN Meds:.acetaminophen **OR** acetaminophen, ipratropium-albuterol, ondansetron **OR** ondansetron (ZOFRAN) IV Medications Prior to Admission:  Prior to Admission medications   Medication Sig Start Date End Date Taking? Authorizing Provider  amiodarone (PACERONE) 200 MG tablet Take 1 tablet (200 mg total) by mouth daily. 10/16/21  Yes Evans Lance, MD  aspirin EC 81 MG tablet Take 81 mg by mouth daily. Swallow whole.   Yes [provider]  finasteride (PROSCAR) 5 MG tablet Take 1 tablet by mouth daily. 12/09/17  Yes [provider]  levothyroxine (SYNTHROID, LEVOTHROID) 50 MCG tablet Take 50 mcg by mouth daily.   Yes [provider]  losartan (COZAAR) 25 MG tablet Take 50 mg by mouth daily.   Yes [provider]  metoprolol tartrate (LOPRESSOR) 25 MG tablet Take 25 mg by mouth 2 (two) times daily.    Yes [provider]  Tamsulosin HCl (FLOMAX) 0.4 MG CAPS Take 0.4 mg by mouth daily.   Yes [provider]   Allergies   Allergen Reactions   Eliquis [Apixaban] Swelling    Facial swelling/redness   Review of Systems  Unable to perform ROS: Other   Physical Exam Vitals and nursing note reviewed.  Constitutional:      General: He is not in acute distress.    Appearance: He is ill-appearing.  HENT:     Mouth/Throat:     Mouth: Mucous membranes are moist.  Cardiovascular:     Rate and Rhythm: Normal rate.  Pulmonary:     Effort: Pulmonary effort is normal.  Skin:    General: Skin is warm and dry.  Neurological:     Mental Status: He is alert and oriented to person, place, and time.  Psychiatric:  Mood and Affect: Mood normal.        Behavior: Behavior normal.    Vital Signs: BP (!) 145/80 (BP Location: Left Arm)    Pulse 63    Temp 97.6 F (36.4 C) (Axillary)    Resp 20    Ht 6\' 3"  (1.905 m)    Wt 63 kg    SpO2 100%    BMI 17.36 kg/m  Pain Scale: 0-10   Pain Score: 0-No pain   SpO2: SpO2: 100 % O2 Device:SpO2: 100 % O2 Flow Rate: .O2 Flow Rate (L/min): 4 L/min  IO: Intake/output summary:  Intake/Output Summary (Last 24 hours) at 12/10/2021 1552 Last data filed at 12/10/2021 1500 Gross per 24 hour  Intake 200 ml  Output --  Net 200 ml    LBM:   Baseline Weight: Weight: 63 kg Most recent weight: Weight: 63 kg     Palliative Assessment/Data:   Flowsheet Rows    Flowsheet Row Most Recent Value  Intake Tab   Referral Department Hospitalist  Unit at Time of Referral Med/Surg Unit  Palliative Care Primary Diagnosis Sepsis/Infectious Disease  Date Notified 12/09/21  Palliative Care Type New Palliative care  Reason for referral Clarify Goals of Care  Date of Admission 12/09/21  Date first seen by Palliative Care 12/10/21  # of days Palliative referral response time 1 Day(s)  # of days IP prior to Palliative referral 0  Clinical Assessment   Palliative Performance Scale Score 40%  Pain Max last 24 hours Not able to report  Pain Min Last 24 hours Not able to report   Dyspnea Max Last 24 Hours Not able to report  Dyspnea Min Last 24 hours Not able to report  Psychosocial & Spiritual Assessment   Palliative Care Outcomes        Time In: 1500 Time Out: 1555 Time Total: 55 minutes  Greater than 50%  of this time was spent counseling and coordinating care related to the above assessment and plan.  Signed by: Drue Novel, NP   Please contact Palliative Medicine Team phone at [phone] for questions and concerns.  For individual provider: See Shea Evans

## 2021-12-10 NOTE — Progress Notes (Signed)
Family at bedside during supper and stated that he ate all of supper.

## 2021-12-10 NOTE — Progress Notes (Signed)
Initial Nutrition Assessment  DOCUMENTATION CODES:      INTERVENTION:  Pureed foods as desired  High Protein/High calorie oral supplement as desired   NUTRITION DIAGNOSIS:   Inadequate oral intake related to chronic illness (advanced ALS- severe oropharyngeal dysphagia) as evidenced by per patient/family report, energy intake < or equal to 75% for > or equal to 1 month.   GOAL:   (comfort care focus per patient wishes)   MONITOR:   (Plan of care decisions)  REASON FOR ASSESSMENT:   Malnutrition Screening Tool    ASSESSMENT: Patient is an underweight 88 yo male with hx of advanced ALS who presents with shortness of breath and general malaise. Sepsis/PNA, FTT. Family not pursing aggressive nutrition care. Poor prognosis per MD. Palliative consult pending.  Patient wanting to eat. ST evaluated- severe oropharyngeal dysphagia. Dysphagia 1 with thin liquids (comfort care). Able to feed himself. Patient is not pursing aggressive nutrition care and wishes to go home with Hospice. Plans for discharge 1/24.   Patient ate well last night -100% of dinner per nursing. Family at bedside.   Weight loss 10% x 9 months and ~ 2 kg x 3 months.   Medications reviewed.   Labs: BMP Latest Ref Rng & Units 12/10/2021 12/09/2021 08/31/2020  Glucose 70 - 99 mg/dL 89 148(H) 95  BUN 8 - 23 mg/dL 23 22 19   Creatinine 0.61 - 1.24 mg/dL 0.82 0.84 1.21  BUN/Creat Ratio 10 - 24 - - -  Sodium 135 - 145 mmol/L 142 139 138  Potassium 3.5 - 5.1 mmol/L 3.3(L) 3.3(L) 4.3  Chloride 98 - 111 mmol/L 100 97(L) 105  CO2 22 - 32 mmol/L 35(H) 34(H) 27  Calcium 8.9 - 10.3 mg/dL 8.6(L) 8.5(L) 8.9     NUTRITION - FOCUSED PHYSICAL EXAM:  Unable to complete Nutrition-Focused physical exam at this time. Expect patient malnourished given his insidious weight loss, inability to eat, chronic advanced ALS, BMI-17.36.    Diet Order:   Diet Order             DIET - DYS 1 Room service appropriate? Yes; Fluid  consistency: Thin  Diet effective now                   EDUCATION NEEDS:  Not appropriate for education at this time  Skin:  Skin Assessment: Reviewed RN Assessment  Last BM:  unknown  Height:   Ht Readings from Last 1 Encounters:  12/09/21 6\' 3"  (1.905 m)    Weight:   Wt Readings from Last 1 Encounters:  12/09/21 63 kg    Ideal Body Weight:   89 kg  BMI:  Body mass index is 17.36 kg/m.  Estimated Nutritional Needs:   Kcal:  1800-1900  Protein:  82-88 gr  Fluid:  1.8-1.9 liters daily  Colman Cater MS,RD,CSG,LDN Contact: Shea Evans

## 2021-12-10 NOTE — TOC Initial Note (Signed)
Transition of Care [facility]) - Initial/Assessment Note    Patient Details  Name: [name] MRN: [id number] Date of Birth: 1934/01/21  Transition of Care [facility]) CM/SW Contact:    Shade Flood, LCSW Phone Number: 12/10/2021, 3:14 PM  Clinical Narrative:                  Pt admitted from home. Per MD, pt/family wanting hospice referral and dc home with DME. Spoke with pt's daughter, [name], to review dc planning. [name] confirms that pt desires to return home with hospice. They need hospital bed, suction machine, and walker. Pt has O2 at home but hospice may need to swap out for their DME provider. Reviewed hospice CMS provider options with [name] and referred to [facility] as requested. Cassandra at Hospice states that she will work on DME delivery (likely for tomorrow) and then pt can dc home. Updated MD.  Donella Stade will follow up in AM.  Expected Discharge Plan: [place] Barriers to Discharge: Continued Medical Work up   Patient Goals and CMS Choice Patient states their goals for this hospitalization and ongoing recovery are:: go home with hospice CMS Medicare.gov Compare Post Acute Care list provided to:: Patient Represenative (must comment) Choice offered to / list presented to : Adult Children  Expected Discharge Plan and Services Expected Discharge Plan: Home w Hospice Care In-house Referral: Clinical Social Work   Post Acute Care Choice: Hospice Living arrangements for the past 2 months: [place]                                      Prior Living Arrangements/Services Living arrangements for the past 2 months: Single Family Home Lives with:: Adult Children Patient language and need for interpreter reviewed:: Yes Do you feel safe going back to the place where you live?: Yes      Need for Family Participation in Patient Care: Yes (Comment) Care giver support system in place?: Yes (comment) Current home services: DME Criminal  Activity/Legal Involvement Pertinent to Current Situation/Hospitalization: No - Comment as needed  Activities of Daily Living Home Assistive Devices/Equipment: Dentures (specify type), Cane (specify quad or straight), Hearing aid, Oxygen, Walker (specify type) ADL Screening (condition at time of admission) Patient's cognitive ability adequate to safely complete daily activities?: Yes Is the patient deaf or have difficulty hearing?: Yes Does the patient have difficulty seeing, even when wearing glasses/contacts?: No Does the patient have difficulty concentrating, remembering, or making decisions?: Yes Patient able to express need for assistance with ADLs?: Yes Does the patient have difficulty dressing or bathing?: No Independently performs ADLs?: Yes (appropriate for developmental age) Does the patient have difficulty walking or climbing stairs?: Yes Weakness of Legs: Both Weakness of Arms/Hands: Both  Permission Sought/Granted Permission sought to share information with : Chartered certified accountant granted to share information with : Yes, Verbal Permission Granted     Permission granted to share info w AGENCY: hospice        Emotional Assessment       Orientation: : Oriented to Self, Oriented to Place, Oriented to  Time, Oriented to Situation Alcohol / Substance Use: Not Applicable Psych Involvement: No (comment)  Admission diagnosis:  Hypokalemia [E87.6] ALS (amyotrophic lateral sclerosis) (HCC) [G12.21] Hypoxia [R09.02] Inability to swallow [R13.0] Acute respiratory failure with hypoxia (HCC) [J96.01] Normochromic normocytic anemia [D64.9] Community acquired pneumonia, unspecified laterality [J18.9] Patient  Active Problem List   Diagnosis Date Noted   Acute respiratory failure with hypoxia ([place]) 12/09/2021   Hypertension 02/20/2021   Paroxysmal atrial fibrillation (HCC) 02/20/2021   VT (ventricular tachycardia) 02/20/2021   Dysarthria 07/04/2020   PBP  (progressive bulbar palsy) ([place]) 05/30/2020   Erectile dysfunction 01/21/2020   PVC's (premature ventricular contractions) 12/24/2012   CARDIOMYOPATHY 76/54/6503   CHRONIC SYSTOLIC HEART FAILURE 54/65/6812   Automatic implantable cardioverter-defibrillator in situ 12/19/2009   PCP:  Manon Hilding, MD Pharmacy:   [facility], [address] [place] [address] Phone: [phone] Fax: [phone]     Social Determinants of Health (SDOH) Interventions    Readmission Risk Interventions No flowsheet data found.

## 2021-12-11 DIAGNOSIS — G1221 Amyotrophic lateral sclerosis: Secondary | ICD-10-CM

## 2021-12-11 DIAGNOSIS — J690 Pneumonitis due to inhalation of food and vomit: Secondary | ICD-10-CM | POA: Diagnosis not present

## 2021-12-11 DIAGNOSIS — I472 Ventricular tachycardia, unspecified: Secondary | ICD-10-CM

## 2021-12-11 DIAGNOSIS — E039 Hypothyroidism, unspecified: Secondary | ICD-10-CM

## 2021-12-11 DIAGNOSIS — I428 Other cardiomyopathies: Secondary | ICD-10-CM

## 2021-12-11 DIAGNOSIS — E876 Hypokalemia: Secondary | ICD-10-CM

## 2021-12-11 DIAGNOSIS — R6520 Severe sepsis without septic shock: Secondary | ICD-10-CM

## 2021-12-11 DIAGNOSIS — J9601 Acute respiratory failure with hypoxia: Secondary | ICD-10-CM | POA: Diagnosis not present

## 2021-12-11 DIAGNOSIS — A419 Sepsis, unspecified organism: Secondary | ICD-10-CM

## 2021-12-11 MED ORDER — MORPHINE SULFATE (CONCENTRATE) 20 MG/ML PO SOLN: 10.0000 mg | ORAL | 0 refills | Status: DC | PRN

## 2021-12-11 MED ORDER — SCOPOLAMINE 1 MG/3DAYS TD PT72: 1.0000 | MEDICATED_PATCH | TRANSDERMAL | 12 refills | Status: DC

## 2021-12-11 MED ORDER — HALOPERIDOL LACTATE 5 MG/ML IJ SOLN
2.0000 mg | Freq: Once | INTRAMUSCULAR | Status: AC
  Administered 2021-12-11: 04:00:00 2 mg via INTRAVENOUS
  Filled 2021-12-11: qty 1

## 2021-12-11 MED ORDER — COMBIVENT RESPIMAT 20-100 MCG/ACT IN AERS: 1.0000 | INHALATION_SPRAY | Freq: Four times a day (QID) | RESPIRATORY_TRACT | 1 refills | Status: DC | PRN

## 2021-12-11 MED ORDER — AMOXICILLIN-POT CLAVULANATE 250-62.5 MG/5ML PO SUSR: 875.0000 mg | Freq: Two times a day (BID) | ORAL | 0 refills | Status: AC

## 2021-12-11 MED ORDER — LORAZEPAM 2 MG/ML IJ SOLN
0.5000 mg | Freq: Once | INTRAMUSCULAR | Status: AC
  Administered 2021-12-11: 02:00:00 0.5 mg via INTRAVENOUS
  Filled 2021-12-11: qty 1

## 2021-12-11 NOTE — Progress Notes (Signed)
Pts daughter Inez Catalina given discharge instructions and hard copy. Printed prescription given as well. Pts daughter has no further questions at this time, neither does the pt. Awaiting social worker to inform me of bed being delievered to pts residence and social worker will then call EMS for transport.

## 2021-12-11 NOTE — TOC Transition Note (Signed)
Transition of Care [facility]) - CM/SW Discharge Note   Patient Details  Name: [name] MRN: [id number] Date of Birth: 09-Apr-1934  Transition of Care [facility]) CM/SW Contact:  Shade Flood, LCSW Phone Number: 12/11/2021, 1:01 PM   Clinical Narrative:     Pt stable for dc home with hospice today per MD. DME has been delivered to pt's home. Daughter Inez Catalina at bedside and updated by RN on dc. TOC arranged EMS transport.   There are no other TOC needs for dc.  Final next level of care: Home w Hospice Care Barriers to Discharge: Barriers Resolved   Patient Goals and CMS Choice Patient states their goals for this hospitalization and ongoing recovery are:: go home with hospice CMS Medicare.gov Compare Post Acute Care list provided to:: Patient Represenative (must comment) Choice offered to / list presented to : Adult Children  Discharge Placement                Patient to be transferred to facility by: EMS Name of family member notified: Inez Catalina Patient and family notified of of transfer: 12/11/21  Discharge Plan and Services In-house Referral: Clinical Social Work   Post Acute Care Choice: Hospice                               Social Determinants of Health (SDOH) Interventions     Readmission Risk Interventions No flowsheet data found.

## 2021-12-11 NOTE — Discharge Summary (Signed)
Physician Discharge Summary  [name] MBW:[id number] DOB: [birth year] DOA: 12/09/2021  PCP: Manon Hilding, MD  Admit date: 12/09/2021 Discharge date: 12/11/2021  Time spent: 35 minutes  Recommendations for Outpatient Follow-up:  Follow-up with PCP in 1 week Symptomatic management and comfort care by hospice at home. No future hospitalization.  Discharge Diagnoses:  Principal Problem:   Acute respiratory failure with hypoxia (HCC) Active Problems:   Non-ischemic cardiomyopathy (HCC)   Ventricular tachycardia   ALS (amyotrophic lateral sclerosis) (HCC)   Hypokalemia   Aspiration pneumonia of right lower lobe due to gastric secretions (HCC)   Sepsis with acute hypoxic respiratory failure without septic shock (HCC)   Hypothyroidism Failure to thrive/severe protein calorie malnutrition.  Discharge Condition: Stable overall; discharged home with home hospice.  Code status: DNR/DNI  Diet recommendation: dysphagia 1 with thin liquids.   Filed Weights   12/09/21 0654  Weight: 63 kg    History of present illness:  [name] is a 88 y.o. male with medical history significant of advanced ALS, nonischemic cardiomyopathy, history of nonobstructive coronary artery disease, history of ventricular tachycardia status post implantable defibrillator, hypothyroidism, BPH and hypertension; presented to the hospital secondary to general malaise, shortness of breath, intermittent coughing spells and lower extremity swelling.  Patient symptoms have been present for the last 3-4 days and worsening.  At this moment no longer able to take his oral meds at home and experiencing significant difficulty swallowing.  Patient denies fever, no chest pain, no dysuria, no hematuria, no focal weakness or sick contacts. Patient has expressed very clear and no heroic or  aggressive interventions.  Expressed to be ready to die if it is time would like to set up hospice at home.   Patient is now vaccinated  against COVID; COVID PCR in the ED negative.   ED Course: Work-up demonstrating hypoxia of 87% on room air, chronically ill and severe protein calorie malnutrition in appearance; increase upper respiratory symptoms and gargling, constantly expectorating and using suction device intermittently.  She has a great with positive right lower lobe pneumonia and vascular congestion; elevated WBCs and mild hypokalemia.   Hospital Course:  1-sepsis secondary to pneumonia -Patient had made criteria for sepsis at time of admission with elevated WBCs, tachypnea, hypoxia and positive source of infection found on x-rays (right lower lobe pneumonia). -Appears to be all secondary to aspiration given advanced ALS. -Patient has responded and feeling better; after discussing with family and patient plan is to start dysphagia 1 diet with thin liquids understanding ongoing high risk of aspiration. -Will transition to Augmentin at time of discharge to finish acute treatment. -plan of care is to focus on comfort and symptomatic management.  -will discharge home with hospice. -DME equipment requested includes suction device, oxygen supplementation on hospital bed.   2-acute respiratory failure with hypoxia -In the setting of problem #1 -Oxygen supplementation will be provided at time of discharge.   3-nonischemic cardiomyopathy/chronic systolic heart failure /ventricular tachycardia -Resume oral medications as long as he is able to take them. -Continue the use of TED hoses -Significant improvement appreciated in his lower extremity swelling with application of TED hoses, suggesting third space shifting and venous stasis and not vascular congestion. -Patient intravascularly dry on examination. -Plan is for hospice at home. -systolic HF compensated.   4-increased upper airway secretions -Continue the use of a scopolamine patch. -morphine for air hunger and SOB prescribed at discharge.   5-failure to thrive/severe  protein calorie malnutrition -Per family  reports patient has lost over 50 pounds. -Cachectic and significantly underweight on examination. -Body mass index is 17.36 kg/m. -Patient had expressed very clear no heroic measures, invasive treatment no artificial feeding.   6-DNR/DNI -CODE STATUS has been reviewed and confirmed with patient and family member -Will discharge home with hospice. -Prognosis is very poor.   7-hypokalemia -In the setting of poor oral intake -electrolytes repleted -no future blood draws anticipated.  Procedures: See below for x-ray reports.  Consultations: Palliative care  Discharge Exam: Vitals:   12/10/21 2229 12/11/21 0759  BP: (!) 151/81   Pulse: 60   Resp: 20   Temp:    SpO2: 100% 92%    General: Afebrile, denies chest pain, no nausea or vomiting.  Still with ongoing speech difficulties, appreciated upper airway secretions and difficulty swallowing.  Patient expressed feeling better overall.  Good saturation with oxygen supplementation. Cardiovascular: rate controlled, no rubs, no JVD. Left chest with AICD in place. Respiratory: positive rhonchi, no wheezing, improved air movement, no using accessory muscles.  Discharge Instructions    Allergies as of 12/11/2021       Reactions   Eliquis [apixaban] Swelling   Facial swelling/redness        Medication List     STOP taking these medications    aspirin EC 81 MG tablet   losartan 25 MG tablet Commonly known as: COZAAR       TAKE these medications    amiodarone 200 MG tablet Commonly known as: PACERONE Take 1 tablet (200 mg total) by mouth daily.   amoxicillin-clavulanate 250-62.5 MG/5ML suspension Commonly known as: AUGMENTIN Take 17.5 mLs (875 mg total) by mouth 2 (two) times daily for 5 days.   Combivent Respimat 20-100 MCG/ACT Aers respimat Generic drug: Ipratropium-Albuterol Inhale 1 puff into the lungs every 6 (six) hours as needed for wheezing or shortness of  breath.   finasteride 5 MG tablet Commonly known as: PROSCAR Take 1 tablet by mouth daily.   levothyroxine 50 MCG tablet Commonly known as: SYNTHROID Take 50 mcg by mouth daily.   metoprolol tartrate 25 MG tablet Commonly known as: LOPRESSOR Take 25 mg by mouth 2 (two) times daily.   morphine 20 MG/ML concentrated solution Commonly known as: ROXANOL Take 0.5 mLs (10 mg total) by mouth every 4 (four) hours as needed for severe pain or shortness of breath.   scopolamine 1 MG/3DAYS Commonly known as: TRANSDERM-SCOP Place 1 patch (1.5 mg total) onto the skin every 3 (three) days. Start taking on: December 12, 2021   tamsulosin 0.4 MG Caps capsule Commonly known as: FLOMAX Take 0.4 mg by mouth daily.               Durable Medical Equipment  (From admission, onward)           Start     Ordered   12/11/21 0000  For home use only DME oxygen       Question Answer Comment  Length of Need 6 Months   Mode or (Route) Nasal cannula   Liters per Minute 3   Frequency Continuous (stationary and portable oxygen unit needed)   Oxygen conserving device Yes   Oxygen delivery system Gas      12/11/21 1121           Allergies  Allergen Reactions   Eliquis [Apixaban] Swelling    Facial swelling/redness    Follow-up Information     Sasser, Silvestre Moment, MD. Schedule an appointment as soon as possible  for a visit in 1 week(s).   Specialty: Family Medicine Contact information: [place] [address] [phone]                 The results of significant diagnostics from this hospitalization (including imaging, microbiology, ancillary and laboratory) are listed below for reference.    Significant Diagnostic Studies: DG Chest Port 1 View  Result Date: 12/09/2021 CLINICAL DATA:  Weakness.  Frequent falls. EXAM: PORTABLE CHEST 1 VIEW COMPARISON:  08/15/2020 FINDINGS: Left chest wall ICD noted with leads in the right atrial appendage and right ventricle.  Stable cardiomediastinal contours. Bilateral pleural effusions and mild diffuse interstitial edema noted concerning for CHF. There is diminished aeration to both lung bases which may represent areas of atelectasis or airspace consolidation. Right upper lobe airspace opacity is noted which may reflect pneumonia. IMPRESSION: 1. CHF. 2. Right upper lobe airspace opacity concerning for pneumonia. 3. Low lung volumes. Atelectasis versus airspace consolidation in both lung bases. Electronically Signed   By: Kerby Moors M.D.   On: 12/09/2021 07:36    Microbiology: Recent Results (from the past 240 hour(s))  Resp Panel by RT-PCR (Flu A&B, Covid) Nasopharyngeal Swab     Status: None   Collection Time: 12/09/21  7:07 AM   Specimen: Nasopharyngeal Swab; Nasopharyngeal(NP) swabs in vial transport medium  Result Value Ref Range Status   SARS Coronavirus 2 by RT PCR NEGATIVE NEGATIVE Final    Comment: (NOTE) SARS-CoV-2 target nucleic acids are NOT DETECTED.  The SARS-CoV-2 RNA is generally detectable in upper respiratory specimens during the acute phase of infection. The lowest concentration of SARS-CoV-2 viral copies this assay can detect is 138 copies/mL. A negative result does not preclude SARS-Cov-2 infection and should not be used as the sole basis for treatment or other patient management decisions. A negative result may occur with  improper specimen collection/handling, submission of specimen other than nasopharyngeal swab, presence of viral mutation(s) within the areas targeted by this assay, and inadequate number of viral copies(<138 copies/mL). A negative result must be combined with clinical observations, patient history, and epidemiological information. The expected result is Negative.  Fact Sheet for Patients:  EntrepreneurPulse.com.au  Fact Sheet for Healthcare Providers:  IncredibleEmployment.be  This test is no t yet approved or cleared by the  [place] FDA and  has been authorized for detection and/or diagnosis of SARS-CoV-2 by FDA under an Emergency Use Authorization (EUA). This EUA will remain  in effect (meaning this test can be used) for the duration of the COVID-19 declaration under Section 564(b)(1) of the Act, 21 U.S.C.section 360bbb-3(b)(1), unless the authorization is terminated  or revoked sooner.       Influenza A by PCR NEGATIVE NEGATIVE Final   Influenza B by PCR NEGATIVE NEGATIVE Final    Comment: (NOTE) The Xpert Xpress SARS-CoV-2/FLU/RSV plus assay is intended as an aid in the diagnosis of influenza from Nasopharyngeal swab specimens and should not be used as a sole basis for treatment. Nasal washings and aspirates are unacceptable for Xpert Xpress SARS-CoV-2/FLU/RSV testing.  Fact Sheet for Patients: EntrepreneurPulse.com.au  Fact Sheet for Healthcare Providers: IncredibleEmployment.be  This test is not yet approved or cleared by the [place] FDA and has been authorized for detection and/or diagnosis of SARS-CoV-2 by FDA under an Emergency Use Authorization (EUA). This EUA will remain in effect (meaning this test can be used) for the duration of the COVID-19 declaration under Section 564(b)(1) of the Act, 21 U.S.C. section 360bbb-3(b)(1), unless  the authorization is terminated or revoked.  Performed at [facility], [address] [place], [place] [address]   Culture, blood (routine x 2) Call MD if unable to obtain prior to antibiotics being given     Status: None (Preliminary result)   Collection Time: 12/09/21  8:33 AM   Specimen: Right Antecubital; Blood  Result Value Ref Range Status   Specimen Description   Final    RIGHT ANTECUBITAL BOTTLES DRAWN AEROBIC AND ANAEROBIC   Special Requests   Final    Blood Culture results may not be optimal due to an excessive volume of blood received in culture bottles   Culture   Final    NO GROWTH 2  DAYS Performed at [facility], [address], [place], [place] [address]    Report Status PENDING  Incomplete  Culture, blood (routine x 2) Call MD if unable to obtain prior to antibiotics being given     Status: None (Preliminary result)   Collection Time: 12/09/21  8:42 AM   Specimen: BLOOD RIGHT ARM  Result Value Ref Range Status   Specimen Description   Final    BLOOD RIGHT ARM BOTTLES DRAWN AEROBIC AND ANAEROBIC   Special Requests Blood Culture adequate volume  Final   Culture   Final    NO GROWTH 2 DAYS Performed at [facility], [address], [place],  [address]    Report Status PENDING  Incomplete     Labs: Basic Metabolic Panel: Recent Labs  Lab 12/09/21 0707 12/10/21 0440  NA 139 142  K 3.3* 3.3*  CL 97* 100  CO2 34* 35*  GLUCOSE 148* 89  BUN 22 23  CREATININE 0.84 0.82  CALCIUM 8.5* 8.6*  MG 1.8  --   PHOS 3.2  --    Liver Function Tests: Recent Labs  Lab 12/09/21 0707  AST 21  ALT 28  ALKPHOS 65  BILITOT 1.2  PROT 5.9*  ALBUMIN 2.6*   CBC: Recent Labs  Lab 12/09/21 0707 12/10/21 0440  WBC 17.2* 9.8  NEUTROABS 16.2*  --   HGB 11.7* 10.8*  HCT 37.7* 36.4*  MCV 94.7 96.0  PLT 322 355   Signed:  Barton Dubois MD.  [facility] 12/11/2021, 9:28 AM

## 2021-12-15 LAB — CULTURE, BLOOD (ROUTINE X 2)
Culture: NO GROWTH
Culture: NO GROWTH
Special Requests: ADEQUATE

## 2021-12-19 DEATH — deceased

## 2022-07-03 IMAGING — DX DG CHEST 1V PORT
1 series · 1 of 1 positions shown · non-contrast
Comparison: 08/15/2020

CLINICAL DATA: Weakness.  Frequent falls.

EXAM:
PORTABLE CHEST 1 VIEW

[chest ap]
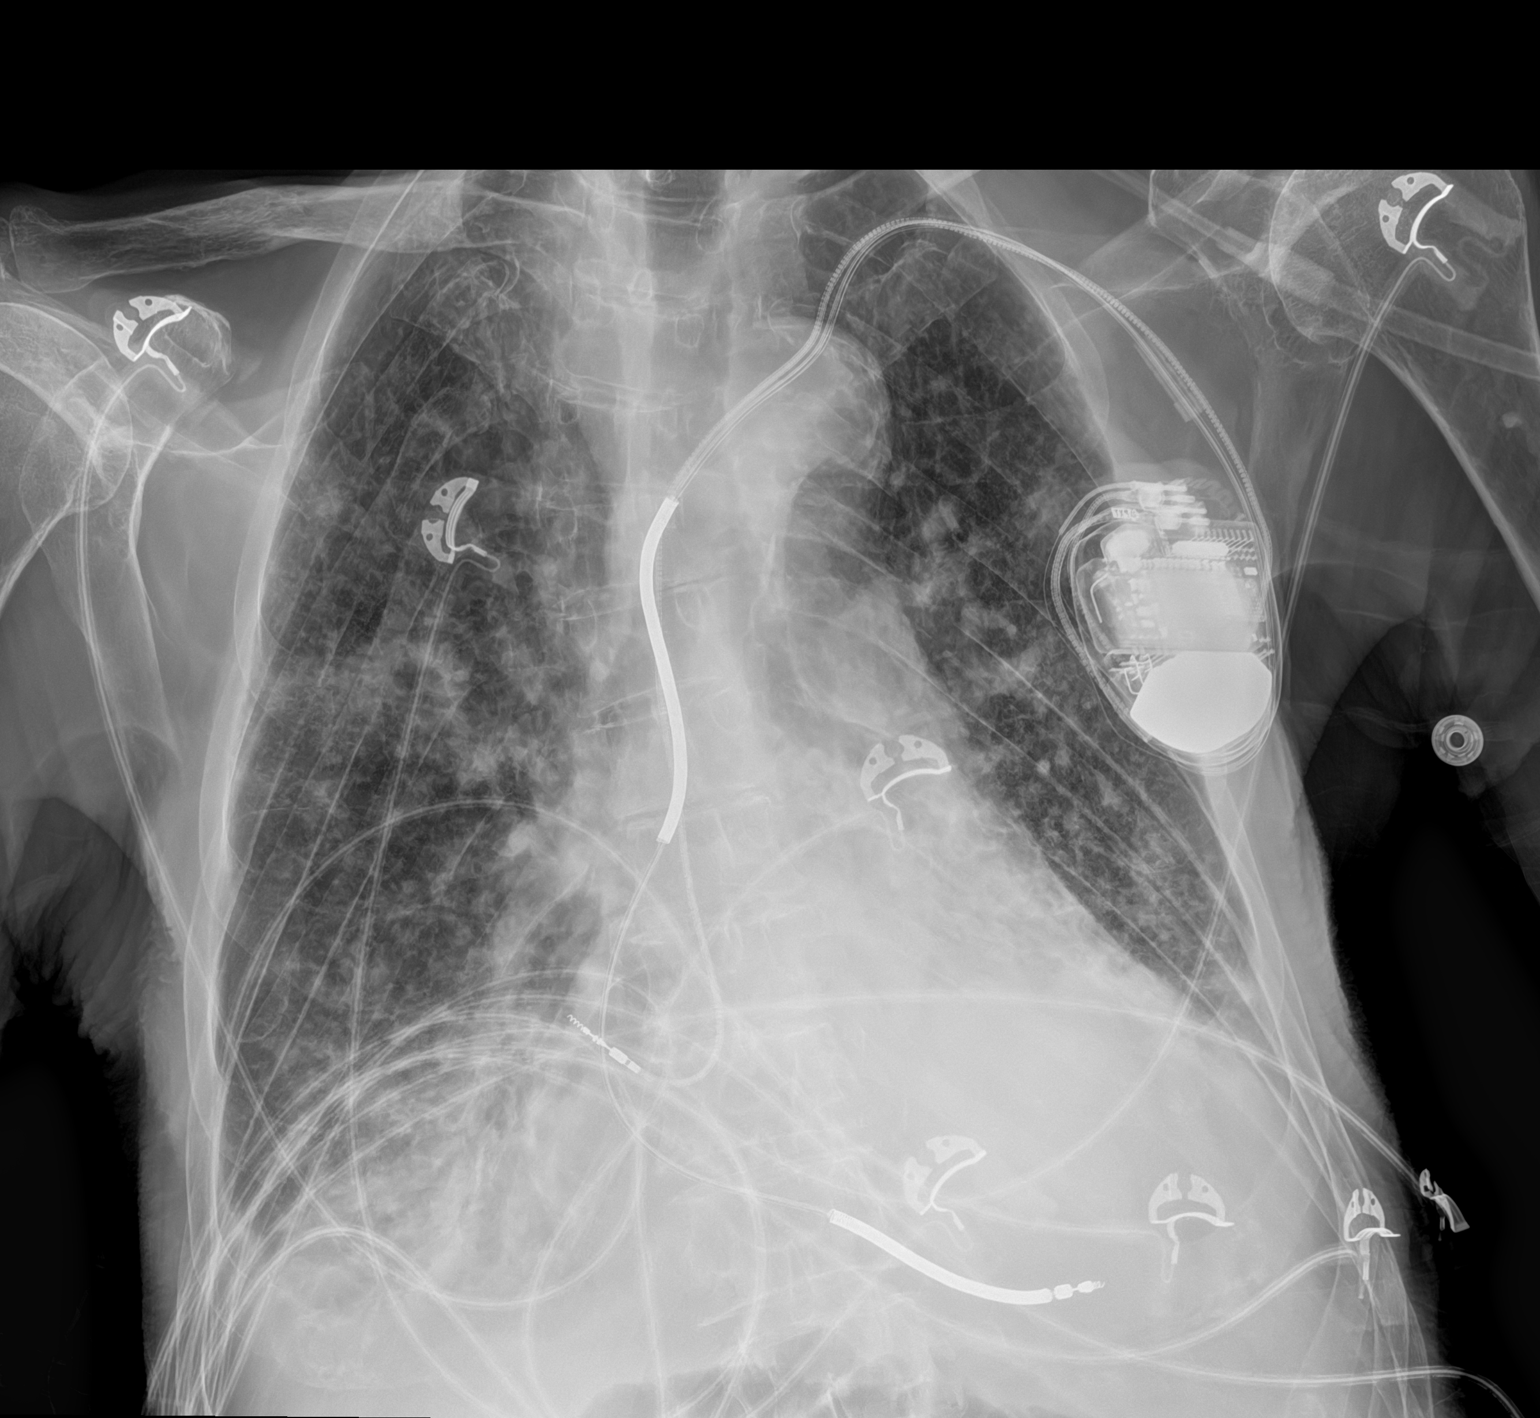

[1 of 1 positions shown; findings below may reference images not displayed]

FINDINGS: Left chest wall ICD noted with leads in the right atrial appendage
and right ventricle. Stable cardiomediastinal contours. Bilateral
pleural effusions and mild diffuse interstitial edema noted
concerning for CHF. There is diminished aeration to both lung bases
which may represent areas of atelectasis or airspace consolidation.
Right upper lobe airspace opacity is noted which may reflect
pneumonia.
IMPRESSION: 1. CHF.
2. Right upper lobe airspace opacity concerning for pneumonia.
3. Low lung volumes. Atelectasis versus airspace consolidation in
both lung bases.
